# Patient Record
Sex: Male | Born: 1980 | Race: Black or African American | Hispanic: No | Marital: Single | Smoking: Current every day smoker
Health system: Southern US, Community
[De-identification: ages and names within clinical notes are randomized; demographics above are authoritative.]

## PROBLEM LIST (undated history)

## (undated) DIAGNOSIS — F209 Schizophrenia, unspecified: Secondary | ICD-10-CM

## (undated) HISTORY — PX: OTHER SURGICAL HISTORY: SHX169

---

## 2006-05-18 ENCOUNTER — Emergency Department (HOSPITAL_COMMUNITY): Admission: EM | Admit: 2006-05-18 | Discharge: 2006-05-18 | Payer: Self-pay | Admitting: Emergency Medicine

## 2010-03-13 ENCOUNTER — Emergency Department (HOSPITAL_COMMUNITY)
Admission: EM | Admit: 2010-03-13 | Discharge: 2010-03-14 | Disposition: A | Discharge status: Home / Self Care | Payer: Self-pay | Attending: Emergency Medicine | Admitting: Emergency Medicine

## 2010-03-13 DIAGNOSIS — H5500 Unspecified nystagmus: Secondary | ICD-10-CM | POA: Insufficient documentation

## 2010-03-13 DIAGNOSIS — T43505A Adverse effect of unspecified antipsychotics and neuroleptics, initial encounter: Secondary | ICD-10-CM | POA: Insufficient documentation

## 2010-03-13 DIAGNOSIS — F209 Schizophrenia, unspecified: Secondary | ICD-10-CM | POA: Insufficient documentation

## 2010-03-13 DIAGNOSIS — Z982 Presence of cerebrospinal fluid drainage device: Secondary | ICD-10-CM | POA: Insufficient documentation

## 2010-03-13 DIAGNOSIS — T443X5A Adverse effect of other parasympatholytics [anticholinergics and antimuscarinics] and spasmolytics, initial encounter: Secondary | ICD-10-CM | POA: Insufficient documentation

## 2010-03-13 DIAGNOSIS — R259 Unspecified abnormal involuntary movements: Secondary | ICD-10-CM | POA: Insufficient documentation

## 2010-03-13 DIAGNOSIS — G259 Extrapyramidal and movement disorder, unspecified: Secondary | ICD-10-CM | POA: Insufficient documentation

## 2010-03-14 LAB — POCT I-STAT, CHEM 8
Calcium, Ion: 1.07 mmol/L — ABNORMAL LOW (ref 1.12–1.32)
Creatinine, Ser: 0.9 mg/dL (ref 0.4–1.5)
Glucose, Bld: 102 mg/dL — ABNORMAL HIGH (ref 70–99)
Hemoglobin: 14.6 g/dL (ref 13.0–17.0)
TCO2: 24 mmol/L (ref 0–100)

## 2010-03-15 ENCOUNTER — Emergency Department (HOSPITAL_COMMUNITY)
Admission: EM | Admit: 2010-03-15 | Discharge: 2010-03-16 | Disposition: A | Discharge status: Other Acute Inpatient Hospital | Payer: Self-pay | Attending: Emergency Medicine | Admitting: Emergency Medicine

## 2010-03-15 DIAGNOSIS — F200 Paranoid schizophrenia: Secondary | ICD-10-CM

## 2010-03-15 DIAGNOSIS — F2081 Schizophreniform disorder: Secondary | ICD-10-CM

## 2010-03-15 DIAGNOSIS — F209 Schizophrenia, unspecified: Secondary | ICD-10-CM | POA: Insufficient documentation

## 2010-03-15 DIAGNOSIS — R269 Unspecified abnormalities of gait and mobility: Secondary | ICD-10-CM | POA: Insufficient documentation

## 2010-03-15 DIAGNOSIS — I10 Essential (primary) hypertension: Secondary | ICD-10-CM | POA: Insufficient documentation

## 2010-03-15 DIAGNOSIS — R259 Unspecified abnormal involuntary movements: Secondary | ICD-10-CM | POA: Insufficient documentation

## 2010-03-15 LAB — URINALYSIS, ROUTINE W REFLEX MICROSCOPIC
Bilirubin Urine: NEGATIVE
Nitrite: NEGATIVE
Protein, ur: NEGATIVE mg/dL
Specific Gravity, Urine: 1.019 (ref 1.005–1.030)
Urobilinogen, UA: 1 mg/dL (ref 0.0–1.0)

## 2010-03-15 LAB — RAPID URINE DRUG SCREEN, HOSP PERFORMED
Amphetamines: NOT DETECTED
Opiates: NOT DETECTED
Tetrahydrocannabinol: NOT DETECTED

## 2010-03-15 LAB — DIFFERENTIAL
Basophils Absolute: 0.1 10*3/uL (ref 0.0–0.1)
Basophils Relative: 1 % (ref 0–1)
Eosinophils Relative: 2 % (ref 0–5)
Monocytes Absolute: 0.7 10*3/uL (ref 0.1–1.0)
Monocytes Relative: 9 % (ref 3–12)

## 2010-03-15 LAB — BASIC METABOLIC PANEL
Calcium: 9 mg/dL (ref 8.4–10.5)
Creatinine, Ser: 1 mg/dL (ref 0.4–1.5)
GFR calc Af Amer: >60 mL/min (ref >60)
GFR calc non Af Amer: >60 mL/min (ref >60)
Glucose, Bld: 94 mg/dL (ref 70–99)
Sodium: 138 mEq/L (ref 135–145)

## 2010-03-15 LAB — CBC
MCH: 26.4 pg (ref 26.0–34.0)
MCHC: 36.3 g/dL — ABNORMAL HIGH (ref 30.0–36.0)
RDW: 15.5 % (ref 11.5–15.5)

## 2010-03-16 ENCOUNTER — Inpatient Hospital Stay (HOSPITAL_COMMUNITY)
Admission: AD | Admit: 2010-03-16 | Discharge: 2010-03-19 | DRG: 885 | Disposition: A | Discharge status: Home / Self Care | Payer: PRIVATE HEALTH INSURANCE | Source: Ambulatory Visit | Attending: Psychiatry | Admitting: Psychiatry

## 2010-03-16 DIAGNOSIS — G2401 Drug induced subacute dyskinesia: Secondary | ICD-10-CM

## 2010-03-16 DIAGNOSIS — D649 Anemia, unspecified: Secondary | ICD-10-CM

## 2010-03-16 DIAGNOSIS — F205 Residual schizophrenia: Principal | ICD-10-CM

## 2010-03-16 DIAGNOSIS — G911 Obstructive hydrocephalus: Secondary | ICD-10-CM

## 2010-03-16 DIAGNOSIS — T43505A Adverse effect of unspecified antipsychotics and neuroleptics, initial encounter: Secondary | ICD-10-CM

## 2010-03-17 DIAGNOSIS — F209 Schizophrenia, unspecified: Secondary | ICD-10-CM

## 2010-03-25 NOTE — H&P (Signed)
[name], GENET NO.:  [id number]  MEDICAL RECORD NO.:  [id number]           PATIENT TYPE:  I  LOCATION:  0406                          FACILITY:  [facility]  PHYSICIAN:  Eulogio Ditch, MD DATE OF BIRTH:  [birth year]  DATE OF ADMISSION:  03/16/2010 DATE OF DISCHARGE:                      PSYCHIATRIC ADMISSION ASSESSMENT   IDENTIFYING INFORMATION:  A 30 year old male, single.  This is a voluntary admission.  HISTORY OF PRESENT ILLNESS:  .  First [facility] admission for [name] who was brought to the emergency room by his father who was concerned about twitching movements that he had been having for several days.  It is unclear exactly when it started but he has had some dystonia in the past on his current Risperdal and has responded well to Ativan.  He apparently had stopped taking the Ativan a couple weeks ago and then at this time he has twitching and jittering movements.  His father has been very concerned about permanent tardive dyskinesia.  Murice himself was complaining of being very hungry and has a mild headache which resolved in the emergency room with food.  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  PAST PSYCHIATRIC HISTORY:  Currently followed as an outpatient for schizophrenia at [facility].  He has a history of previous admission to [facility] in January 2004.  SOCIAL HISTORY:  Single African American male. Never married.  No children.  Lives with supportive parents and siblings who also monitor his medications.  FAMILY HISTORY:  Not available.  ALCOHOL AND DRUG HISTORY:  No evidence of substance abuse.  MEDICAL HISTORY:  Primary care physician is unknown.  MEDICAL PROBLEMS:  History of hydrocephalus with a VP shunt last known to be revised around 1997, per the record.  CURRENT MEDICATIONS:  Risperdal 4 mg and Cogentin 1 mg daily.  DRUG ALLERGIES:  None.  PHYSICAL EXAM:  Was done  in the emergency room and noted in the record. There was no evidence of shunt problems when he was examined. Today his AIM score is zero.  His balance is good even with challenge.  He is somewhat unsteady in tandem gait. Strength exam reveals equal grip. No rigidity.  No cogwheeling.  Proprioception is intact. Repetitive movements are smooth and intact.  Romberg is without findings.  Neuro is nonfocal.  His full physical exam was done at [facility] emergency room.  This is a slight built [place] American male 5 feet 10 inches tall, 65 kg.  CBC: WBC 7.2, hemoglobin 13.6, hematocrit 37.5, platelets 317,000. MCV is 72.8.  Electrolytes are normal. BUN 9, creatinine 1.0, negative urine drug screen and urinalysis.  MENTAL STATUS EXAM:  This is a slightly drowsy male whom I greeted in the day room where he was watching TV.  He said his leg was asleep and initially he limped a little bit so we sat and talked for a few minutes and cognitively he is fully oriented.  Speech is nonpressured.  He asked if he was going to be going home on his current Ativan and Risperdal combination.  Questions are appropriate.  Thinking is relatively logical.  He is oriented to person, place and situation.  No dangerous thoughts and no abnormal movements noted today. AXIS I:  Rule out schizophrenia. AXIS II:  No diagnosis. AXIS III:  Microcytic anemia. AXIS IV:  Deferred.  Stable home and supportive family is an asset. AXIS V:  Current is 32. Past year not known.  PLAN:  Admit him voluntarily to our stabilization unit.  We will get some additional history from his family before we proceed with an anemia panel.  This appears to be a chronic finding.  Meanwhile we have started him on Ativan and will give him 0.5 mg t.i.d. now that the 1 mg t.i.d. appears to be causing a little bit of drowsiness. His parents are coming today to visit and we will hope to get some feedback from them.     Margaret A. Lorin Picket,  N.P.   ______________________________ Eulogio Ditch, MD    MAS/MEDQ  D:  03/17/2010  T:  03/17/2010  Job:  [id number]  Electronically Signed by Kari Baars N.P. on 03/19/2010 02:28:39 PM Electronically Signed by Eulogio Ditch  on 03/25/2010 06:07:43 AM

## 2010-03-28 NOTE — Discharge Summary (Signed)
[name], [name]                ACCOUNT NO.:  [id number]  MEDICAL RECORD NO.:  [id number]           PATIENT TYPE:  I  LOCATION:  0400                          FACILITY:  [facility]  PHYSICIAN:  Eulogio Ditch, MD DATE OF BIRTH:  [birth year]  DATE OF ADMISSION:  03/16/2010 DATE OF DISCHARGE:  03/19/2010                              DISCHARGE SUMMARY   IDENTIFYING INFORMATION:  This is a 30 year old single male.  This is a voluntary admission.  HISTORY OF PRESENT ILLNESS:  First [facility] admission for [name], who was brought to the emergency room by his father, who was concerned about twitching movements that he had for several days.  It is unclear exactly when it started, but he reports a problem with some dystonia in the past, and his father was concerned that he might be displaying symptoms of permanent tardive dyskinesia.  He has a history of schizophrenia and hydrocephalous with a VP shunt last revised in or around 1997.  He has had some dystonia in the past which had responded well to Ativan.  He is currently followed as an outpatient by [facility]. Medical evaluation, diagnostic studies, full physical exam was done in the emergency room, where consult was done by our attending psychiatrist, Dr. Eulogio Ditch.  He was noted to have some nystagmus with gaze shift in the left eye and occasional rapid twitching of his hands and feet.  He was started on Ativan in the emergency room and responded well.  He was transferred to [facility] for observation.  Admitting mental status exam revealed a fully alert male, cooperative, though rather involutional.  He was oriented to person, place, and situation.  He responded to all questions asked and was cooperative but did not offer much information.  Speech is soft in tone, barely audible at times.  He was cooperative with the neuro exam.  He was noted to be mildly drowsy after having been started on Ativan 1  mg t.i.d. and had received a dose about 15 minutes prior to the exam.  Up and ambulatory with no abnormal movements.  No cogwheeling.  No motor stiffness.  Proprioception intact.  Rapid alternating movements intact. No signs of tremor or dystonia.  __________ is 0.  COURSE OF HOSPITALIZATION:  He was admitted to our stabilization unit, and we decreased the Ativan to 0.5 mg b.i.d. and continued his home medication of risperidone 4 mg daily.  We discontinued the Cogentin in order to alleviate any possible dystonia.  While here on the unit he was alert, cooperative, good appetite, slept well, and had no further signs of usual movement.  He was discharged to return home with his parents on February 13.  DISCHARGE/PLAN:  Follow up at the [facility] on February 16 at 9:00 a.m.  DISCHARGE MEDICATIONS:  Risperdal 4 mg p.o. daily and lorazepam 0.5 mg twice daily and at bedtime.  He was instructed to discontinue the Cogentin.  DISCHARGE DIAGNOSES:  Schizophrenia, chronic.  AXIS II:  No diagnosis. AXIS III:  No diagnosis.  AXIS IV:  Deferred.  AXIS V:  Current is 58, past year not  known.  DISCHARGE CONDITION:  Stable.     Margaret A. Lorin Picket, N.P.   ______________________________ Eulogio Ditch, MD    MAS/MEDQ  D:  03/20/2010  T:  03/20/2010  Job:  [id number]  Electronically Signed by Kari Baars N.P. on 03/26/2010 12:09:02 PM Electronically Signed by Eulogio Ditch  on 03/28/2010 04:47:55 AM

## 2010-08-24 ENCOUNTER — Emergency Department (HOSPITAL_COMMUNITY)
Admission: EM | Admit: 2010-08-24 | Discharge: 2010-08-25 | Disposition: A | Discharge status: Other Acute Inpatient Hospital | Payer: Self-pay | Attending: Emergency Medicine | Admitting: Emergency Medicine

## 2010-08-24 DIAGNOSIS — R4182 Altered mental status, unspecified: Secondary | ICD-10-CM | POA: Insufficient documentation

## 2010-08-24 DIAGNOSIS — F29 Unspecified psychosis not due to a substance or known physiological condition: Secondary | ICD-10-CM | POA: Insufficient documentation

## 2010-08-24 DIAGNOSIS — IMO0002 Reserved for concepts with insufficient information to code with codable children: Secondary | ICD-10-CM | POA: Insufficient documentation

## 2010-08-24 DIAGNOSIS — Z79899 Other long term (current) drug therapy: Secondary | ICD-10-CM | POA: Insufficient documentation

## 2010-08-24 LAB — CBC
Hemoglobin: 14 g/dL (ref 13.0–17.0)
Platelets: 357 10*3/uL (ref 150–400)
RBC: 5.3 MIL/uL (ref 4.22–5.81)
WBC: 8.2 10*3/uL (ref 4.0–10.5)

## 2010-08-24 LAB — DIFFERENTIAL
Basophils Absolute: 0.1 10*3/uL (ref 0.0–0.1)
Eosinophils Relative: 2 % (ref 0–5)
Lymphocytes Relative: 29 % (ref 12–46)
Lymphs Abs: 2.4 10*3/uL (ref 0.7–4.0)
Monocytes Relative: 14 % — ABNORMAL HIGH (ref 3–12)
Neutrophils Relative %: 54 % (ref 43–77)

## 2010-08-24 LAB — COMPREHENSIVE METABOLIC PANEL
ALT: 16 U/L (ref 0–53)
AST: 44 U/L — ABNORMAL HIGH (ref 0–37)
Alkaline Phosphatase: 72 U/L (ref 39–117)
CO2: 25 mEq/L (ref 19–32)
Calcium: 9.4 mg/dL (ref 8.4–10.5)
Chloride: 103 mEq/L (ref 96–112)
GFR calc Af Amer: >60 mL/min (ref >60)
GFR calc non Af Amer: >60 mL/min (ref >60)
Glucose, Bld: 121 mg/dL — ABNORMAL HIGH (ref 70–99)
Potassium: 3.5 mEq/L (ref 3.5–5.1)
Sodium: 139 mEq/L (ref 135–145)

## 2010-08-25 ENCOUNTER — Inpatient Hospital Stay (HOSPITAL_COMMUNITY)
Admission: AD | Admit: 2010-08-25 | Discharge: 2010-09-03 | DRG: 885 | Disposition: A | Discharge status: Home / Self Care | Payer: Self-pay | Attending: Psychiatry | Admitting: Psychiatry

## 2010-08-25 DIAGNOSIS — G911 Obstructive hydrocephalus: Secondary | ICD-10-CM

## 2010-08-25 DIAGNOSIS — T434X5A Adverse effect of butyrophenone and thiothixene neuroleptics, initial encounter: Secondary | ICD-10-CM

## 2010-08-25 DIAGNOSIS — G2401 Drug induced subacute dyskinesia: Secondary | ICD-10-CM

## 2010-08-25 DIAGNOSIS — H5500 Unspecified nystagmus: Secondary | ICD-10-CM

## 2010-08-25 DIAGNOSIS — F205 Residual schizophrenia: Principal | ICD-10-CM

## 2010-08-25 DIAGNOSIS — Z6379 Other stressful life events affecting family and household: Secondary | ICD-10-CM

## 2010-08-25 LAB — RAPID URINE DRUG SCREEN, HOSP PERFORMED
Barbiturates: NOT DETECTED
Cocaine: NOT DETECTED
Tetrahydrocannabinol: NOT DETECTED

## 2010-08-26 DIAGNOSIS — F2081 Schizophreniform disorder: Secondary | ICD-10-CM

## 2010-08-27 LAB — CLOSTRIDIUM DIFFICILE BY PCR: Toxigenic C. Difficile by PCR: NEGATIVE

## 2010-09-03 NOTE — Assessment & Plan Note (Signed)
[name], PICOU NO.:  [id number]  MEDICAL RECORD NO.:  [id number]  LOCATION:                                 FACILITY:  PHYSICIAN:  Eulogio Ditch, MD DATE OF BIRTH:  Oct 19, 1980  DATE OF ADMISSION: DATE OF DISCHARGE:                      PSYCHIATRIC ADMISSION ASSESSMENT   This is an involuntary admission to the services of Dr. Rogers Blocker.  This is a 30 year old single [place] male.  He was brought to the emergency room for increasingly psychotic behavior.  Apparently he could not engage during his assessment; he just provided a blank stare.  He is known to have schizophrenia.  He has been admitted here in the past. His father apparently provided most of the information as the patient lives with him.  Apparently they have been weaning him off of Risperdal due to side effects although they do not know exactly say what side effects he was having but he has started wandering.  He has a flat affect.  The father states he is unable to keep the patient safe in "this state of mind."  The commitment papers indicate that he was found wandering into the neighbors yard.  He will not respond when asked questions and he has a blank stare.  He did acknowledge that he was hearing voices.  He does appear to be responding to internal stimuli and it was felt that he required inpatient hospitalization for stabilization.  PAST PSYCHIATRIC HISTORY:  He was admitted to the then [facility] from March 04, 2001, to March 04, 2002.  He has been followed as an outpatient at [facility] which is now [facility] since that time.  SOCIAL HISTORY:  Unobtainable, apparently he lives with his father.  He is not known to be a substance abuser.  There is on the paternal side of the family a history for alcohol.  MEDICAL PROBLEMS:  He has a history for hydrocephalous, nystagmus, low vision.  Also today his labs are abnormal.  His glucose was  elevated at 121.  His total protein is rising; it is 8.4 and he did have an elevation in SGOT to 44.  UDS was completely negative.  He had no other abnormalities of CBC.  DRUG ALLERGIES:  No known drug allergies.  PHYSICAL FINDINGS:  He was medically cleared in the ED at [facility] He was afebrile, 99 was his temperature.  Pulse was 104.  Respirations 20.  Blood pressure was 111/73.  MENTAL STATUS EXAM:  He had finally responded to some Ativan that was given to him.  He was sleeping.  I will just go by his ED report and his assessment.  Apparently he understands what is being asked of him.  He was somewhat unkempt.  His eye contact was fair.  He was alert but his affect was flat.  He would not respond to questions.  He did appear to be responding to internal stimuli.  DIAGNOSES:  AXIS I:  Schizophrenia, undifferentiated type. AXIS II:  None. AXIS III: 1. Hydrocephalous. 2. Nystagmus. 3. Low vision. AXIS IV:  Med changes thought to be causing mental status changes. AXIS V:  25.  PLAN:  Admit for safety  and stabilization.  As he is so psychotic at this time, we will give him Haldol 2 mg p.o. b.i.d., 5 mg at h.s. for his psychosis.  We will continue his Cogentin 1 mg in the a.m. and h.s. to preclude EPS and we will even now order for Haldol 5 mg, Ativan 2 mg IM or p.o. x1 for agitation, then call M.D. for further orders.  ESTIMATED LENGTH OF STAY:  Unknown.     Mickie Leonarda Salon, P.A.-C.   ______________________________ Eulogio Ditch, MD    MD/MEDQ  D:  08/26/2010  T:  08/26/2010  Job:  [id number]  Electronically Signed by Jaci Lazier ADAMS P.A.-C. on 09/02/2010 12:19:53 PM Electronically Signed by Eulogio Ditch  on 09/03/2010 05:07:47 PM

## 2010-09-13 NOTE — Discharge Summary (Signed)
Solis, Tyler NO.:  [id number]  MEDICAL RECORD NO.:  [id number]  LOCATION:  0407                          FACILITY:  [facility]  PHYSICIAN:  Eulogio Ditch, MD DATE OF BIRTH:  08/04/1980  DATE OF ADMISSION:  08/25/2010 DATE OF DISCHARGE:  09/03/2010                              DISCHARGE SUMMARY   IDENTIFYING INFORMATION:  This is a 30 year old African American male. This was an involuntary admission.  HISTORY OF PRESENT ILLNESS:  [name] presented on an involuntary petition.  His father was worried about him because he was becoming unresponsive to questions, staring for long periods of time with long response latency.  He appeared to be internally distracted and was not taking care of himself.  His father reported that he had also wandered off into the neighbors' yards and was having jerking motions of his body.  His father was afraid that he was unable to keep him safe. [name] has a history of schizophrenia paranoid type.  In assessment, he exhibited a lot of negative symptoms of schizophrenia but did not express any dangerous thoughts.  No aggression or signs of wanting to harm himself or anyone else.  He had recently had his medications changed at [facility] who had given him instructions on starting on oral Abilify, and he had also been taking some Risperdal and oral Vistaril.  MEDICAL EVALUATION AND DIAGNOSTIC STUDIES:  He was medically evaluated in the [facility] Emergency Room where he made eye contact but appeared to not be able to register instructions properly.  He had difficulty with basic activities such as removing his clothing  and needed quite a bit of prompting but was nonaggressive.  He is a slim built [place] male that was showing some tonoclonic twitching of his upper body according to his father, but this was not recorded in the emergency room.  Urine drug screen was negative for all substances.  CBC with  a normal hemoglobin 14.  Alcohol less than 11.  Comprehensive metabolic panel revealed normal electrolytes, BUN 14, creatinine 0.91.  Liver enzymes:  SGOT 44, SGPT 16, alkaline phosphatase 72 and total bilirubin 0.6.  Alcohol screen was negative.  Admitting vital signs:  He had a temperature of 99, pulse 104, respirations 20, blood pressure 111/73. He responded well to Ativan, relaxed and slept well.  COURSE OF HOSPITALIZATION:  He was admitted to our acute stabilization unit and given a provisional diagnosis of schizophrenia, undifferentiated type, acute exacerbation and on Axis III a history of hydrocephalus.  He was gradually assimilated into the milieu, and we elected to discontinue his previous medications and start him on Haldol 2 mg p.o. b.i.d. and 5 mg at bedtime for his psychosis and Cogentin 1 mg in the morning and at bedtime for possible EPS.  We also made available Ativan 2 mg p.o. or IM for any episodes of agitation.  By the 23rd of July, he was still displaying disorganized thinking and behavior quite guarded in manner and affect.  He appeared to be internally distracted. We elected to change his Haldol to 10 mg nightly on July 24th.  Our case manager spoke with his father who said  that they were pursuing disability with an attorney on behalf of [name].  His father again reported the sudden jerking usually on the left side of his upper body that would occur spontaneously and unpredictably and also reported he had had a lot of thought blocking at home.  On the 26th, [name] displayed similar symptoms with erratic tonoclonic jerking of his upper torso that would occur spontaneously.  He had decreased eye blink but no cogwheeling and no muscle rigidity.  At that point, we decreased his Haldol to 5 mg nightly, added Benadryl 50 mg nightly, and Klonopin 0.5 mg x1 and then every evening at bedtime.  The jerking movements resolved immediately and did not recur.  His sleep  markedly improved to the 6-1/2 hours a night.  He continued to be quite withdrawn.  He did not initiate much conversation.  His eye contact was good.  He was appropriate in group settings.  Appetite was good, and his response latency resolved. He was attending to his personal hygiene and appeared to be at baseline. Parents were in agreement that he was ready to come home by the 30th. At that point, [name] was doing well and denied hearing auditory hallucinations.  Speech was more fluent.  Motor movements were smooth with no problem.  He was not sedated.  Insight and judgment improved though he continued to have a lot of negative symptoms.  His participation in group activities was satisfactory.  He was attentive.  DISCHARGE DIAGNOSES:  Axis I:  Schizophrenia, undifferentiated type, chronic, acute exacerbation. Axis II:  No diagnosis. Axis III:  Rule out neuroleptic induced dyskinesia, resolved. Axis IV:  Deferred. Axis V:  Current GAF 55, past year not known.  DISCHARGE PLAN:  Follow up with [facility] on July 31st at 12 noon.  DISCHARGE CONDITION:  Stable.  DISCHARGE MEDICATIONS: 1. Clonazepam 0.5 mg nightly. 2. Diphenhydramine 50 mg nightly. 3. Haldol 5 mg nightly.     Margaret A. Lorin Picket, N.P.   ______________________________ Eulogio Ditch, MD    MAS/MEDQ  D:  09/04/2010  T:  09/04/2010  Job:  [id number]  Electronically Signed by Kari Baars N.P. on 09/06/2010 08:17:19 AM Electronically Signed by Eulogio Ditch  on 09/13/2010 08:51:35 AM

## 2017-05-07 ENCOUNTER — Other Ambulatory Visit: Payer: Self-pay

## 2017-05-07 ENCOUNTER — Emergency Department (HOSPITAL_COMMUNITY)
Admission: EM | Admit: 2017-05-07 | Discharge: 2017-05-07 | Disposition: A | Discharge status: Home / Self Care | Payer: Medicaid Other | Attending: Emergency Medicine | Admitting: Emergency Medicine

## 2017-05-07 ENCOUNTER — Emergency Department (HOSPITAL_COMMUNITY): Payer: Medicaid Other

## 2017-05-07 ENCOUNTER — Encounter (HOSPITAL_COMMUNITY): Payer: Self-pay

## 2017-05-07 DIAGNOSIS — M545 Low back pain, unspecified: Secondary | ICD-10-CM

## 2017-05-07 DIAGNOSIS — L03311 Cellulitis of abdominal wall: Secondary | ICD-10-CM

## 2017-05-07 DIAGNOSIS — F17290 Nicotine dependence, other tobacco product, uncomplicated: Secondary | ICD-10-CM | POA: Diagnosis not present

## 2017-05-07 DIAGNOSIS — Z23 Encounter for immunization: Secondary | ICD-10-CM | POA: Insufficient documentation

## 2017-05-07 HISTORY — DX: Schizophrenia, unspecified: F20.9

## 2017-05-07 MED ORDER — IBUPROFEN 600 MG PO TABS: 600.0000 mg | ORAL_TABLET | Freq: Four times a day (QID) | ORAL | 0 refills | Status: DC | PRN

## 2017-05-07 MED ORDER — DOXYCYCLINE HYCLATE 100 MG PO CAPS: 100.0000 mg | ORAL_CAPSULE | Freq: Two times a day (BID) | ORAL | 0 refills | Status: DC

## 2017-05-07 MED ORDER — TETANUS-DIPHTH-ACELL PERTUSSIS 5-2.5-18.5 LF-MCG/0.5 IM SUSP
0.5000 mL | Freq: Once | INTRAMUSCULAR | Status: AC
  Administered 2017-05-07: 0.5 mL via INTRAMUSCULAR
  Filled 2017-05-07: qty 0.5

## 2017-05-07 NOTE — Discharge Instructions (Signed)
Take ibuprofen as needed for your back pain.  Take doxycycline for skin infection.  Follow-up with your doctor for further care.

## 2017-05-07 NOTE — ED Triage Notes (Addendum)
Patient c/o right knee, mid and lower back pain x 1 month. Patient is currently wearing a back brace. Patient states he  Lifted his mother and hurt his back at that time. MAE.

## 2017-05-07 NOTE — ED Provider Notes (Signed)
[facility]-EMERGENCY DEPT Provider Note   CSN: [id number] Arrival date & time: 05/07/17  1330     History   Chief Complaint Chief Complaint  Patient presents with  . Back Pain  . Knee Pain    HPI [name] is a 37 y.o. male.  HPI   37 year old male with history of schizophrenia presenting complaining of chronic back pain.  Patient mentioned for the past 3-4 months he has had pain to his mid and lower back.  Pain is described as a pinching sharp nonradiating pain that is waxing and waning, sometimes worsening with movement with smoking a cigarette.  He has never had this problem evaluate before enzymes come here for further evaluation.  Pain is minimal at this time.  No associated injury that he can recall except lifting his mom sometime in January or December when the pain appears.  At that time he also complaining of bilateral knee pain which is minimal at this time.  He wears a back brace for support.  Furthermore, patient also noticed tenderness to his lower abdomen with a skin rash oozing out pus and blood for the past several weeks.  Pain is described as a soreness, mild to moderate without any urinary symptoms.  No fever no testicular pain no penile pain.  Denies any injury.  No specific treatment tried.  Unsure last tetanus.  Past Medical History:  Diagnosis Date  . Schizophrenia (HCC)     There are no active problems to display for this patient.    The histories are not reviewed yet. Please review them in the "History" navigator section and refresh this SmartLink.      Home Medications    Prior to Admission medications   Medication Sig Start Date End Date Taking? Authorizing Provider  haloperidol (HALDOL) 5 MG tablet Take 5 mg by mouth once.   Yes [provider]    Family History Family History  Problem Relation Age of Onset  . Stroke Mother   . Thyroid disease Father     Social History Social History   Tobacco Use  .  Smoking status: Current Every Day Smoker    Types: Cigars  . Smokeless tobacco: Never Used  Substance Use Topics  . Alcohol use: Not Currently  . Drug use: Not Currently    Types: Marijuana     Allergies   Patient has no known allergies.   Review of Systems Review of Systems  All other systems reviewed and are negative.    Physical Exam Updated Vital Signs BP 130/85 (BP Location: Right Arm)   Pulse (!) 101   Temp 98.2 F (36.8 C) (Oral)   Resp 18   Ht 6' (1.829 m)   Wt 81.6 kg (180 lb)   SpO2 99%   BMI 24.41 kg/m   Physical Exam  Constitutional: He appears well-developed and well-nourished. No distress.  HENT:  Head: Atraumatic.  Eyes: Conjunctivae are normal.  Neck: Normal range of motion. Neck supple.  Musculoskeletal: He exhibits tenderness (Mild tenderness to lumbar paraspinal spinal muscle on palpation.  Back with full range of motion and no deformity.  Negative straight leg raise.).  Neurological: He is alert.  Skin: Rash (To the mid anterior lower abdomen right above the pubic symphysis are multiple areas of mild induration with serous discharge and surrounding skin erythema without any obvious fluctuant amenable for incision and drainage.  It is tender to palpation.) noted.  Psychiatric: He has a normal mood and  affect.     ED Treatments / Results  Labs (all labs ordered are listed, but only abnormal results are displayed) Labs Reviewed - No data to display  EKG None  Radiology Dg Lumbar Spine Complete  Result Date: 05/07/2017 CLINICAL DATA:  Mid low back pain without acute injury, symptoms for 3 months EXAM: LUMBAR SPINE - COMPLETE 4+ VIEW COMPARISON:  None. FINDINGS: Transitional S1 vertebra based on the lowest ribs. There is a rudimentary disc space at S1-2. No evidence of fracture or malalignment. Good disc height preservation. No evidence of facet degeneration. Stool and gas seen throughout the visualized colonic segments. Tubing ending at the  pelvis, usually VP shunt. IMPRESSION: 1. Negative lumbar spine. 2. Gas and stool throughout the visualized colon. Electronically Signed   By: Marnee SpringJonathon  Watts M.D.   On: 05/07/2017 16:21    Procedures Procedures (including critical care time)  Medications Ordered in ED Medications  Tdap (BOOSTRIX) injection 0.5 mL (0.5 mLs Intramuscular Given 05/07/17 1523)     Initial Impression / Assessment and Plan / ED Course  I have reviewed the triage vital signs and the nursing notes.  Pertinent labs & imaging results that were available during my care of the patient were reviewed by me and considered in my medical decision making (see chart for details).     BP 130/85 (BP Location: Right Arm)   Pulse (!) 101   Temp 98.2 F (36.8 C) (Oral)   Resp 18   Ht 6' (1.829 m)   Wt 81.6 kg (180 lb)   SpO2 99%   BMI 24.41 kg/m    Final Clinical Impressions(s) / ED Diagnoses   Final diagnoses:  Recurrent low back pain  Cellulitis, abdominal wall    ED Discharge Orders        Ordered    ibuprofen (ADVIL,MOTRIN) 600 MG tablet  Every 6 hours PRN     05/07/17 1637    doxycycline (VIBRAMYCIN) 100 MG capsule  2 times daily     05/07/17 1637     3:09 PM Patient here with recurrent lower back pain.  Minimal tenderness on exam.  Due to the prolonged course of his back pain, will obtain lumbar x-ray.  Furthermore, he also has area of skin infection involving his lower abdominal and pelvic wall using a small amount of serous discharge.  He will benefit from antibiotic, doxycycline.  No obvious abscess amenable for incision and drainage.  Will update tetanus. 4:34 PM L-spine x-ray unremarkable.  Reassurance given.  Patient discharged home with doxycycline for cellulitis.  Return precautions discussed.   Fayrene Helperran, Basim Bartnik, PA-C 05/07/17 1638    Shaune PollackIsaacs, Cameron, MD 05/08/17 [phone]

## 2020-04-26 ENCOUNTER — Other Ambulatory Visit (HOSPITAL_COMMUNITY): Payer: Self-pay | Admitting: Family Medicine

## 2020-04-26 DIAGNOSIS — M25561 Pain in right knee: Secondary | ICD-10-CM

## 2020-05-05 ENCOUNTER — Encounter (HOSPITAL_COMMUNITY): Payer: Self-pay

## 2020-05-05 ENCOUNTER — Other Ambulatory Visit (HOSPITAL_COMMUNITY): Payer: Medicaid Other

## 2020-05-31 ENCOUNTER — Other Ambulatory Visit (HOSPITAL_COMMUNITY): Payer: Self-pay | Admitting: Family Medicine

## 2020-05-31 ENCOUNTER — Encounter (HOSPITAL_COMMUNITY): Payer: Self-pay

## 2020-05-31 ENCOUNTER — Ambulatory Visit (HOSPITAL_COMMUNITY): Payer: Medicaid Other

## 2020-05-31 DIAGNOSIS — M25561 Pain in right knee: Secondary | ICD-10-CM

## 2020-06-09 ENCOUNTER — Emergency Department (HOSPITAL_COMMUNITY)
Admission: EM | Admit: 2020-06-09 | Discharge: 2020-06-09 | Disposition: A | Discharge status: Home / Self Care | Payer: Medicaid Other | Attending: Emergency Medicine | Admitting: Emergency Medicine

## 2020-06-09 ENCOUNTER — Emergency Department (HOSPITAL_COMMUNITY): Payer: Medicaid Other

## 2020-06-09 ENCOUNTER — Other Ambulatory Visit: Payer: Self-pay

## 2020-06-09 ENCOUNTER — Encounter (HOSPITAL_COMMUNITY): Payer: Self-pay | Admitting: *Deleted

## 2020-06-09 DIAGNOSIS — E876 Hypokalemia: Secondary | ICD-10-CM

## 2020-06-09 DIAGNOSIS — W19XXXA Unspecified fall, initial encounter: Secondary | ICD-10-CM | POA: Insufficient documentation

## 2020-06-09 DIAGNOSIS — R220 Localized swelling, mass and lump, head: Secondary | ICD-10-CM | POA: Insufficient documentation

## 2020-06-09 DIAGNOSIS — F17290 Nicotine dependence, other tobacco product, uncomplicated: Secondary | ICD-10-CM | POA: Insufficient documentation

## 2020-06-09 DIAGNOSIS — R55 Syncope and collapse: Secondary | ICD-10-CM | POA: Diagnosis present

## 2020-06-09 LAB — COMPREHENSIVE METABOLIC PANEL
ALT: 9 U/L (ref 0–44)
AST: 16 U/L (ref 15–41)
Albumin: 3 g/dL — ABNORMAL LOW (ref 3.5–5.0)
Alkaline Phosphatase: 67 U/L (ref 38–126)
Anion gap: 8 (ref 5–15)
BUN: <5 mg/dL — ABNORMAL LOW (ref 6–20)
CO2: 29 mmol/L (ref 22–32)
Calcium: 8.6 mg/dL — ABNORMAL LOW (ref 8.9–10.3)
Chloride: 96 mmol/L — ABNORMAL LOW (ref 98–111)
Creatinine, Ser: 0.69 mg/dL (ref 0.61–1.24)
GFR, Estimated: >60 mL/min (ref >60)
Glucose, Bld: 124 mg/dL — ABNORMAL HIGH (ref 70–99)
Potassium: 2.5 mmol/L — CL (ref 3.5–5.1)
Sodium: 133 mmol/L — ABNORMAL LOW (ref 135–145)
Total Bilirubin: 0.4 mg/dL (ref 0.3–1.2)
Total Protein: 7.5 g/dL (ref 6.5–8.1)

## 2020-06-09 LAB — CBC WITH DIFFERENTIAL/PLATELET
Abs Immature Granulocytes: 0.24 10*3/uL — ABNORMAL HIGH (ref 0.00–0.07)
Basophils Absolute: 0.1 10*3/uL (ref 0.0–0.1)
Basophils Relative: 1 %
Eosinophils Absolute: 0.2 10*3/uL (ref 0.0–0.5)
Eosinophils Relative: 1 %
HCT: 36.6 % — ABNORMAL LOW (ref 39.0–52.0)
Hemoglobin: 12.3 g/dL — ABNORMAL LOW (ref 13.0–17.0)
Immature Granulocytes: 2 %
Lymphocytes Relative: 11 %
Lymphs Abs: 1.5 10*3/uL (ref 0.7–4.0)
MCH: 24.5 pg — ABNORMAL LOW (ref 26.0–34.0)
MCHC: 33.6 g/dL (ref 30.0–36.0)
MCV: 72.9 fL — ABNORMAL LOW (ref 80.0–100.0)
Monocytes Absolute: 1 10*3/uL (ref 0.1–1.0)
Monocytes Relative: 7 %
Neutro Abs: 11.1 10*3/uL — ABNORMAL HIGH (ref 1.7–7.7)
Neutrophils Relative %: 78 %
Platelets: 531 10*3/uL — ABNORMAL HIGH (ref 150–400)
RBC: 5.02 MIL/uL (ref 4.22–5.81)
RDW: 15.5 % (ref 11.5–15.5)
WBC: 14 10*3/uL — ABNORMAL HIGH (ref 4.0–10.5)
nRBC: 0 % (ref 0.0–0.2)

## 2020-06-09 LAB — CK: Total CK: 326 U/L (ref 49–397)

## 2020-06-09 LAB — MAGNESIUM: Magnesium: 1.7 mg/dL (ref 1.7–2.4)

## 2020-06-09 MED ORDER — SODIUM CHLORIDE 0.9 % IV BOLUS
1000.0000 mL | Freq: Once | INTRAVENOUS | Status: AC
  Administered 2020-06-09: 1000 mL via INTRAVENOUS

## 2020-06-09 MED ORDER — POTASSIUM CHLORIDE CRYS ER 20 MEQ PO TBCR
40.0000 meq | EXTENDED_RELEASE_TABLET | Freq: Once | ORAL | Status: AC
  Administered 2020-06-09: 40 meq via ORAL
  Filled 2020-06-09: qty 2

## 2020-06-09 MED ORDER — POTASSIUM CHLORIDE CRYS ER 20 MEQ PO TBCR: 20.0000 meq | EXTENDED_RELEASE_TABLET | Freq: Every day | ORAL | 0 refills | Status: DC

## 2020-06-09 NOTE — ED Notes (Signed)
Low potassium called from lab  Dr Rhunette Croft notified

## 2020-06-09 NOTE — Discharge Instructions (Addendum)
You are seen in the ER after he had a fainting spell.  Work-up in the ER is overall reassuring.  We are not sure why you had a fainting spell.  Your shunt series looks okay.  The scalp of your head does have some left-sided swelling, which we had noted on eye exam as well.  It is unclear why you have the swelling.  Discussed this with your primary care doctor to see if further work-up is needed.  Shunt series itself looked fine.  We have given you phone number for neurosurgery for you to follow-up.  It is possible that you might have had a seizure, follow-up with neurology for that evaluation.

## 2020-06-09 NOTE — ED Provider Notes (Signed)
[facility] EMERGENCY DEPARTMENT Provider Note   CSN: [id number] Arrival date & time: 06/09/20  1526     History Chief Complaint  Patient presents with  . Near Syncope    [name] is a 40 y.o. male.  HPI     40 year old comes in a chief complaint of fainting like spell.  Patient has history of schizophrenia and VP shunt placement.  Patient here with father.  Patient had a fall earlier today.  He reports he was outside, smoking cigarette.  He started getting dizzy and then fell down.  He is unsure if he fainted, but father reports that when he went outside patient was rigid and had a blank stare.  He was rigid, that episode lasted for 2 minutes.  No seizure-like activity, no incontinence.  Patient was back to baseline normal within minutes.  Additionally, patient also has a shunt that has not been seen by [facility] for several years.  Last revision was 2012.  Patient's father reports that the swelling over the left scalp is worse, but its not new.  Patient also has been having ataxia for the last several months, recently was started on doxycycline and his ataxia is actually better.  Patient has no headache, neck pain.  He agrees that his balance is little off, but that it is better with doxycycline.  He has no new numbness, tingling, weakness, vision change or dizziness.  Past Medical History:  Diagnosis Date  . Schizophrenia (HCC)     There are no problems to display for this patient.   Past Surgical History:  Procedure Laterality Date  . shunt surgery         Family History  Problem Relation Age of Onset  . Stroke Mother   . Thyroid disease Father     Social History   Tobacco Use  . Smoking status: Current Every Day Smoker    Types: Cigars  . Smokeless tobacco: Never Used  Vaping Use  . Vaping Use: Former  Substance Use Topics  . Alcohol use: Not Currently  . Drug use: Not Currently    Types: Marijuana    Home Medications Prior to  Admission medications   Medication Sig Start Date End Date Taking? Authorizing Provider  acetaminophen (TYLENOL) 325 MG tablet Take 325-650 mg by mouth every 6 (six) hours as needed for mild pain (or headaches).   Yes [provider]  doxycycline (VIBRAMYCIN) 100 MG capsule Take 1 capsule (100 mg total) by mouth 2 (two) times daily. One po bid x 7 days Patient taking differently: Take 100 mg by mouth 2 (two) times daily. 05/07/17  Yes Fayrene Helper, PA-C  haloperidol (HALDOL) 5 MG tablet Take 5 mg by mouth at bedtime.   Yes [provider]  ibuprofen (ADVIL) 200 MG tablet Take 200 mg by mouth every 6 (six) hours as needed for mild pain (or headaches).   Yes [provider]  ibuprofen (ADVIL,MOTRIN) 600 MG tablet Take 1 tablet (600 mg total) by mouth every 6 (six) hours as needed. Patient not taking: Reported on 06/09/2020 05/07/17   Fayrene Helper, PA-C    Allergies    Cheese and Risperdal [risperidone]  Review of Systems   Review of Systems  Constitutional: Positive for activity change.  Neurological: Positive for syncope.  All other systems reviewed and are negative.   Physical Exam Updated Vital Signs BP (!) 139/99   Pulse (!) 102   Temp (!) 97.5 F (36.4 C) (Oral)  Resp (!) 22   Ht 5\' 11"  (1.803 m)   Wt 79.8 kg   SpO2 95%   BMI 24.55 kg/m   Physical Exam Vitals and nursing note reviewed.  Constitutional:      Appearance: He is well-developed.  HENT:     Head: Atraumatic.  Eyes:     Pupils: Pupils are equal, round, and reactive to light.     Comments: Nystagmus with left-sided gaze  Cardiovascular:     Rate and Rhythm: Normal rate.  Pulmonary:     Effort: Pulmonary effort is normal.  Musculoskeletal:     Cervical back: Neck supple.  Skin:    General: Skin is warm.     Comments: Left scalp edema  Neurological:     General: No focal deficit present.     Mental Status: He is alert and oriented to person, place, and time.     Cranial Nerves: No  cranial nerve deficit.     Sensory: No sensory deficit.     Motor: No weakness.     Coordination: Coordination normal.     Gait: Gait normal.     Deep Tendon Reflexes: Reflexes normal.     ED Results / Procedures / Treatments   Labs (all labs ordered are listed, but only abnormal results are displayed) Labs Reviewed  CBC WITH DIFFERENTIAL/PLATELET - Abnormal; Notable for the following components:      Result Value   WBC 14.0 (*)    Hemoglobin 12.3 (*)    HCT 36.6 (*)    MCV 72.9 (*)    MCH 24.5 (*)    Platelets 531 (*)    Neutro Abs 11.1 (*)    Abs Immature Granulocytes 0.24 (*)    All other components within normal limits  COMPREHENSIVE METABOLIC PANEL - Abnormal; Notable for the following components:   Sodium 133 (*)    Potassium 2.5 (*)    Chloride 96 (*)    Glucose, Bld 124 (*)    BUN <5 (*)    Calcium 8.6 (*)    Albumin 3.0 (*)    All other components within normal limits  MAGNESIUM  CK    EKG EKG Interpretation  Date/Time:  Friday Jun 09 2020 16:49:22 EDT Ventricular Rate:  96 PR Interval:  161 QRS Duration: 93 QT Interval:  373 QTC Calculation: 472 R Axis:   87 Text Interpretation: Sinus rhythm No acute changes No old tracing to compare Confirmed by 05-20-2001 [phone]) on 06/09/2020 6:28:10 PM   Radiology DG Skull 1-3 Views  Result Date: 06/09/2020 CLINICAL DATA:  Evaluate for shunt malfunction. EXAM: SKULL - 1-3 VIEW COMPARISON:  May 05, 2020 FINDINGS: There is no evidence of skull fracture or other focal bone lesions. Stable left-sided ventriculoperitoneal shunt positioning is noted with the distal tip of the ventriculostomy catheter positioned within the left parietal region, to the left of midline. Intact shunt tubing is seen. IMPRESSION: Negative. Electronically Signed   By: May 07, 2020 M.D.   On: 06/09/2020 18:34   DG Chest 1 View  Result Date: 06/09/2020 CLINICAL DATA:  Evaluate for shunt malfunction. EXAM: CHEST  1 VIEW COMPARISON:  None.  FINDINGS: The heart size and mediastinal contours are within normal limits. Intact ventriculoperitoneal shunt tubing is seen overlying the medial aspect of the left lung. Both lungs are clear. The visualized skeletal structures are unremarkable. IMPRESSION: No active disease. Electronically Signed   By: 08/09/2020 M.D.   On: 06/09/2020 18:35   DG Abd  1 View  Result Date: 06/09/2020 CLINICAL DATA:  Evaluate for shunt malfunction. EXAM: ABDOMEN - 1 VIEW COMPARISON:  None. FINDINGS: The bowel gas pattern is normal. A large amount of stool is seen throughout the colon. Intact ventriculoperitoneal shunt tubing is seen within the abdomen and pelvis. Its distal tip is noted within the right lower quadrant. No radio-opaque calculi or other significant radiographic abnormality are seen. IMPRESSION: 1. Large stool burden without evidence of bowel obstruction. 2. Intact ventriculoperitoneal shunt tubing. Electronically Signed   By: Aram Candela M.D.   On: 06/09/2020 18:36   CT Head Wo Contrast  Result Date: 06/09/2020 CLINICAL DATA:  Ataxia. Trauma. Syncopal episode. Head pain. Chronic shunt. EXAM: CT HEAD WITHOUT CONTRAST CT CERVICAL SPINE WITHOUT CONTRAST TECHNIQUE: Multidetector CT imaging of the head and cervical spine was performed following the standard protocol without intravenous contrast. Multiplanar CT image reconstructions of the cervical spine were also generated. COMPARISON:  None. 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 A left parietal ventriculostomy catheter extends to the base of the cyst. Ventricles are otherwise normal in size. No acute infarct, hemorrhage, or mass lesion is present. No significant extraaxial fluid collection is present. The brainstem and cerebellum are within normal limits. Vascular: No hyperdense vessel or unexpected calcification. Skull: Left parietal burr hole present for the ventriculostomy catheter. Calvarium otherwise  intact. Diffuse scalp soft tissue thickening is present. This does not appear traumatic. No focal injury evident. Sinuses/Orbits: Mild mucosal thickening present in the inferior maxillary sinuses bilaterally. There is scattered mucosal thickening within ethmoid air cells. No fluid levels are present. The globes and orbits are within normal limits. CT CERVICAL SPINE FINDINGS Alignment: No significant listhesis is present. There is some straightening of the normal cervical lordosis. This may be positional as the patient is an a hard collar. Skull base and vertebrae: Craniocervical junction is normal. Mild degenerative changes present C1-2. Vertebral body heights are normal. No acute or healing fractures are present. Soft tissues and spinal canal: No prevertebral fluid or swelling. No visible canal hematoma. Disc levels: No significant focal disc disease or stenosis is evident. Upper chest: Lung apices are clear. IMPRESSION: 1. Diffuse scalp soft tissue thickening without underlying fracture. This does not appear traumatic. Question inflammatory process. 2. 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 3. Left parietal ventriculostomy catheter extends to the base of the cyst. Ventricles are otherwise normal in size. 4. No acute intracranial abnormality or significant interval change. 5. No acute fracture or traumatic subluxation in the cervical spine. Electronically Signed   By: Marin Roberts M.D.   On: 06/09/2020 18:29   CT Cervical Spine Wo Contrast  Result Date: 06/09/2020 CLINICAL DATA:  Ataxia. Trauma. Syncopal episode. Head pain. Chronic shunt. EXAM: CT HEAD WITHOUT CONTRAST CT CERVICAL SPINE WITHOUT CONTRAST TECHNIQUE: Multidetector CT imaging of the head and cervical spine was performed following the standard protocol without intravenous contrast. Multiplanar CT image reconstructions of the cervical spine were also generated. COMPARISON:  None. 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 A left parietal ventriculostomy catheter extends to the base of the cyst. Ventricles are otherwise normal in size. No acute infarct, hemorrhage, or mass lesion is present. No significant extraaxial fluid collection is present. The brainstem and cerebellum are within normal limits. Vascular: No hyperdense vessel or unexpected calcification. Skull: Left parietal burr hole present for the ventriculostomy catheter. Calvarium otherwise intact. Diffuse scalp soft tissue thickening  is present. This does not appear traumatic. No focal injury evident. Sinuses/Orbits: Mild mucosal thickening present in the inferior maxillary sinuses bilaterally. There is scattered mucosal thickening within ethmoid air cells. No fluid levels are present. The globes and orbits are within normal limits. CT CERVICAL SPINE FINDINGS Alignment: No significant listhesis is present. There is some straightening of the normal cervical lordosis. This may be positional as the patient is an a hard collar. Skull base and vertebrae: Craniocervical junction is normal. Mild degenerative changes present C1-2. Vertebral body heights are normal. No acute or healing fractures are present. Soft tissues and spinal canal: No prevertebral fluid or swelling. No visible canal hematoma. Disc levels: No significant focal disc disease or stenosis is evident. Upper chest: Lung apices are clear. IMPRESSION: 1. Diffuse scalp soft tissue thickening without underlying fracture. This does not appear traumatic. Question inflammatory process. 2. 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 3. Left parietal ventriculostomy catheter extends to the base of the cyst. Ventricles are otherwise normal in size. 4. No acute intracranial abnormality or significant interval change. 5. No acute fracture or traumatic subluxation in the cervical spine. Electronically Signed   By: Marin Robertshristopher  Mattern M.D.   On:  06/09/2020 18:29    Procedures Procedures   Medications Ordered in ED Medications  potassium chloride SA (KLOR-CON) CR tablet 40 mEq (has no administration in time range)  sodium chloride 0.9 % bolus 1,000 mL (0 mLs Intravenous Stopped 06/09/20 1719)    ED Course  I have reviewed the triage vital signs and the nursing notes.  Pertinent labs & imaging results that were available during my care of the patient were reviewed by me and considered in my medical decision making (see chart for details).    MDM Rules/Calculators/A&P                          40 year old comes in a chief complaint of fall.  Questionable syncope versus seizure -absent tach.  Has VP shunt in place  There is some swelling to the scalp.  CT shunt series ordered, with his ataxia there is no evidence of new hydrocephalus and patient scalp looks edematous, but there is no clear evidence of infection.  He is already on doxycycline.  Will advise PCP follow-up for that.  Patient's potassium is slightly low.  He will be replenished with oral potassium here and discharged with some more.  EKG is not showing any QT prolongation.  Telemetry monitoring has been normal.  Questionable absence seizure, neuro follow-up requested.  Neurosurgery information also provided so that patient can start getting his routine care.  Final Clinical Impression(s) / ED Diagnoses Final diagnoses:  Syncope and collapse  Superficial swelling of scalp    Rx / DC Orders ED Discharge Orders    None       Derwood KaplanNanavati, Krystalynn Ridgeway, MD 06/09/20 1857

## 2020-06-09 NOTE — ED Triage Notes (Signed)
The pt arrived by gems the pt had a syncope episode  Just prior to arrival  C/o nausea head pain  He has a shunt in his head since he was 40 yrs old  Iv per ems zofran 4 mg given in ems cbg 155

## 2020-06-09 NOTE — ED Notes (Signed)
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

## 2020-06-09 NOTE — ED Notes (Signed)
Patient transported to CT 

## 2020-06-13 ENCOUNTER — Ambulatory Visit (HOSPITAL_COMMUNITY): Payer: Medicaid Other

## 2020-06-19 ENCOUNTER — Ambulatory Visit (HOSPITAL_COMMUNITY): Payer: Medicaid Other

## 2020-06-21 ENCOUNTER — Ambulatory Visit (HOSPITAL_COMMUNITY)
Admission: RE | Admit: 2020-06-21 | Discharge: 2020-06-21 | Disposition: A | Discharge status: Home / Self Care | Payer: Medicaid Other | Source: Ambulatory Visit | Attending: Family Medicine | Admitting: Family Medicine

## 2020-06-21 ENCOUNTER — Other Ambulatory Visit: Payer: Self-pay

## 2020-06-21 DIAGNOSIS — M25561 Pain in right knee: Secondary | ICD-10-CM | POA: Insufficient documentation

## 2020-09-11 ENCOUNTER — Other Ambulatory Visit: Payer: Self-pay

## 2020-09-11 ENCOUNTER — Emergency Department (HOSPITAL_COMMUNITY)
Admission: EM | Admit: 2020-09-11 | Discharge: 2020-09-12 | Disposition: A | Discharge status: Home / Self Care | Payer: Medicaid Other | Attending: Student | Admitting: Student

## 2020-09-11 DIAGNOSIS — T887XXA Unspecified adverse effect of drug or medicament, initial encounter: Secondary | ICD-10-CM | POA: Diagnosis not present

## 2020-09-11 DIAGNOSIS — T43505A Adverse effect of unspecified antipsychotics and neuroleptics, initial encounter: Secondary | ICD-10-CM | POA: Insufficient documentation

## 2020-09-11 DIAGNOSIS — G2401 Drug induced subacute dyskinesia: Secondary | ICD-10-CM | POA: Insufficient documentation

## 2020-09-11 DIAGNOSIS — Z5321 Procedure and treatment not carried out due to patient leaving prior to being seen by health care provider: Secondary | ICD-10-CM | POA: Diagnosis not present

## 2020-09-11 NOTE — ED Provider Notes (Signed)
Emergency Medicine Provider Triage Evaluation Note  [name] , a 39 y.o. male  was evaluated in triage.  Pt complains of tardive dyskinesia that began today after restarting haloperidol yesterday. Happened 10 years ago with Risperdal.   Review of Systems  Positive: Extra movements, mostly UEs Negative:  Pain, dizziness, syncop or palpitation  Physical Exam  BP 135/90 (BP Location: Left Arm)   Pulse 88   Temp 98 F (36.7 C) (Oral)   Resp 16   SpO2 97%  Gen:   Awake, no distress   Resp:  Normal effort  MSK:   Moves extremities without difficulty Other:    Medical Decision Making  Medically screening exam initiated at 10:48 PM.  Appropriate orders placed.  [name] was informed that the remainder of the evaluation will be completed by another provider, this initial triage assessment does not replace that evaluation, and the importance of remaining in the ED until their evaluation is complete.    [name] 09/11/20 2302    Jacalyn Lefevre, MD 09/12/20 1735

## 2020-09-11 NOTE — ED Triage Notes (Signed)
Pt has hx of tardive dyskinesia.  Pt used to take respiredone and now takes haldol 5 mg at night.  Pt had taken himself off of the medication because he didn't like how it made him feel.  When he started back on the haldol last night began "hitting himself"

## 2020-09-12 LAB — I-STAT CHEM 8, ED
BUN: <3 mg/dL — ABNORMAL LOW (ref 6–20)
Calcium, Ion: 1.17 mmol/L (ref 1.15–1.40)
Chloride: 95 mmol/L — ABNORMAL LOW (ref 98–111)
Creatinine, Ser: 0.6 mg/dL — ABNORMAL LOW (ref 0.61–1.24)
Glucose, Bld: 108 mg/dL — ABNORMAL HIGH (ref 70–99)
HCT: 40 % (ref 39.0–52.0)
Hemoglobin: 13.6 g/dL (ref 13.0–17.0)
Potassium: 3.5 mmol/L (ref 3.5–5.1)
Sodium: 136 mmol/L (ref 135–145)
TCO2: 32 mmol/L (ref 22–32)

## 2020-09-12 LAB — CK: Total CK: 108 U/L (ref 49–397)

## 2020-09-12 NOTE — ED Notes (Signed)
Called Pt for vitals no answer. 

## 2020-11-28 ENCOUNTER — Emergency Department (HOSPITAL_COMMUNITY): Payer: Medicaid Other

## 2020-11-28 ENCOUNTER — Emergency Department (HOSPITAL_COMMUNITY)
Admission: EM | Admit: 2020-11-28 | Discharge: 2020-11-28 | Disposition: A | Discharge status: Home / Self Care | Payer: Medicaid Other | Attending: Emergency Medicine | Admitting: Emergency Medicine

## 2020-11-28 ENCOUNTER — Encounter (HOSPITAL_COMMUNITY): Payer: Self-pay | Admitting: Emergency Medicine

## 2020-11-28 DIAGNOSIS — Y900 Blood alcohol level of less than 20 mg/100 ml: Secondary | ICD-10-CM | POA: Diagnosis not present

## 2020-11-28 DIAGNOSIS — F209 Schizophrenia, unspecified: Secondary | ICD-10-CM | POA: Diagnosis not present

## 2020-11-28 DIAGNOSIS — F17290 Nicotine dependence, other tobacco product, uncomplicated: Secondary | ICD-10-CM | POA: Insufficient documentation

## 2020-11-28 DIAGNOSIS — R4689 Other symptoms and signs involving appearance and behavior: Secondary | ICD-10-CM

## 2020-11-28 DIAGNOSIS — Z79899 Other long term (current) drug therapy: Secondary | ICD-10-CM | POA: Diagnosis not present

## 2020-11-28 DIAGNOSIS — T85618A Breakdown (mechanical) of other specified internal prosthetic devices, implants and grafts, initial encounter: Secondary | ICD-10-CM

## 2020-11-28 DIAGNOSIS — R1110 Vomiting, unspecified: Secondary | ICD-10-CM | POA: Insufficient documentation

## 2020-11-28 DIAGNOSIS — R456 Violent behavior: Secondary | ICD-10-CM | POA: Insufficient documentation

## 2020-11-28 DIAGNOSIS — Y848 Other medical procedures as the cause of abnormal reaction of the patient, or of later complication, without mention of misadventure at the time of the procedure: Secondary | ICD-10-CM | POA: Insufficient documentation

## 2020-11-28 DIAGNOSIS — T8501XA Breakdown (mechanical) of ventricular intracranial (communicating) shunt, initial encounter: Secondary | ICD-10-CM | POA: Insufficient documentation

## 2020-11-28 LAB — COMPREHENSIVE METABOLIC PANEL
ALT: 11 U/L (ref 0–44)
AST: 13 U/L — ABNORMAL LOW (ref 15–41)
Albumin: 3.4 g/dL — ABNORMAL LOW (ref 3.5–5.0)
Alkaline Phosphatase: 93 U/L (ref 38–126)
Anion gap: 6 (ref 5–15)
BUN: <5 mg/dL — ABNORMAL LOW (ref 6–20)
CO2: 28 mmol/L (ref 22–32)
Calcium: 8.8 mg/dL — ABNORMAL LOW (ref 8.9–10.3)
Chloride: 102 mmol/L (ref 98–111)
Creatinine, Ser: 0.79 mg/dL (ref 0.61–1.24)
GFR, Estimated: >60 mL/min (ref >60)
Glucose, Bld: 110 mg/dL — ABNORMAL HIGH (ref 70–99)
Potassium: 3.7 mmol/L (ref 3.5–5.1)
Sodium: 136 mmol/L (ref 135–145)
Total Bilirubin: 0.6 mg/dL (ref 0.3–1.2)
Total Protein: 8 g/dL (ref 6.5–8.1)

## 2020-11-28 LAB — CBC
HCT: 39.6 % (ref 39.0–52.0)
Hemoglobin: 13.4 g/dL (ref 13.0–17.0)
MCH: 24.2 pg — ABNORMAL LOW (ref 26.0–34.0)
MCHC: 33.8 g/dL (ref 30.0–36.0)
MCV: 71.5 fL — ABNORMAL LOW (ref 80.0–100.0)
Platelets: 503 10*3/uL — ABNORMAL HIGH (ref 150–400)
RBC: 5.54 MIL/uL (ref 4.22–5.81)
RDW: 14.6 % (ref 11.5–15.5)
WBC: 6.8 10*3/uL (ref 4.0–10.5)
nRBC: 0 % (ref 0.0–0.2)

## 2020-11-28 LAB — ETHANOL: Alcohol, Ethyl (B): <10 mg/dL (ref <10)

## 2020-11-28 LAB — SALICYLATE LEVEL: Salicylate Lvl: <7 mg/dL — ABNORMAL LOW (ref 7.0–30.0)

## 2020-11-28 LAB — LIPASE, BLOOD: Lipase: 24 U/L (ref 11–51)

## 2020-11-28 LAB — ACETAMINOPHEN LEVEL: Acetaminophen (Tylenol), Serum: <10 ug/mL — ABNORMAL LOW (ref 10–30)

## 2020-11-28 NOTE — Discharge Instructions (Addendum)
You were seen in the emergency department today for nausea and symptoms related to your psychiatric medication.  We did lab work today and did not find an acute cause for your nausea.  The imaging of your shunt was normal.   Our psychiatry provider recommended that you follow-up with your outpatient provider and continue taking your current prescribed medication.  I have also attached some resources for other providers in the area free to call and make an appointment if you are unhappy with your current care.  Continue to monitor how you're doing and return to the ER for new or worsening symptoms such as increased aggression, or concerns for safety.   It has been a pleasure seeing and caring for you today and I hope you start feeling better soon!

## 2020-11-28 NOTE — ED Provider Notes (Signed)
[facility] EMERGENCY DEPARTMENT Provider Note   CSN: [id number] Arrival date & time: 11/28/20  1056     History Chief Complaint  Patient presents with   Schizophrenia    [name] is a 40 y.o. male with history of schizophrenia and acquired hydrocephalus s/p VP shunt placement who is complaining of vomiting, restless and agitation. Father at bedside states patient's dose of haldol was cut in 2:30 months ago.  He reports patient has been more restless and agitated, with more tardive dyskinesia.  Reports patient drinks several sodas last night, and had 4 episodes of emesis.  He also notes that patient's gait has been slightly changed recently as well.   HPI     Past Medical History:  Diagnosis Date   Schizophrenia (HCC)     There are no problems to display for this patient.   Past Surgical History:  Procedure Laterality Date   shunt surgery         Family History  Problem Relation Age of Onset   Stroke Mother    Thyroid disease Father     Social History   Tobacco Use   Smoking status: Every Day    Types: Cigars   Smokeless tobacco: Never  Vaping Use   Vaping Use: Former  Substance Use Topics   Alcohol use: Not Currently   Drug use: Not Currently    Types: Marijuana    Home Medications Prior to Admission medications   Medication Sig Start Date End Date Taking? Authorizing Provider  acetaminophen (TYLENOL) 325 MG tablet Take 325-650 mg by mouth every 6 (six) hours as needed for mild pain (or headaches).    [provider]  doxycycline (VIBRAMYCIN) 100 MG capsule Take 1 capsule (100 mg total) by mouth 2 (two) times daily. One po bid x 7 days Patient taking differently: Take 100 mg by mouth 2 (two) times daily. 05/07/17   Fayrene Helper, PA-C  haloperidol (HALDOL) 5 MG tablet Take 5 mg by mouth at bedtime.    [provider]  ibuprofen (ADVIL) 200 MG tablet Take 200 mg by mouth every 6 (six) hours as needed for mild pain  (or headaches).    [provider]  ibuprofen (ADVIL,MOTRIN) 600 MG tablet Take 1 tablet (600 mg total) by mouth every 6 (six) hours as needed. Patient not taking: Reported on 06/09/2020 05/07/17   Fayrene Helper, PA-C  potassium chloride SA (KLOR-CON) 20 MEQ tablet Take 1 tablet (20 mEq total) by mouth daily. 06/09/20   Derwood Kaplan, MD    Allergies    Cheese and Risperdal [risperidone]  Review of Systems   Review of Systems  Constitutional:  Negative for chills and fever.  HENT:  Negative for sore throat.   Respiratory:  Negative for cough and shortness of breath.   Cardiovascular:  Negative for chest pain.  Gastrointestinal:  Positive for nausea and vomiting. Negative for abdominal pain, constipation and diarrhea.  Musculoskeletal:  Positive for gait problem.  Neurological:  Positive for tremors. Negative for dizziness, light-headedness and headaches.       Tardive dyskinesia of lips and head  Psychiatric/Behavioral:  Positive for agitation. The patient is hyperactive.   All other systems reviewed and are negative.  Physical Exam Updated Vital Signs BP 126/88   Pulse (!) 112   Temp 98.2 F (36.8 C) (Oral)   Resp 14   SpO2 94%   Physical Exam Vitals and nursing note reviewed.  Constitutional:  Appearance: Normal appearance.  HENT:     Head: Normocephalic and atraumatic.  Eyes:     Conjunctiva/sclera: Conjunctivae normal.  Pulmonary:     Effort: Pulmonary effort is normal. No respiratory distress.  Skin:    General: Skin is warm and dry.  Neurological:     Mental Status: He is alert.     Comments: Neuro: Speech is clear, able to follow commands. CN III-XII intact grossly intact. PERRLA. EOMI. Sensation intact throughout. Str 5/5 all extremities.   Psychiatric:        Attention and Perception: He does not perceive auditory or visual hallucinations.        Mood and Affect: Mood normal. Affect is flat.        Speech: Speech normal.        Behavior: Behavior  normal.        Thought Content: Thought content does not include homicidal or suicidal ideation.    ED Results / Procedures / Treatments   Labs (all labs ordered are listed, but only abnormal results are displayed) Labs Reviewed  COMPREHENSIVE METABOLIC PANEL - Abnormal; Notable for the following components:      Result Value   Glucose, Bld 110 (*)    BUN <5 (*)    Calcium 8.8 (*)    Albumin 3.4 (*)    AST 13 (*)    All other components within normal limits  CBC - Abnormal; Notable for the following components:   MCV 71.5 (*)    MCH 24.2 (*)    Platelets 503 (*)    All other components within normal limits  SALICYLATE LEVEL - Abnormal; Notable for the following components:   Salicylate Lvl <7.0 (*)    All other components within normal limits  ACETAMINOPHEN LEVEL - Abnormal; Notable for the following components:   Acetaminophen (Tylenol), Serum <10 (*)    All other components within normal limits  LIPASE, BLOOD  ETHANOL  URINALYSIS, ROUTINE W REFLEX MICROSCOPIC  RAPID URINE DRUG SCREEN, HOSP PERFORMED    EKG None  Radiology DG Skull 1-3 Views  Result Date: 11/28/2020 CLINICAL DATA:  Restlessness EXAM: SKULL - 1-3 VIEW COMPARISON:  Jun 10, 2018 FINDINGS: There is no evidence of skull fracture or other focal bone lesions. Left-sided ventriculoperitoneal shunt is unchanged. The shunt tubing is intact. Visualized paranasal sinuses are clear. IMPRESSION: Negative. Electronically Signed   By: Larose Hires D.O.   On: 11/28/2020 12:41   DG Chest 1 View  Result Date: 11/28/2020 CLINICAL DATA:  Evaluate VP shunt EXAM: CHEST  1 VIEW; ABDOMEN - 1 VIEW COMPARISON:  06/09/2020 FINDINGS: A VP shunt catheter is seen traversing the left chest wall and abdomen and is coiled within the midline of the pelvis. No kinking or discontinuity. Abandoned shunt tubing is partially visualized at the base of the neck on the left, and within the left chest and abdomen. Heart size is normal. Lungs are  clear. Bowel gas pattern is normal. Moderate volume of stool throughout the colon. No acute bony findings. IMPRESSION: 1. Intact VP shunt catheter within the chest and abdomen. 2. No acute cardiopulmonary findings. 3. Moderate colonic stool burden. Electronically Signed   By: Duanne Guess D.O.   On: 11/28/2020 12:38   DG Cervical Spine 1 View  Result Date: 11/28/2020 CLINICAL DATA:  Shunt malfunction EXAM: DG CERVICAL SPINE - 1 VIEW COMPARISON:  Cervical spine CT 06/09/2020 FINDINGS: Intact shunt catheter tubing is seen projecting over the left facial bones, neck, and left  hemithorax. This tubing appears intact, with no kink or break. The upper and lower end of the tubing are not seen on this study. Additional fracture fragments are seen projecting over the left neck and medial left hemithorax, unchanged compared to prior studies. IMPRESSION: The imaged portions of the shunt tubing projecting over the left face, neck, and hemithorax are unchanged. Additional presumably abandoned catheter fragment projecting over the left neck and medial left hemithorax are unchanged. Electronically Signed   By: Lesia Hausen M.D.   On: 11/28/2020 12:34   DG Abd 1 View  Result Date: 11/28/2020 CLINICAL DATA:  Evaluate VP shunt EXAM: CHEST  1 VIEW; ABDOMEN - 1 VIEW COMPARISON:  06/09/2020 FINDINGS: A VP shunt catheter is seen traversing the left chest wall and abdomen and is coiled within the midline of the pelvis. No kinking or discontinuity. Abandoned shunt tubing is partially visualized at the base of the neck on the left, and within the left chest and abdomen. Heart size is normal. Lungs are clear. Bowel gas pattern is normal. Moderate volume of stool throughout the colon. No acute bony findings. IMPRESSION: 1. Intact VP shunt catheter within the chest and abdomen. 2. No acute cardiopulmonary findings. 3. Moderate colonic stool burden. Electronically Signed   By: Duanne Guess D.O.   On: 11/28/2020 12:38   CT  HEAD WO CONTRAST ( )  Result Date: 11/28/2020 CLINICAL DATA:  Hydrocephalus.  Although mental status. EXAM: CT HEAD WITHOUT CONTRAST TECHNIQUE: Contiguous axial images were obtained from the base of the skull through the vertex without intravenous contrast. FINDINGS: Brain: 06/09/2020 Vascular: There is no evidence for acute hemorrhage, hydrocephalus, or mass lesion. No definite CT evidence for acute infarction. 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 Skull: Normal. Negative for fracture or focal lesion. Sinuses/Orbits: The visualized paranasal sinuses and mastoid air cells are clear. Visualized portions of the globes and intraorbital fat are unremarkable. Other: None. IMPRESSION: Stable.  No new or acute intracranial abnormality. Electronically Signed   By: Kennith Center M.D.   On: 11/28/2020 14:30    Procedures Procedures   Medications Ordered in ED Medications - No data to display  ED Course  I have reviewed the triage vital signs and the nursing notes.  Pertinent labs & imaging results that were available during my care of the patient were reviewed by me and considered in my medical decision making (see chart for details).    MDM Rules/Calculators/A&P                           Patient is 40 y/o male with history of schizophrenia and acquired hydrocephalus s/p VP shunt who presents with vomiting, restlessness, agitation and gait change. 4 episodes of non-bloody non-bilious emesis last night after patient drank several sugary drinks. Father states dose of haldol changed about 2 months ago, and patient has had more tardive dyskinesia and schizophrenia symptoms including unprovoked aggression. There was an episode last night and in which he was acting aggressive towards his niece.  Father does not have acute concerns for safety, but states he does not feel comfortable just following up with patient's normal outpatient  psychiatric provider.  On exam patient is in no acute distress.  No neurologic deficits as above.  Affect is flat.  Patient denies SI, HI, visual or auditory hallucinations.  XR shunt series shows in place VP shunt. CT head stable compared to prior. Symptoms  are not likely related to VP shunt failure.  Father states gait problem is actually improved compared to prior.  Patient is not exhibiting signs of acute psychotic episode.  Patient is medically cleared at this time. Plan to consult TTS for schizophrenia with increased aggression.  5:45pm Consulted with psychiatry NP who recommended patient follow-up with outpatient provider.  Psychiatry provider sees no acute reason for psychiatric admission today.  Plan to continue current prescribed medication, provide alternative outpatient psychiatric resources.  Discussed reasons to return to the emergency department.  Patient and father agreeable to plan.  Final Clinical Impression(s) / ED Diagnoses Final diagnoses:  Shunt malfunction  Aggression    Rx / DC Orders ED Discharge Orders     None        Pericles Carmicheal T, PA-C 11/28/20 1800    Virgina Norfolk, DO 11/28/20 2344

## 2020-11-28 NOTE — ED Provider Notes (Signed)
Emergency Medicine Provider Triage Evaluation Note  [name] , a 40 y.o. male  was evaluated in triage.  Pt complains of vomiting, restlessness, agitation for several days. Hx of schizophrenia on Haldol, dosage recently cut in half 2 months ago. Father at bedside states patient has had a change in his gait and more agitation lately. 4 episodes of emesis last night. Hx of VP shunt, father has concerns about malfunction.   Review of Systems  Positive: Vomiting, headache Negative: CP, SOB, abdominal pain  Physical Exam  BP 119/85 (BP Location: Right Arm)   Pulse (!) 113   Temp 98.2 F (36.8 C) (Oral)   Resp 16   SpO2 97%  Gen:   Awake, no distress   Resp:  Normal effort  MSK:   Moves extremities without difficulty  Other:  Tardive dyskinesia on exam  Medical Decision Making  Medically screening exam initiated at 11:32 AM.  Appropriate orders placed.  [name] was informed that the remainder of the evaluation will be completed by another provider, this initial triage assessment does not replace that evaluation, and the importance of remaining in the ED until their evaluation is complete.     Su Monks, PA-C 11/28/20 1134    Virgina Norfolk, DO 11/28/20 1325

## 2020-11-28 NOTE — BH Assessment (Addendum)
Comprehensive Clinical Assessment (CCA) Note  11/28/2020 [name] [id number]  DISPOSITION: Per Ophelia Shoulder, NP, pt is psych cleared. NP will reach out to the EDP to discuss medical aspects of pt's sx. Pt recommended to follow-up with his current OP provider for medication adjustments. [name] RN advised of recommendation and she was asked to advise the EDP.   The patient demonstrates the following risk factors for suicide: Chronic risk factors for suicide include: psychiatric disorder of schizophrenia . Acute risk factors for suicide include: unemployment and social withdrawal/isolation. Protective factors for this patient include: positive social support and hope for the future. Considering these factors, the overall suicide risk at this point appears to be low. Patient is appropriate for outpatient follow up.  Flowsheet Row ED from 11/28/2020 in [facility] EMERGENCY DEPARTMENT ED from 09/11/2020 in [facility] EMERGENCY DEPARTMENT ED from 06/09/2020 in [facility] EMERGENCY DEPARTMENT  C-SSRS RISK CATEGORY No Risk No Risk Error: Question 2 not populated      Pt is a 40 yo male who presented voluntarily and accompanied by his father, [name]. Father participated in the assessment with pt's permission. Hx of schizophrenia. Father stated that due to Tardive Dyskinesia, pt's medications (primarily Haldol) were cut in half 2 to 3 months ago. Recently, per father pt has been vomiting, restless and agitated and not eating or drinking well. Pt [facility]. Pt does not and has not had a therapist and stated he has been taking the medications as they were prescribed. Pt denied SI, HI, NSSH, AVH, paranoia and drug/alcohol use. Pt reported 1 psych admission in 2018 or 2018 for psychosis. Pt is single and lives with his father and a niece. He has no children and no siblings. Pt attended some  college with no problems or changes Pt reported he sleeps 8-9 hours per night and he appetite is returning.   Chief Complaint:  Chief Complaint  Patient presents with   Schizophrenia   Visit Diagnosis:  Schizophrenia    CCA Screening, Triage and Referral (STR)  Patient Reported Information How did you hear about [place]? Family/Friend  What Is the Reason for Your Visit/Call Today? Pt is a 40 yo male who presented voluntarily and accompanied by his father, [name]. Father participated in the assessment with pt's permission. Hx of schizophrenia. Father stated that due to Tardive Dyskinesia, pt's medications (primarily Haldol) were cut in half 2 to 3 months ago. Recently, per father pt has been vomiting, restless and agitated and not eating or drinking well. Pt is prescribed medications by a doctor at the [facility]. Pt does not and has not had a therapist and stated he has been taking the medications as they were prescribed. Pt denied SI, HI, NSSH, AVH, paranoia and drug/alcohol use. Pt reported 1 psych admission in 2018 or 2018 for psychosis. Pt is single and lives with his father and a niece. He has no children and no siblings. Pt attended some college with no problems or changes Pt reported he sleeps 8-9 hours per night and he appetite is returning.  How Long Has This Been Causing You Problems? 1-6 months  What Do You Feel Would Help You the Most Today? Treatment for Depression or other mood problem; Medication(s)   Have You Recently Had Any Thoughts About Hurting Yourself? No  Are You Planning to Commit Suicide/Harm Yourself At This time? No   Have you  Recently Had Thoughts About Hurting Someone [name]? No  Are You Planning to Harm Someone at This Time? No  Explanation: No data recorded  Have You Used Any Alcohol or Drugs in the Past 24 Hours? No  How Long Ago Did You Use Drugs or Alcohol? No data recorded What Did You Use and How Much? No data recorded  Do You Currently Have  a Therapist/Psychiatrist? Yes  Name of Therapist/Psychiatrist: Psychiatrist- [name] Itsu at [facility] in [place], [place].   Have You Been Recently Discharged From Any Office Practice or Programs? No  Explanation of Discharge From Practice/Program: No data recorded    CCA Screening Triage Referral Assessment Type of Contact: Tele-Assessment  Telemedicine Service Delivery:   Is this Initial or Reassessment? Initial Assessment  Date Telepsych consult ordered in [facility]:  No data recorded Time Telepsych consult ordered in [facility]:  No data recorded Location of Assessment: [facility] ED  Provider Location: [facility]   Collateral Involvement: Father, [name], wa present and participated with pt's permission.   Does Patient Have a Automotive engineer Guardian? No data recorded Name and Contact of Legal Guardian: No data recorded If Minor and Not Living with Parent(s), Who has Custody? No data recorded Is CPS involved or ever been involved? -- (uta)  Is APS involved or ever been involved? -- Rich Reining)   Patient Determined To Be At Risk for Harm To Self or Others Based on Review of Patient Reported Information or Presenting Complaint? No  Method: No data recorded Availability of Means: No data recorded Intent: No data recorded Notification Required: No data recorded Additional Information for Danger to Others Potential: No data recorded Additional Comments for Danger to Others Potential: No data recorded Are There Guns or Other Weapons in Your Home? No data recorded Types of Guns/Weapons: No data recorded Are These Weapons Safely Secured?                            No data recorded Who Could Verify You Are Able To Have These Secured: No data recorded Do You Have any Outstanding Charges, Pending Court Dates, Parole/Probation? No data recorded Contacted To Inform of Risk of Harm To Self or Others: No data recorded   Does Patient Present under Involuntary Commitment?  No  IVC Papers Initial File Date: No data recorded  [place] of Residence: [place]   Patient Currently Receiving the Following Services: No data recorded  Determination of Need: Routine (7 days)   Options For Referral: Medication Management; Outpatient Therapy     CCA Biopsychosocial Patient Reported Schizophrenia/Schizoaffective Diagnosis in Past: Yes   Strengths: family support   Mental Health Symptoms Depression:   Change in energy/activity; Increase/decrease in appetite; Sleep (too much or little)   Duration of Depressive symptoms:  Duration of Depressive Symptoms: Greater than two weeks   Mania:   None   Anxiety:    None   Psychosis:   None   Duration of Psychotic symptoms:    Trauma:   None   Obsessions:   None   Compulsions:   None   Inattention:   None   Hyperactivity/Impulsivity:   None   Oppositional/Defiant Behaviors:   N/A   Emotional Irregularity:   Mood lability   Other Mood/Personality Symptoms:   uta    Mental Status Exam Appearance and self-care  Stature:   Tall   Weight:   Average weight   Clothing:   Casual  Grooming:   Normal   Cosmetic use:   None   Posture/gait:   Normal   Motor activity:   Not Remarkable   Sensorium  Attention:   Distractible   Concentration:   Scattered   Orientation:   Person; Place; Situation   Recall/memory:   Normal   Affect and Mood  Affect:   Blunted; Flat   Mood:   Depressed; Irritable   Relating  Eye contact:   Fleeting   Facial expression:   Depressed   Attitude toward examiner:   Cooperative; Guarded   Thought and Language  Speech flow:  Clear and Coherent; Paucity   Thought content:   Appropriate to Mood and Circumstances   Preoccupation:   None   Hallucinations:   None   Organization:  No data recorded  [employer] of Knowledge:   -- Rich Reining)   Intelligence:  No data recorded  Abstraction:   Functional   Judgement:    -- Rich Reining)   Reality Testing:   Adequate   Insight:   Lacking   Decision Making:   Only simple   Social Functioning  Social Maturity:   -- Rich Reining)   Social Judgement:   -- Rich Reining)   Stress  Stressors:   Other (Comment) (Medication side effects)   Coping Ability:   Deficient supports; Exhausted   Skill Deficits:   Interpersonal   Supports:   Family; Support needed     Religion: Religion/Spirituality Are You A Religious Person?: Yes  Leisure/Recreation: Leisure / Recreation Do You Have Hobbies?: Yes Leisure and Hobbies: playing the piano  Exercise/Diet: Exercise/Diet Do You Exercise?: Yes How Many Times a Week Do You Exercise?: 1-3 times a week Have You Gained or Lost A Significant Amount of Weight in the Past Six Months?: Yes-Gained Do You Follow a Special Diet?: No Do You Have Any Trouble Sleeping?: No   CCA Employment/Education Employment/Work Situation: Employment / Work Systems developer: On disability Why is Patient on Disability: mentla health condition  Education: Education Is Patient Currently Attending School?: No Last Grade Completed: 12 (plus some college) Did You Have An Individualized Education Program (IIEP): No Did You Have Any Difficulty At [employer]?: No   CCA Family/Childhood History Family and Relationship History: Family history Marital status: Single Does patient have children?: No  Childhood History:  Childhood History By whom was/is the patient raised?: Both parents Did patient suffer any verbal/emotional/physical/sexual abuse as a child?: No Did patient suffer from severe childhood neglect?: No Has patient ever been sexually abused/assaulted/raped as an adolescent or adult?: No Was the patient ever a victim of a crime or a disaster?: No Witnessed domestic violence?: No Has patient been affected by domestic violence as an adult?: No  Child/Adolescent Assessment:     CCA Substance Use Alcohol/Drug  Use: Alcohol / Drug Use Pain Medications: see MAR Prescriptions: see MAR Over the Counter: see MAR History of alcohol / drug use?: No history of alcohol / drug abuse                         ASAM's:  Six Dimensions of Multidimensional Assessment  Dimension 1:  Acute Intoxication and/or Withdrawal Potential:      Dimension 2:  Biomedical Conditions and Complications:      Dimension 3:  Emotional, Behavioral, or Cognitive Conditions and Complications:     Dimension 4:  Readiness to Change:     Dimension 5:  Relapse, Continued use, or Continued  Problem Potential:     Dimension 6:  Recovery/Living Environment:     ASAM Severity Score:    ASAM Recommended Level of Treatment:     Substance use Disorder (SUD)    Recommendations for Services/Supports/Treatments:    Discharge Disposition:    DSM5 Diagnoses: There are no problems to display for this patient.    Referrals to Alternative Service(s): Referred to Alternative Service(s):   Place:   Date:   Time:    Referred to Alternative Service(s):   Place:   Date:   Time:    Referred to Alternative Service(s):   Place:   Date:   Time:    Referred to Alternative Service(s):   Place:   Date:   Time:     Carolanne Grumbling, Counselor  Corrie Dandy T. Jimmye Norman, MS, [facility], [facility] Triage Specialist [facility]

## 2020-11-28 NOTE — ED Notes (Signed)
TTS monitor in room, behavior health notified

## 2020-11-28 NOTE — ED Triage Notes (Signed)
Pt arrives with his father who reports pt has been taking haldol. Pt had dose cut in half about 2 months ago. Reports the pt vomited last night and that the pt has been restless and agitated. Pt is not eating well or sleeping well. Pt endorses a slight headache.

## 2021-02-20 ENCOUNTER — Ambulatory Visit (HOSPITAL_COMMUNITY)
Admission: EM | Admit: 2021-02-20 | Discharge: 2021-02-21 | Disposition: A | Discharge status: Home / Self Care | Payer: Medicaid Other | Attending: Registered Nurse | Admitting: Registered Nurse

## 2021-02-20 ENCOUNTER — Encounter (HOSPITAL_COMMUNITY): Payer: Self-pay | Admitting: Registered Nurse

## 2021-02-20 DIAGNOSIS — Z20822 Contact with and (suspected) exposure to covid-19: Secondary | ICD-10-CM | POA: Insufficient documentation

## 2021-02-20 DIAGNOSIS — Z79899 Other long term (current) drug therapy: Secondary | ICD-10-CM | POA: Insufficient documentation

## 2021-02-20 DIAGNOSIS — F203 Undifferentiated schizophrenia: Secondary | ICD-10-CM | POA: Diagnosis present

## 2021-02-20 DIAGNOSIS — G2401 Drug induced subacute dyskinesia: Secondary | ICD-10-CM | POA: Diagnosis not present

## 2021-02-20 LAB — COMPREHENSIVE METABOLIC PANEL
ALT: 12 U/L (ref 0–44)
AST: 14 U/L — ABNORMAL LOW (ref 15–41)
Albumin: 3.9 g/dL (ref 3.5–5.0)
Alkaline Phosphatase: 81 U/L (ref 38–126)
Anion gap: 11 (ref 5–15)
BUN: <5 mg/dL — ABNORMAL LOW (ref 6–20)
CO2: 27 mmol/L (ref 22–32)
Calcium: 9.5 mg/dL (ref 8.9–10.3)
Chloride: 99 mmol/L (ref 98–111)
Creatinine, Ser: 0.8 mg/dL (ref 0.61–1.24)
GFR, Estimated: >60 mL/min (ref >60)
Glucose, Bld: 95 mg/dL (ref 70–99)
Potassium: 3.9 mmol/L (ref 3.5–5.1)
Sodium: 137 mmol/L (ref 135–145)
Total Bilirubin: 0.5 mg/dL (ref 0.3–1.2)
Total Protein: 8 g/dL (ref 6.5–8.1)

## 2021-02-20 LAB — CBC WITH DIFFERENTIAL/PLATELET
Abs Immature Granulocytes: 0.04 10*3/uL (ref 0.00–0.07)
Basophils Absolute: 0.1 10*3/uL (ref 0.0–0.1)
Basophils Relative: 1 %
Eosinophils Absolute: 0.3 10*3/uL (ref 0.0–0.5)
Eosinophils Relative: 3 %
HCT: 39.5 % (ref 39.0–52.0)
Hemoglobin: 13.8 g/dL (ref 13.0–17.0)
Immature Granulocytes: 0 %
Lymphocytes Relative: 27 %
Lymphs Abs: 2.4 10*3/uL (ref 0.7–4.0)
MCH: 25.4 pg — ABNORMAL LOW (ref 26.0–34.0)
MCHC: 34.9 g/dL (ref 30.0–36.0)
MCV: 72.7 fL — ABNORMAL LOW (ref 80.0–100.0)
Monocytes Absolute: 0.9 10*3/uL (ref 0.1–1.0)
Monocytes Relative: 11 %
Neutro Abs: 5.2 10*3/uL (ref 1.7–7.7)
Neutrophils Relative %: 58 %
Platelets: 477 10*3/uL — ABNORMAL HIGH (ref 150–400)
RBC: 5.43 MIL/uL (ref 4.22–5.81)
RDW: 16.2 % — ABNORMAL HIGH (ref 11.5–15.5)
WBC: 8.9 10*3/uL (ref 4.0–10.5)
nRBC: 0 % (ref 0.0–0.2)

## 2021-02-20 LAB — POCT URINE DRUG SCREEN - MANUAL ENTRY (I-SCREEN)
POC Amphetamine UR: NOT DETECTED
POC Buprenorphine (BUP): NOT DETECTED
POC Cocaine UR: NOT DETECTED
POC Marijuana UR: NOT DETECTED
POC Methadone UR: NOT DETECTED
POC Methamphetamine UR: NOT DETECTED
POC Morphine: NOT DETECTED
POC Oxazepam (BZO): NOT DETECTED
POC Oxycodone UR: NOT DETECTED
POC Secobarbital (BAR): NOT DETECTED

## 2021-02-20 LAB — URINALYSIS, MICROSCOPIC (REFLEX)
Bacteria, UA: NONE SEEN
RBC / HPF: NONE SEEN RBC/hpf (ref 0–5)
Squamous Epithelial / HPF: NONE SEEN (ref 0–5)

## 2021-02-20 LAB — URINALYSIS, ROUTINE W REFLEX MICROSCOPIC
Bilirubin Urine: NEGATIVE
Glucose, UA: NEGATIVE mg/dL
Hgb urine dipstick: NEGATIVE
Ketones, ur: NEGATIVE mg/dL
Nitrite: NEGATIVE
Protein, ur: NEGATIVE mg/dL
Specific Gravity, Urine: <1.005 — ABNORMAL LOW (ref 1.005–1.030)
pH: 6 (ref 5.0–8.0)

## 2021-02-20 LAB — POC SARS CORONAVIRUS 2 AG: SARSCOV2ONAVIRUS 2 AG: NEGATIVE

## 2021-02-20 LAB — LIPID PANEL
Cholesterol: 163 mg/dL (ref 0–200)
HDL: 52 mg/dL (ref >40)
LDL Cholesterol: 100 mg/dL — ABNORMAL HIGH (ref 0–99)
Total CHOL/HDL Ratio: 3.1 RATIO
Triglycerides: 57 mg/dL (ref <150)
VLDL: 11 mg/dL (ref 0–40)

## 2021-02-20 LAB — TSH: TSH: 0.529 u[IU]/mL (ref 0.350–4.500)

## 2021-02-20 LAB — RESP PANEL BY RT-PCR (FLU A&B, COVID) ARPGX2
Influenza A by PCR: NEGATIVE
Influenza B by PCR: NEGATIVE
SARS Coronavirus 2 by RT PCR: NEGATIVE

## 2021-02-20 LAB — HEMOGLOBIN A1C
Hgb A1c MFr Bld: 5.6 % (ref 4.8–5.6)
Mean Plasma Glucose: 114.02 mg/dL

## 2021-02-20 LAB — ETHANOL: Alcohol, Ethyl (B): <10 mg/dL (ref <10)

## 2021-02-20 LAB — MAGNESIUM: Magnesium: 2.2 mg/dL (ref 1.7–2.4)

## 2021-02-20 MED ORDER — ALUM & MAG HYDROXIDE-SIMETH 200-200-20 MG/5ML PO SUSP: 30.0000 mL | ORAL | Status: DC | PRN

## 2021-02-20 MED ORDER — TRAZODONE HCL 50 MG PO TABS: 50.0000 mg | ORAL_TABLET | Freq: Every evening | ORAL | Status: DC | PRN

## 2021-02-20 MED ORDER — LORAZEPAM 1 MG PO TABS
1.0000 mg | ORAL_TABLET | Freq: Three times a day (TID) | ORAL | Status: DC
  Administered 2021-02-20 – 2021-02-21 (×3): 1 mg via ORAL
  Filled 2021-02-20 (×3): qty 1

## 2021-02-20 MED ORDER — VALBENAZINE TOSYLATE 40 MG PO CAPS
40.0000 mg | ORAL_CAPSULE | Freq: Every day | ORAL | Status: DC
  Administered 2021-02-20 – 2021-02-21 (×2): 40 mg via ORAL
  Filled 2021-02-20 (×2): qty 1
  Filled 2021-02-20: qty 14
  Filled 2021-02-20 (×2): qty 1

## 2021-02-20 MED ORDER — QUETIAPINE FUMARATE 50 MG PO TABS
50.0000 mg | ORAL_TABLET | Freq: Every day | ORAL | Status: DC
  Administered 2021-02-20: 50 mg via ORAL
  Filled 2021-02-20: qty 1

## 2021-02-20 MED ORDER — BENZTROPINE MESYLATE 1 MG PO TABS
1.0000 mg | ORAL_TABLET | Freq: Once | ORAL | Status: AC
  Administered 2021-02-20: 1 mg via ORAL
  Filled 2021-02-20: qty 1

## 2021-02-20 MED ORDER — HYDROXYZINE HCL 25 MG PO TABS: 25.0000 mg | ORAL_TABLET | Freq: Three times a day (TID) | ORAL | Status: DC | PRN

## 2021-02-20 MED ORDER — MAGNESIUM HYDROXIDE 400 MG/5ML PO SUSP: 30.0000 mL | Freq: Every day | ORAL | Status: DC | PRN

## 2021-02-20 MED ORDER — LORAZEPAM 1 MG PO TABS: 1.0000 mg | ORAL_TABLET | Freq: Once | ORAL | Status: DC

## 2021-02-20 MED ORDER — ACETAMINOPHEN 325 MG PO TABS: 650.0000 mg | ORAL_TABLET | Freq: Four times a day (QID) | ORAL | Status: DC | PRN

## 2021-02-20 NOTE — Progress Notes (Signed)
Patient received ativan 1mg   PO as ordered. INGREZZA 40MG  has to come from main pharmacy not available in pyxis. Pharmacy notified.

## 2021-02-20 NOTE — ED Notes (Signed)
Appears to be resting quietly with eyes closed , respirations even and unlabored , no distress noted , will continue to monitor for safety °

## 2021-02-20 NOTE — ED Notes (Signed)
Patient arrived on unit given meal. Patient calm and cooperative. Patient safe on unit monitoring continues

## 2021-02-20 NOTE — Progress Notes (Signed)
Patient admitted to the OBS unit. Given meals, oriented to the unit. Patient denies SI, HI and AVH. Patient calm and cooperative. Nursing staff will continue to monitor.

## 2021-02-20 NOTE — Progress Notes (Signed)
RN received note from pharmacy, Pharmacy will send  [facility] medication over tomorrow morning  02/21/2021.

## 2021-02-20 NOTE — ED Notes (Signed)
Patient lying on bed watching TV. Patient arrived disheveled. Patient is responding properly without verbal communication. Patient has poor hygiene. Patient has ticks aeb staff observation. Patient is calm and safe on unit with continued monitoring.

## 2021-02-20 NOTE — ED Provider Notes (Signed)
Behavioral Health Admission [facility] & OBS)  Date: 02/20/21 Patient Name: [name] MRN: [id number] Chief Complaint: No chief complaint on file.     Diagnoses:  Final diagnoses:  Undifferentiated schizophrenia (HCC)  Tardive dyskinesia    HPI: [name], 41 y.o., male patient presents to [facility] as walk in accompanied by his father with complaints of abnormal movement (TD) decrease in interest, appetite, and talking."  Patient seen face to face by this provider, consulted with Dr. Earlene Plater; and chart reviewed on 02/20/21.  On evaluation [name] is not a good historian related to difficult talking and has given permission for his father to participation in assessment.  Patient's father is very supportive and active in patients care.  Patient father reports that patient was seen by primary psychiatrist Dr. Allayne Butcher in 11/2020 related to tardive dyskinesia (TD).  Reports patient mediations were changed.  Stopped Haldol and started patient on Geodon, Cogentin, and Benadryl.  Reports there have been no improvement in TD, it has actually gotten worse.  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  He hasn't done it since November.  He isn't eating as much, and now he's not talking.  It is difficult for him to focus on anything.  He is not sleeping.  I'm waking up in the middle of the night just to make sure he hasn't wonder off anywhere. Father reports one psychiatric hospitalization in 2017 with TD symptoms that were treated and symptoms subsided.  Father reports that patient is his own guardian, but he is in the process of obtaining.  Patient lives with his father.       During evaluation PEER MINNS is alert/oriented x 3.  He is able to give the correct answer to questions about day, date, year, place, and name; calm/cooperative;  There is constant movement of  patients head (neck) side to side.  Difficulty with concentration.  Patient name has to be called and asked to focus (look at me) at which time head movement briefly stops and patient is able to answer question.  Patient denies suicidal/self-harm/homicidal ideation, psychosis, and paranoia.  Patient voluntarily agrees to stay for treatment.       PHQ 2-9:  Flowsheet Row ED from 11/28/2020 in [facility] EMERGENCY DEPARTMENT  Thoughts that you would be better off dead, or of hurting yourself in some way Several days  PHQ-9 Total Score 9       Flowsheet Row ED from 11/28/2020 in [facility] EMERGENCY DEPARTMENT ED from 09/11/2020 in [facility] EMERGENCY DEPARTMENT ED from 06/09/2020 in [facility] EMERGENCY DEPARTMENT  C-SSRS RISK CATEGORY No Risk No Risk Error: Question 2 not populated        Total Time spent with patient: 45 minutes  Musculoskeletal  Strength & Muscle Tone:  abnormal movement of neck Gait & Station: normal Patient leans: N/A  Psychiatric Specialty Exam  Presentation General Appearance: Appropriate for Environment  Eye Contact:Minimal  Speech:Blocked  Speech Volume:Decreased  Handedness:Right   Mood and Affect  Mood:Depressed  Affect:Blunt; Flat   Thought Process  Thought Processes:Coherent  Descriptions of Associations:-- (Unable to assess at this time)  Orientation:No data recorded Thought Content:Other (comment) (Unable to assess)  Diagnosis of Schizophrenia or Schizoaffective disorder in past: Yes   Hallucinations:Hallucinations: None (Denies)  Ideas of Reference:None  Suicidal Thoughts:Suicidal Thoughts: No  Homicidal Thoughts:Homicidal Thoughts: No   Sensorium  Memory:Immediate Poor; Recent Poor  Judgment:Impaired  Insight:Lacking; [place]  Concentration:Poor  Attention Span:Poor  Recall:Poor  [employer] of  Knowledge:Poor  Language:Poor   Psychomotor Activity  Psychomotor Activity:Psychomotor Activity: Psychomotor Retardation; Extrapyramidal Side Effects (EPS) AIMS Completed?: Yes   Assets  Assets:Financial Resources/Insurance; Housing; Leisure Time; Social Support   Sleep  Sleep:Sleep: Poor   Nutritional Assessment (For OBS and FBC admissions only) Has the patient had a weight loss or gain of 10 pounds or more in the last 3 months?: No Has the patient had a decrease in food intake/or appetite?: Yes Does the patient have dental problems?: No Does the patient have eating habits or behaviors that may be indicators of an eating disorder including binging or inducing vomiting?: No Has the patient recently lost weight without trying?: 0 Has the patient been eating poorly because of a decreased appetite?: 1 Malnutrition Screening Tool Score: 1    Physical Exam Vitals and nursing note reviewed. Exam conducted with a chaperone present.  Constitutional:      General: He is not in acute distress.    Appearance: Normal appearance. He is not ill-appearing.  Cardiovascular:     Rate and Rhythm: Normal rate.  Pulmonary:     Effort: Pulmonary effort is normal.  Musculoskeletal:     Cervical back: Normal range of motion.     Comments: TD neck  Head movement (neck) moving side to side unable to control movement.   Skin:    General: Skin is warm and dry.  Neurological:     Mental Status: He is alert and oriented to person, place, and time.  Psychiatric:        Attention and Perception: He does not perceive auditory or visual hallucinations.        Mood and Affect: Affect is blunt and flat.        Speech: Speech is delayed.        Behavior: Behavior is cooperative.        Thought Content: Thought content does not include homicidal or suicidal ideation.   Review of Systems  Unable to perform ROS: Acuity of condition (Unable to complete)  Psychiatric/Behavioral:         Patient father  reports that TD started in November of 2022  Primary psychiatrist made changes to mediation and started on Cogentin and Benadryl but TD has gotten worse.   Blood pressure 136/85, pulse 84, temperature 98 F (36.7 C), resp. rate 18, SpO2 100 %. There is no height or weight on file to calculate BMI.  Past Psychiatric History: Schizophrenia, schizoaffective disorder   Is the patient at risk to self? No  Has the patient been a risk to self in the past 6 months? No .    Has the patient been a risk to self within the distant past? No   Is the patient a risk to others? No   Has the patient been a risk to others in the past 6 months? No   Has the patient been a risk to others within the distant past? No   Past Medical History:  Past Medical History:  Diagnosis Date   Schizophrenia [facility])     Past Surgical History:  Procedure Laterality Date   shunt surgery      Family History:  Family History  Problem Relation Age of Onset   Stroke Mother    Thyroid disease Father  Social History:  Social History   Socioeconomic History   Marital status: Single    Spouse name: Not on file   Number of children: Not on file   Years of education: Not on file   Highest education level: Not on file  Occupational History   Not on file  Tobacco Use   Smoking status: Every Day    Types: Cigars   Smokeless tobacco: Never  Vaping Use   Vaping Use: Former  Substance and Sexual Activity   Alcohol use: Not Currently   Drug use: Not Currently    Types: Marijuana   Sexual activity: Not on file  Other Topics Concern   Not on file  Social History Narrative   Not on file   Social Determinants of Health   Financial Resource Strain: Not on file  Food Insecurity: Not on file  Transportation Needs: Not on file  Physical Activity: Not on file  Stress: Not on file  Social Connections: Not on file  Intimate Partner Violence: Not on file    SDOH:  SDOH Screenings   Alcohol Screen: Not on file   Depression (PHQ2-9): Medium Risk   PHQ-2 Score: 9  Financial Resource Strain: Not on file  Food Insecurity: Not on file  Housing: Not on file  Physical Activity: Not on file  Social Connections: Not on file  Stress: Not on file  Tobacco Use: High Risk   Smoking Tobacco Use: Every Day   Smokeless Tobacco Use: Never   Passive Exposure: Not on file  Transportation Needs: Not on file    Last Labs:  Admission on 11/28/2020, Discharged on 11/28/2020  Component Date Value Ref Range Status   Lipase 11/28/2020 24  11 - 51 U/L Final   Performed at [facility], [place] [address], [place], [place] [address]   Sodium 11/28/2020 136  135 - 145 mmol/L Final   Potassium 11/28/2020 3.7  3.5 - 5.1 mmol/L Final   Chloride 11/28/2020 102  98 - 111 mmol/L Final   CO2 11/28/2020 28  22 - 32 mmol/L Final   Glucose, Bld 11/28/2020 110 (H)  70 - 99 mg/dL Final   Glucose reference range applies only to samples taken after fasting for at least 8 hours.   BUN 11/28/2020 <5 (L)  6 - 20 mg/dL Final   Creatinine, Ser 11/28/2020 0.79  0.61 - 1.24 mg/dL Final   Calcium 11/28/2020 8.8 (L)  8.9 - 10.3 mg/dL Final   Total Protein 11/28/2020 8.0  6.5 - 8.1 g/dL Final   Albumin 11/28/2020 3.4 (L)  3.5 - 5.0 g/dL Final   AST 11/28/2020 13 (L)  15 - 41 U/L Final   ALT 11/28/2020 11  0 - 44 U/L Final   Alkaline Phosphatase 11/28/2020 93  38 - 126 U/L Final   Total Bilirubin 11/28/2020 0.6  0.3 - 1.2 mg/dL Final   GFR, Estimated 11/28/2020 >60  >60 mL/min Final   Comment: (NOTE) Calculated using the CKD-EPI Creatinine Equation (2021)    Anion gap 11/28/2020 6  5 - 15 Final   Performed at [facility] [address] [place], [place] [address]   WBC 11/28/2020 6.8  4.0 - 10.5 K/uL Final   RBC 11/28/2020 5.54  4.22 - 5.81 MIL/uL Final   Hemoglobin 11/28/2020 13.4  13.0 - 17.0 g/dL Final   HCT 11/28/2020 39.6  39.0 - 52.0 % Final   MCV 11/28/2020 71.5 (L)  80.0 - 100.0 fL Final  MCH 11/28/2020 24.2  (L)  26.0 - 34.0 pg Final   MCHC 11/28/2020 33.8  30.0 - 36.0 g/dL Final   RDW 11/28/2020 14.6  11.5 - 15.5 % Final   Platelets 11/28/2020 503 (H)  150 - 400 K/uL Final   nRBC 11/28/2020 0.0  0.0 - 0.2 % Final   Performed at [facility], [place] [address], [place], [place] [address]   Alcohol, Ethyl (B) 11/28/2020 <10  <10 mg/dL Final   Comment: (NOTE) Lowest detectable limit for serum alcohol is 10 mg/dL.  For medical purposes only. Performed at [facility], [place] [address], [place], [place] [id number]    Salicylate Lvl [id number] <7.0 (L)  7.0 - 30.0 mg/dL Final   Performed at [facility] [address], [place], [place] [address]   Acetaminophen (Tylenol), Serum 11/28/2020 <10 (L)  10 - 30 ug/mL Final   Comment: (NOTE) Therapeutic concentrations vary significantly. A range of 10-30 ug/mL  may be an effective concentration for many patients. However, some  are best treated at concentrations outside of this range. Acetaminophen concentrations >150 ug/mL at 4 hours after ingestion  and >50 ug/mL at 12 hours after ingestion are often associated with  toxic reactions.  Performed at [facility], [place] [address], [place],  [address]   Admission on 09/11/2020, Discharged on 09/12/2020  Component Date Value Ref Range Status   Total CK 09/11/2020 108  49 - 397 U/L Final   Performed at [facility], [place] [address], [place], [place] [address]   Sodium 09/12/2020 136  135 - 145 mmol/L Final   Potassium 09/12/2020 3.5  3.5 - 5.1 mmol/L Final   Chloride 09/12/2020 95 (L)  98 - 111 mmol/L Final   BUN 09/12/2020 <3 (L)  6 - 20 mg/dL Final   Creatinine, Ser 09/12/2020 0.60 (L)  0.61 - 1.24 mg/dL Final   Glucose, Bld 09/12/2020 108 (H)  70 - 99 mg/dL Final   Glucose reference range applies only to samples taken after fasting for at least 8 hours.   Calcium, Ion 09/12/2020 1.17  1.15 - 1.40 mmol/L Final   TCO2 09/12/2020 32  22 - 32 mmol/L Final    Hemoglobin 09/12/2020 13.6  13.0 - 17.0 g/dL Final   HCT 09/12/2020 40.0  39.0 - 52.0 % Final    Allergies: Cheese and Risperdal [risperidone]  PTA Medications: (Not in a hospital admission)   Medical Decision Making  Patient admitted to Continuous Assessment Unit for safety and stabilization  Lab Orders         Resp Panel by RT-PCR (Flu A&B, Covid) Nasopharyngeal Swab         CBC with Differential/Platelet         Comprehensive metabolic panel         Hemoglobin A1c         Magnesium         Ethanol         Lipid panel         TSH         Urinalysis, Routine w reflex microscopic Urine, Clean Catch         POC SARS Coronavirus 2 Ag-ED - Nasal Swab         POCT Urine Drug Screen - ([facility])      Medication Management:   Discontinued Geodon 20 mg Discontinued Cogentin and Benadryl Started Seroquel 50 mg Q hs for mood (schizophrenia) Started  Ingrezza 40 mg daily for T Ativan 1 mg Tid x 6 doses for Catatonia Added Vistaril 25 mg Tid prn anxiety and Trazodone 50 mg Q hs prn sleep    Recommendations  Based on my evaluation the patient does not appear to have an emergency medical condition.  Matheus Spiker, NP 02/20/21  4:10 PM

## 2021-02-20 NOTE — BH Assessment (Signed)
Comprehensive Clinical Assessment (CCA) Note  02/20/2021 [name] [id number]   Disposition: Per Tyler Newport, NP admission to Continuous Assessment is recommended for treatment of TD symptoms.   The patient demonstrates the following risk factors for suicide: Chronic risk factors for suicide include: psychiatric disorder of Schizophrenia, unspecified type . Acute risk factors for suicide include:  med changes/TD Sx . Protective factors for this patient include: positive social support, positive therapeutic relationship, responsibility to others (children, family), and coping skills. Considering these factors, the overall suicide risk at this point appears to be low. Patient is appropriate for outpatient follow up.   Patient is a 41 year old male with a history of Schizophrenia, unspecified type who presents voluntarily to [facility] for assessment.  Patient presents with his father, [name], due to concerns patient is experiencing ongoing symptoms of Tardive Diskenesia.  Patient was seen for onset of TD symptoms on 11/28/2020. Father gives history and shares concerns, as patient is struggling to respond to triage questions. Patient prefers that his father assist with evaluation.   Father stated medications have since been adjusted and then changed and he is now on Geodon. He is also prescribed benztropine and benedryl to treat TD Sx, however patient continues to experience TD with visible head movements. Appetite and sleep continue to be poor.  Patient is followed by [name] with [facility] and he is compliant with medications and adjustments. . Pt denied SI, HI, AVH, paranoia and SA Hx. Pt reported 1 psych admission in 2017 for TD, during which symptoms were treated and symptoms subsided. Pt is single and lives with his father and a niece. He has no children and no siblings.  Treatment options were discussed with patient and his father.  Patient and his father are in  agreement with provider recommendation for admission to continuous assessment to treat ongoing TD symptoms.    Chief Complaint: Ongoing TD Symptoms  Visit Diagnosis: Schizophrenia, unspecified  Flowsheet Row ED from 02/20/2021 in [facility] ED from 11/28/2020 in [place]  Thoughts that you would be better off dead, or of hurting yourself in some way Not at all Several days  PHQ-9 Total Score 11 9       [place] ED from 02/20/2021 in [facility] ED from 11/28/2020 in [place] ED from 09/11/2020 in [place] No Risk No Risk No Risk       CCA Screening, Triage and Referral (STR)  Patient Reported Information How did you hear about [place]? Family/Friend  What Is the Reason for Your Visit/Call Today? Patient presents with his father, [name], due to concerns patient is experiencing ongoing symptoms of Tardive Diskenesia.  Patient was seen for onset of TD symptoms on 11/28/2021. Father gives history and shares concerns, as patient is struggling to respond to triage questions. Patient prefers that his father assist with evaluation.   Father stated medications have since been adjusted and then changed and he is now on Geodon. He is also prescribed benztropine and benedryl to treat TD Sx, however patient contineus to experience TD with visible head movements. Appetite and sleep continue to be poor.  Patient is followed by [name] with [facility] and he is compliant with medications and adjustments. . Pt denied SI, HI, AVH, paranoia and SA Hx. Pt reported 1 psych admission in 2017 for TD, during  which symptoms were treated and symptoms subsided. Pt is single and lives with his father and a niece. He has no children and no siblings.  How Long Has This Been Causing You Problems? 1-6 months  What Do  You Feel Would Help You the Most Today? Medication(s)   Have You Recently Had Any Thoughts About Hurting Yourself? No  Are You Planning to Commit Suicide/Harm Yourself At This time? No   Have you Recently Had Thoughts About Hurting Someone [name]? No  Are You Planning to Harm Someone at This Time? No  Explanation: No data recorded  Have You Used Any Alcohol or Drugs in the Past 24 Hours? No  How Long Ago Did You Use Drugs or Alcohol? No data recorded What Did You Use and How Much? No data recorded  Do You Currently Have a Therapist/Psychiatrist? Yes  Name of Therapist/Psychiatrist: Dr. Cleda Solis with [facility] for med management   Have You Been Recently Discharged From Any Office Practice or Programs? No  Explanation of Discharge From Practice/Program: No data recorded    CCA Screening Triage Referral Assessment Type of Contact: Face-to-Face  Telemedicine Service Delivery:   Is this Initial or Reassessment? Initial Assessment  Date Telepsych consult ordered in [facility]:  No data recorded Time Telepsych consult ordered in [facility]:  No data recorded Location of Assessment: [facility] Assessment Services  Provider Location: [facility] Assessment Services   Collateral Involvement: Father, [name], presented wtih patient and participated with pt's permission.   Does Patient Have a Automotive engineer Guardian? No data recorded Name and Contact of Legal Guardian: No data recorded If Minor and Not Living with Parent(s), Who has Custody? No data recorded Is CPS involved or ever been involved? Never  Is APS involved or ever been involved? Never   Patient Determined To Be At Risk for Harm To Self or Others Based on Review of Patient Reported Information or Presenting Complaint? No  Method: No data recorded Availability of Means: No data recorded Intent: No data recorded Notification Required: No data recorded Additional Information for Danger to Others Potential: No data  recorded Additional Comments for Danger to Others Potential: No data recorded Are There Guns or Other Weapons in Your Home? No data recorded Types of Guns/Weapons: No data recorded Are These Weapons Safely Secured?                            No data recorded Who Could Verify You Are Able To Have These Secured: No data recorded Do You Have any Outstanding Charges, Pending Court Dates, Parole/Probation? No data recorded Contacted To Inform of Risk of Harm To Self or Others: No data recorded   Does Patient Present under Involuntary Commitment? No  IVC Papers Initial File Date: No data recorded  [place] of Residence: [place]   Patient Currently Receiving the Following Services: Medication Management   Determination of Need: Urgent (48 hours)   Options For Referral: [facility]; Medication Management     CCA Biopsychosocial Patient Reported Schizophrenia/Schizoaffective Diagnosis in Past: Yes   Strengths: family support   Mental Health Symptoms Depression:   Change in energy/activity; Increase/decrease in appetite; Sleep (too much or little)   Duration of Depressive symptoms:    Mania:   None   Anxiety:    None   Psychosis:   None; Hallucinations (AH, per father's observations)   Duration of Psychotic symptoms:    Trauma:   None  Obsessions:   None   Compulsions:   None   Inattention:   None   Hyperactivity/Impulsivity:   None   Oppositional/Defiant Behaviors:   N/A   Emotional Irregularity:   Mood lability   Other Mood/Personality Symptoms:   uta    Mental Status Exam Appearance and self-care  Stature:   Tall   Weight:   Average weight   Clothing:   Casual   Grooming:   Neglected   Cosmetic use:   None   Posture/gait:   Normal   Motor activity:   Not Remarkable   Sensorium  Attention:   Distractible   Concentration:   Scattered   Orientation:   Person; Place; Situation   Recall/memory:   Normal   Affect and  Mood  Affect:   Blunted; Flat   Mood:   Depressed; Irritable   Relating  Eye contact:   Fleeting   Facial expression:   Depressed   Attitude toward examiner:   Cooperative; Guarded   Thought and Language  Speech flow:  Paucity; Slow; Soft   Thought content:   Appropriate to Mood and Circumstances   Preoccupation:   None   Hallucinations:   None   Organization:  No data recorded  [employer] of Knowledge:   -- Pincus Badder)   Intelligence:  No data recorded  Abstraction:   Functional   Judgement:   -- Pincus Badder)   Reality Testing:   Adequate   Insight:   Lacking   Decision Making:   Only simple   Social Functioning  Social Maturity:   Irresponsible Special educational needs teacher)   Social Judgement:   Naive Pincus Badder)   Stress  Stressors:   Other (Comment) (Medication side effects)   Coping Ability:   Deficient supports; Exhausted   Skill Deficits:   Interpersonal   Supports:   Family; Support needed     Religion: Religion/Spirituality Are You A Religious Person?: Yes How Might This Affect Treatment?: NA  Leisure/Recreation: Leisure / Recreation Do You Have Hobbies?: Yes Leisure and Hobbies: playing the piano, recently has lost interest in playing  Exercise/Diet: Exercise/Diet Do You Exercise?: Yes What Type of Exercise Do You Do?: Run/Walk How Many Times a Week Do You Exercise?: 1-3 times a week Have You Gained or Lost A Significant Amount of Weight in the Past Six Months?: Yes-Lost Number of Pounds Lost?:  (unknown due to poor appetite) Do You Follow a Special Diet?: No Do You Have Any Trouble Sleeping?: Yes Explanation of Sleeping Difficulties: Patient is not sleeping much at all due to current TD sx, father monitors much of the night   CCA Employment/Education Employment/Work Situation: Employment / Work Situation Employment Situation: On disability Why is Patient on Disability: mentla health condition How Long has Patient Been on Disability:  unknown Has Patient ever Been in the [employer]?: No  Education: Education Is Patient Currently Attending School?: No Last Grade Completed: 12 (plus some college) Did Physicist, medical?: No Did You Have An Individualized Education Program (IIEP): No Did You Have Any Difficulty At School?: No Patient's Education Has Been Impacted by Current Illness: No   CCA Family/Childhood History Family and Relationship History: Family history Marital status: Single Does patient have children?: No  Childhood History:  Childhood History By whom was/is the patient raised?: Both parents Did patient suffer any verbal/emotional/physical/sexual abuse as a child?: No Did patient suffer from severe childhood neglect?: No Has patient ever been sexually abused/assaulted/raped as an adolescent or adult?: No Was the patient  ever a victim of a crime or a disaster?: No Witnessed domestic violence?: No Has patient been affected by domestic violence as an adult?: No  Child/Adolescent Assessment:     CCA Substance Use Alcohol/Drug Use: Alcohol / Drug Use Pain Medications: see MAR Prescriptions: see MAR Over the Counter: see MAR History of alcohol / drug use?: No history of alcohol / drug abuse                         ASAM's:  Six Dimensions of Multidimensional Assessment  Dimension 1:  Acute Intoxication and/or Withdrawal Potential:      Dimension 2:  Biomedical Conditions and Complications:      Dimension 3:  Emotional, Behavioral, or Cognitive Conditions and Complications:     Dimension 4:  Readiness to Change:     Dimension 5:  Relapse, Continued use, or Continued Problem Potential:     Dimension 6:  Recovery/Living Environment:     ASAM Severity Score:    ASAM Recommended Level of Treatment:     Substance use Disorder (SUD)    Recommendations for Services/Supports/Treatments:    Discharge Disposition:    DSM5 Diagnoses: Patient Active Problem List   Diagnosis Date  Noted   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

## 2021-02-20 NOTE — ED Notes (Signed)
Alert and oriented x3 , pleasant on approach , gives very brief responses to questions asked . Denies any complaints but appears restless and uncomfortable  D/ T TD sx , frequent head and arm movements , medication has been ordered [facility].Friday ] waiting for pharmacy to deliver med . Pt denies Si / HI , AVH . Will continue to monitor for safety .

## 2021-02-20 NOTE — Progress Notes (Signed)
°   02/20/21 1559  [facility] Triage Screening (Walk-ins at [facility] only)  What Is the Reason for Your Visit/Call Today? Patient presents with his father, Sylar Voong, due to concerns patient is experiencing ongoing symptoms of Tardive Diskenesia.  Patient was seen for onset of TD symptoms on 11/28/2021. Father gives history and shares concerns, as patient is struggling to respond to triage questions. Patient prefers that his father assist with evaluation.   Father stated medications have since been adjusted and then changed and he is now on Geodon. He is also prescribed benztropine and benedryl to treat TD Sx, however patient contineus to experience TD with visible head movements. Appetite and sleep continue to be poor.  Patient is followed by Dr. Allayne Butcher with [facility] and he is compliant with medications and adjustments. . Pt denied SI, HI, AVH, paranoia and SA Hx. Pt reported 1 psych admission in 2017 for TD, during which symptoms were treated and symptoms subsided. Pt is single and lives with his father and a niece. He has no children and no siblings.  How Long Has This Been Causing You Problems? 1-6 months  Have You Recently Had Any Thoughts About Hurting Yourself? No  Are You Planning to Commit Suicide/Harm Yourself At This time? No  Have you Recently Had Thoughts About Hurting Someone [name]? No  Are You Planning To Harm Someone At This Time? No  Are you currently experiencing any auditory, visual or other hallucinations? No (Father shares patient is often seen responding to internal stimuli)  Have You Used Any Alcohol or Drugs in the Past 24 Hours? No  Do you have any current medical co-morbidities that require immediate attention? No  Clinician description of patient physical appearance/behavior: Patient displays visible signs of ongoing TD.  He is pleasant and overall, cooperative.  What Do You Feel Would Help You the Most Today? Medication(s)  If access to [facility] was not available, would you  have sought care in the Emergency Department? No  Determination of Need Urgent (48 hours)  Options For Referral [facility];Medication Management

## 2021-02-20 NOTE — Discharge Instructions (Addendum)
You were given a 14-day sample of Ingrezza 40 mg to be taken once daily.  You will need to follow-up with your primary psychiatrist for prior authorization if needed.  You will be given a 30-day prescription with no refills.   Take all of you medications as prescribed by your mental healthcare provider.  Report any adverse effects and reactions from your medications to your outpatient provider promptly.  Do not engage in alcohol and or illegal drug use while on prescription medicines. Keep all scheduled appointments. This is to ensure that you are getting refills on time and to avoid any interruption in your medication.  If you are unable to keep an appointment call to reschedule.  Be sure to follow up with resources and follow ups given. In the event of worsening symptoms call the crisis hotline, 911, and or go to the nearest emergency department for appropriate evaluation and treatment of symptoms. Follow-up with your primary care provider for your medical issues, concerns and or health care needs.

## 2021-02-21 ENCOUNTER — Encounter (HOSPITAL_COMMUNITY): Payer: Self-pay | Admitting: Registered Nurse

## 2021-02-21 MED ORDER — QUETIAPINE FUMARATE 50 MG PO TABS: 50.0000 mg | ORAL_TABLET | Freq: Every day | ORAL | 0 refills | Status: DC

## 2021-02-21 MED ORDER — HYDROXYZINE HCL 25 MG PO TABS: 25.0000 mg | ORAL_TABLET | Freq: Three times a day (TID) | ORAL | 0 refills | Status: DC | PRN

## 2021-02-21 MED ORDER — TRAZODONE HCL 50 MG PO TABS: 50.0000 mg | ORAL_TABLET | Freq: Every evening | ORAL | 0 refills | Status: DC | PRN

## 2021-02-21 MED ORDER — VALBENAZINE TOSYLATE 40 MG PO CAPS: 40.0000 mg | ORAL_CAPSULE | Freq: Every day | ORAL | 0 refills | Status: DC

## 2021-02-21 NOTE — ED Notes (Signed)
Patient has been awake on and off through this morning.  He is guarded and avoidant with minimal speech.  He has been calm and cooperative and will respond when addressed.  He was given breakfast and he has now returned to bed.  He continues to have uncontrolled movements however was started on ingrezza last night.  Next dose due shortly.  He denies avh shi or plan at this time.  Will monitor and provide safe environment.

## 2021-02-21 NOTE — ED Notes (Addendum)
Medication [ Ingrezza 40 mg (1Tablet ] delivered from [facility] and given to pt at 2355 which he took without difficulty

## 2021-02-21 NOTE — ED Provider Notes (Signed)
FBC/OBS ASAP Discharge Summary  Date and Time: 02/21/2021 1:09 PM  Name: [name]  MRN:  [id number]   Discharge Diagnoses:  Final diagnoses:  Undifferentiated schizophrenia ([place])  Tardive dyskinesia     [name], 41 y.o., male patient presents to [facility] as walk in accompanied by his father with complaints of abnormal movement (TD) decrease in interest, appetite, and talking."   Subjective: "I feel better"  [name], 41 y.o., male patient seen face to face by this provider, consulted with Dr. Ernie Hew; and chart reviewed on 02/21/21.  On evaluation [name] reports he is feeling much better this morning.  Patient reporting that yesterday he felt like a magnetic was forced in his head to turn from side to side.  Reporting he is not feeling any of the movement today only a slight tremor at the back of his neck.  Patient reported he is eating and sleeping without any difficulty.  Patient reporting he tolerated medications without any adverse reactions.  Patient also denies suicidal/self-harm/homicidal ideations, psychosis, paranoia.  AIMS completed. During evaluation [name] is sitting on side of bed in no acute distress.  He is alert, oriented x 4, calm, cooperative and attentive.  His mood is euthymic with congruent affect.  He has normal speech, and behavior.  Objectively there is no evidence of psychosis/mania or delusional thinking.  Patient is able to converse coherently, goal directed thoughts, no distractibility, or pre-occupation.  He also denies suicidal/self-harm/homicidal ideation, psychosis, and paranoia.  Patient answered question appropriately.    There is significant decrease and twisting of neck.    Stay Summary: [name] was admitted to [facility] Continuous Assessment unit for Undifferentiated schizophrenia [facility]) and crisis management.  For medication management patient's Geodon, Benadryl, and Cogentin were discontinued.  Patient was  started on Ingrezza 40 mg daily for tardive dyskinesia and Seroquel 50 mg nightly for psychosis and mood stability.  Patient was also treated with Ativan 1 mg 3 times daily for 6 doses.  There was notable decrease in TD of neck the next morning.  Patient able to communicate appropriately without any thought blocking or delay response.  Patient reporting he feels much better.  Stating he feels like himself.  Patient was given 14-day sample of Ingrezza to allow his primary care psychiatrist to do prior authorization. Patient and his father is aware that prior authorization would need to be done most likely for patient to be able to get medication and both wanted medications started.  [name]'s improvement was monitored by continuous assessment/observation and his report of symptom reduction.  Her emotional and mental status was also monitored by staff.           [name] was evaluated for stability and plans for continued recovery upon discharge.  [name] motivation was an integral factor for scheduling further treatment.  The following was addressed as part of his discharge planning and follow up treatment:  Employment, housing, transportation, bed availability, health status, family support, and any pending legal issues were also considered during his during the continuous assessment/observation.  He was offered further treatment options upon discharge including outpatient psychiatric services for medication management.   [name] will follow up with his primary psychiatrist for medication management.  Patient was also given resources and information to follow-up with [facility] for medication management if chose to switch providers.  [name] was both mentally  and medically stable for discharge denying suicidal/homicidal ideation, auditory/visual/tactile hallucinations, delusional thoughts and paranoia.     Total Time spent with patient: 30  minutes  Past Psychiatric History: See below Past Medical History:  Past Medical History:  Diagnosis Date   Schizophrenia ([place])     Past Surgical History:  Procedure Laterality Date   shunt surgery     Family History:  Family History  Problem Relation Age of Onset   Stroke Mother    Thyroid disease Father    Family Psychiatric History: None reported Social History:  Social History   Substance and Sexual Activity  Alcohol Use Not Currently     Social History   Substance and Sexual Activity  Drug Use Not Currently   Types: Marijuana    Social History   Socioeconomic History   Marital status: Single    Spouse name: Not on file   Number of children: Not on file   Years of education: Not on file   Highest education level: Not on file  Occupational History   Not on file  Tobacco Use   Smoking status: Every Day    Types: Cigars   Smokeless tobacco: Never  Vaping Use   Vaping Use: Former  Substance and Sexual Activity   Alcohol use: Not Currently   Drug use: Not Currently    Types: Marijuana   Sexual activity: Not on file  Other Topics Concern   Not on file  Social History Narrative   Not on file   Social Determinants of Health   Financial Resource Strain: Not on file  Food Insecurity: Not on file  Transportation Needs: Not on file  Physical Activity: Not on file  Stress: Not on file  Social Connections: Not on file   SDOH:  SDOH Screenings   Alcohol Screen: Not on file  Depression (PHQ2-9): Medium Risk   PHQ-2 Score: 11  Financial Resource Strain: Not on file  Food Insecurity: Not on file  Housing: Not on file  Physical Activity: Not on file  Social Connections: Not on file  Stress: Not on file  Tobacco Use: High Risk   Smoking Tobacco Use: Every Day   Smokeless Tobacco Use: Never   Passive Exposure: Not on file  Transportation Needs: Not on file    Tobacco Cessation:  A prescription for an FDA-approved tobacco cessation medication was  offered at discharge and the patient refused  Current Medications:  Current Facility-Administered Medications  Medication Dose Route Frequency Provider Last Rate Last Admin   acetaminophen (TYLENOL) tablet 650 mg  650 mg Oral Q6H PRN Delane Stalling B, NP       alum & mag hydroxide-simeth (MAALOX/MYLANTA) 200-200-20 MG/5ML suspension 30 mL  30 mL Oral Q4H PRN Virgie Kunda B, NP       hydrOXYzine (ATARAX) tablet 25 mg  25 mg Oral TID PRN Alquan Morrish B, NP       LORazepam (ATIVAN) tablet 1 mg  1 mg Oral TID Jamill Wetmore B, NP   1 mg at 02/21/21 0900   magnesium hydroxide (MILK OF MAGNESIA) suspension 30 mL  30 mL Oral Daily PRN Naod Sweetland B, NP       QUEtiapine (SEROQUEL) tablet 50 mg  50 mg Oral QHS Chrsitopher Wik B, NP   50 mg at 02/20/21 2117   traZODone (DESYREL) tablet 50 mg  50 mg Oral QHS PRN Ivannah Zody B, NP       valbenazine (INGREZZA) capsule 40 mg  40 mg  Oral Daily Mckinley Olheiser B, NP   40 mg at 02/21/21 0901   Current Outpatient Medications  Medication Sig Dispense Refill   acetaminophen (TYLENOL) 325 MG tablet Take 650 mg by mouth every 6 (six) hours as needed for headache or mild pain.     folic acid (FOLVITE) 1 MG tablet Take 1 mg by mouth daily.     Vitamin D, Ergocalciferol, (DRISDOL) 1.25 MG (50000 UNIT) CAPS capsule Take 50,000 Units by mouth every Thursday.     hydrOXYzine (ATARAX) 25 MG tablet Take 1 tablet (25 mg total) by mouth 3 (three) times daily as needed for anxiety. 30 tablet 0   QUEtiapine (SEROQUEL) 50 MG tablet Take 1 tablet (50 mg total) by mouth at bedtime. 30 tablet 0   [START ON 02/22/2021] valbenazine (INGREZZA) 40 MG capsule Take 1 capsule (40 mg total) by mouth daily. 30 capsule 0    PTA Medications: (Not in a hospital admission)   Musculoskeletal  Strength & Muscle Tone: within normal limits Gait & Station: normal Patient leans: N/A  Psychiatric Specialty Exam  Presentation  General Appearance: Appropriate for Environment  Eye  Contact:Minimal  Speech:Blocked  Speech Volume:Decreased  Handedness:Right   Mood and Affect  Mood:Depressed  Affect:Blunt; Flat   Thought Process  Thought Processes:Coherent  Descriptions of Associations:-- (Unable to assess at this time)  Orientation:No data recorded Thought Content:Other (comment) (Unable to assess)  Diagnosis of Schizophrenia or Schizoaffective disorder in past: Yes    Hallucinations:Hallucinations: None (Denies)  Ideas of Reference:None  Suicidal Thoughts:Suicidal Thoughts: No  Homicidal Thoughts:Homicidal Thoughts: No   Sensorium  Memory:Immediate Poor; Recent Poor  Judgment:Impaired  Insight:Lacking; [place]  Concentration:Poor  Attention Span:Poor  Recall:Poor  [employer] of Knowledge:Poor  Language:Poor   Psychomotor Activity  Psychomotor Activity:Psychomotor Activity: Psychomotor Retardation; Extrapyramidal Side Effects (EPS) AIMS Completed?: Yes   Assets  Assets:Financial Resources/Insurance; Housing; Leisure Time; Social Support   Sleep  Sleep:Sleep: Poor   Nutritional Assessment (For OBS and FBC admissions only) Has the patient had a weight loss or gain of 10 pounds or more in the last 3 months?: No Has the patient had a decrease in food intake/or appetite?: Yes Does the patient have dental problems?: No Does the patient have eating habits or behaviors that may be indicators of an eating disorder including binging or inducing vomiting?: No Has the patient recently lost weight without trying?: 0 Has the patient been eating poorly because of a decreased appetite?: 1 Malnutrition Screening Tool Score: 1    Physical Exam  Physical Exam Vitals and nursing note reviewed. Exam conducted with a chaperone present.  Constitutional:      General: He is not in acute distress.    Appearance: Normal appearance. He is not ill-appearing.  Cardiovascular:     Rate and Rhythm: Normal rate.  Pulmonary:      Effort: Pulmonary effort is normal.  Musculoskeletal:        General: Normal range of motion.     Cervical back: Normal range of motion.  Skin:    General: Skin is warm and dry.  Neurological:     Mental Status: He is alert and oriented to person, place, and time.  Psychiatric:        Attention and Perception: Attention and perception normal. He does not perceive visual hallucinations.        Mood and Affect: Mood and affect normal.        Speech: Speech normal.  Behavior: Behavior normal. Behavior is cooperative.        Thought Content: Thought content normal. Thought content is not paranoid or delusional. Thought content does not include homicidal or suicidal ideation.   Review of Systems  Constitutional: Negative.   HENT: Negative.    Eyes: Negative.   Respiratory: Negative.    Cardiovascular: Negative.   Gastrointestinal: Negative.   Genitourinary: Negative.   Musculoskeletal:        Reporting he is feeling better and neck is not moving today   Skin: Negative.   Neurological: Negative.   Endo/Heme/Allergies: Negative.   Psychiatric/Behavioral:  Depression: Stable. Hallucinations: Denies. Substance abuse: Denies. Suicidal ideas: Denies. The patient does not have insomnia. Nervous/anxious: Stable.    Blood pressure 117/82, pulse 84, temperature 98 F (36.7 C), temperature source Oral, resp. rate 16, SpO2 99 %. There is no height or weight on file to calculate BMI.  Demographic Factors:  Male  Loss Factors: Decline in physical health  Historical Factors: NA  Risk Reduction Factors:   Sense of responsibility to family, Religious beliefs about death, Living with another person, especially a relative, Positive social support, Positive therapeutic relationship, and Positive coping skills or problem solving skills  Continued Clinical Symptoms:  Schizophrenia:   Paranoid or undifferentiated type  Cognitive Features That Contribute To Risk:  None    Suicide Risk:   Minimal: No identifiable suicidal ideation.  Patients presenting with no risk factors but with morbid ruminations; may be classified as minimal risk based on the severity of the depressive symptoms  Plan Of Care/Follow-up recommendations:  Activity:  As tolerated Other:  Follow up with your primary psychiatrist for medication management  Disposition: No evidence of imminent risk to self or others at present.   Patient does not meet criteria for psychiatric inpatient admission. Supportive therapy provided about ongoing stressors. Discussed crisis plan, support from social network, calling 911, coming to the Emergency Department, and calling Suicide Hotline.    Discharge Instructions      You were given a 14-day sample of Ingrezza 40 mg to be taken once daily.  You will need to follow-up with your primary psychiatrist for prior authorization if needed.  You will be given a 30-day prescription with no refills.   Take all of you medications as prescribed by your mental healthcare provider.  Report any adverse effects and reactions from your medications to your outpatient provider promptly.  Do not engage in alcohol and or illegal drug use while on prescription medicines. Keep all scheduled appointments. This is to ensure that you are getting refills on time and to avoid any interruption in your medication.  If you are unable to keep an appointment call to reschedule.  Be sure to follow up with resources and follow ups given. In the event of worsening symptoms call the crisis hotline, 911, and or go to the nearest emergency department for appropriate evaluation and treatment of symptoms. Follow-up with your primary care provider for your medical issues, concerns and or health care needs.          Huldah Marin, NP 02/21/2021, 1:09 PM

## 2021-02-21 NOTE — ED Notes (Signed)
Appears to be resting quietly with eyes closed , respirations even and unlabored , no distress noted , will continue to monitor for safety °

## 2021-03-06 ENCOUNTER — Telehealth (HOSPITAL_COMMUNITY): Payer: Self-pay | Admitting: Internal Medicine

## 2021-03-06 NOTE — BH Assessment (Signed)
Care Management - [facility]UC Follow Up Discharges   Writer attempted to make contact with patient today and was unsuccessful.  Patient voicemail is full.   Per chart review, patient is followed by Dr. Allayne Butcher with [facility].

## 2021-04-12 ENCOUNTER — Ambulatory Visit (HOSPITAL_COMMUNITY)
Admission: EM | Admit: 2021-04-12 | Discharge: 2021-04-12 | Disposition: A | Discharge status: Home / Self Care | Payer: Medicaid Other | Attending: Family | Admitting: Family

## 2021-04-12 DIAGNOSIS — Z79899 Other long term (current) drug therapy: Secondary | ICD-10-CM | POA: Insufficient documentation

## 2021-04-12 DIAGNOSIS — F203 Undifferentiated schizophrenia: Secondary | ICD-10-CM | POA: Insufficient documentation

## 2021-04-12 DIAGNOSIS — F32A Depression, unspecified: Secondary | ICD-10-CM | POA: Insufficient documentation

## 2021-04-12 DIAGNOSIS — R258 Other abnormal involuntary movements: Secondary | ICD-10-CM | POA: Insufficient documentation

## 2021-04-12 MED ORDER — VALBENAZINE TOSYLATE 40 MG PO CAPS: 40.0000 mg | ORAL_CAPSULE | Freq: Every day | ORAL | 0 refills | Status: DC

## 2021-04-12 NOTE — Discharge Instructions (Signed)

## 2021-04-12 NOTE — Progress Notes (Signed)
?   04/12/21 1656  ?[facility] Triage Screening (Walk-ins at [facility] only)  ?How Did You Hear About [place]? Self  ?What Is the Reason for Your Visit/Call Today? Pt is a 41 yo male who presented voluntarily and accompanied by his father due to Tardive Dyskinesia and periodically not being able to speak. Father was present and participated with pt's permission. Per father, pt is prescribed medications by Dr. Mervyn Skeeters Mengetsu and had a virtual visit with him yesterday. Per father, pt was not able to participate due to his limitations with speech at times. Pt denied with a "no" SI, HI, AVH and drug/alcohol use.  ?How Long Has This Been Causing You Problems? 1 wk - 1 month  ?Have You Recently Had Any Thoughts About Hurting Yourself? No  ?Are You Planning to Commit Suicide/Harm Yourself At This time? No  ?Have you Recently Had Thoughts About Hurting Someone [name]? No  ?Are You Planning To Harm Someone At This Time? No  ?Are you currently experiencing any auditory, visual or other hallucinations? No  ?Have You Used Any Alcohol or Drugs in the Past 24 Hours? No  ?Do you have any current medical co-morbidities that require immediate attention? No  ?Clinician description of patient physical appearance/behavior: Pt seems alert and responds to directions. Pt has a continuing shaking side-to-side of his head. Pt fatehr stated that this movement was due to TD. Pt was only able to respond to questions with a yes/no. Pt did not appear to be responding to internal stimuli. Thought content and mood could not be determined.  ?If access to [facility] was not available, would you have sought care in the Emergency Department? Yes  ?Determination of Need Routine (7 days)  ?Options For Referral Medication Management  ? ?Annalise Mcdiarmid T. Jimmye Norman, MS, [facility], [facility] ?Triage Specialist ? ? ?

## 2021-04-12 NOTE — ED Provider Notes (Signed)
Behavioral Health Urgent Care Medical Screening Exam ? ?Patient Name: [name] ?MRN: [id number] ?Date of Evaluation: 04/12/21 ?Chief Complaint:   ?Diagnosis:  ?Final diagnoses:  ?Undifferentiated schizophrenia ([facility])  ? ? ?History of Present illness: [name] is a 41 y.o. male. Patient presents voluntarily to [facility] behavioral health for walk-in assessment.  Patient is accompanied by his father, Mahamud Solis.  Patient request that his father assist with assessment.  He is minimally verbally responsive during assessment.  Communicates mainly using hand gestures and head nods. ? ?Patient's father shares that patient has been diagnosed with schizophrenia and is followed by outpatient psychiatry at [facility] [address].  Virtual visit scheduled for yesterday with provider, Dr. June Leap.  Patient and his father were unable to join virtual visit, uncertain as to the reason why they were unable to join the visit.  Patient and father request refill for Ingrezza 40 mg.  Additionally patient is prescribed quetiapine 50 mg nightly.  Discussed considering withdrawing medications  causing tardive dyskinesia.  Patient's father reports improvement with quetiapine, shares that patient has been prescribed multiple medications over approximately 15 years..  Patient's father would prefer Ingrezza be refilled, will discuss additional medication updates with Dr. June Leap.  Patient's father shares that patient has been followed by this prescriber for 15 years. ? ?[name] presents with repetitive, apparently involuntary, dystonic movements of head and neck.  When encouraged by his father he is able to briefly suspend these movements and briefly engage in conversation.  Patient's father reports tardive dyskinesia initially began approximately 1 year ago.  He reports patient was prescribed Ingrezza several months ago and while on this medication TD significantly improved. ? ?Patient is assessed face-to-face by nurse  practitioner.  He is seated in assessment area, no acute distress.  He is alert and oriented, cooperative during assessment.  ?He presents with depressed mood, congruent affect. He denies suicidal and homicidal ideations. He contracts verbally for safety with this Clinical research associate. ? He denies auditory and visual hallucinations.  There is no evidence of psychosis/mania or delusional thinking. ? ?[name] resides in [place] with his father, denies access to weapons.  Patient and father endorse average sleep and appetite.  No alcohol or substance use reported. ? ?Patient offered support and encouragement. ?Spoke with patient's father, [name] who agrees with plan to follow-up with established outpatient psychiatry. ? ?Patient's father educated and verbalizes understanding of mental health resources and other crisis services in the community. He is instructed to call 911 and present to the nearest emergency room should he experience any suicidal/homicidal ideation, auditory/visual/hallucinations, or detrimental worsening of his mental health condition.   ? ?Psychiatric Specialty Exam ? ?Presentation  ?General Appearance:Appropriate for Environment ? ?Eye Contact:Minimal ? ?Speech:Blocked ? ?Speech Volume:Other (comment) (no verbal communication) ? ?Handedness:Right ? ? ?Mood and Affect  ?Mood:Depressed ? ?Affect:Depressed ? ? ?Thought Process  ?Thought Processes:Other (comment) (unable to assess) ? ?Descriptions of Associations:-- (Unable to assess at this time) ? ?Orientation:No data recorded ?Thought Content:Other (comment) (Unable to assess) ? Diagnosis of Schizophrenia or Schizoaffective disorder in past: Yes ?  Hallucinations:None (Denies) ? ?Ideas of Reference:None ? ?Suicidal Thoughts:No ? ?Homicidal Thoughts:No ? ? ?Sensorium  ?Memory:Immediate Poor; Recent Poor ? ?Judgment:Impaired ? ?Insight:Lacking; Fair ? ? ?Executive Functions  ?Concentration:Poor ? ?Attention Span:Poor ? ?Recall:Poor ? ?Fund of  Knowledge:Poor ? ?Language:Poor ? ? ?Psychomotor Activity  ?Psychomotor Activity:Extrapyramidal Side Effects (EPS) ?Yes ? ? ?Assets  ?Assets:Financial Resources/Insurance; Housing; Leisure Time; Social Support ? ? ?Sleep  ?Sleep:Poor ? ?Number  of hours: No data recorded ? ?No data recorded ? ?Physical Exam: ?Physical Exam ?Vitals and nursing note reviewed.  ?Constitutional:   ?   Appearance: Normal appearance. He is well-developed.  ?HENT:  ?   Head: Normocephalic and atraumatic.  ?   Nose: Nose normal.  ?Cardiovascular:  ?   Rate and Rhythm: Tachycardia present.  ?Pulmonary:  ?   Effort: Pulmonary effort is normal.  ?Musculoskeletal:     ?   General: Normal range of motion.  ?   Cervical back: Normal range of motion.  ?Skin: ?   General: Skin is warm and dry.  ?Neurological:  ?   Mental Status: He is alert and oriented to person, place, and time.  ?Psychiatric:     ?   Attention and Perception: Attention and perception normal.     ?   Mood and Affect: Affect normal. Mood is depressed.     ?   Speech: Speech is delayed.     ?   Behavior: Behavior is cooperative.     ?   Thought Content: Thought content normal.  ? ?Review of Systems  ?Constitutional: Negative.   ?HENT: Negative.    ?Eyes: Negative.   ?Respiratory: Negative.    ?Cardiovascular: Negative.   ?Gastrointestinal: Negative.   ?Genitourinary: Negative.   ?Musculoskeletal:   ?     Involuntary, repetitive movement of neck left and right  ?Skin: Negative.   ?Neurological: Negative.   ?Endo/Heme/Allergies: Negative.   ?Psychiatric/Behavioral:  Positive for depression.   ?Blood pressure 129/87, pulse (!) 103, temperature 98.4 ?F (36.9 ?C), temperature source Oral, resp. rate 18, SpO2 100 %. There is no height or weight on file to calculate BMI. ? ?Musculoskeletal: ?Strength & Muscle Tone: within normal limits ?Gait & Station: normal ?Patient leans: N/A ? ? ?[facility] [address] M[facility] Discharge Disposition for Follow up and Recommendations: ?Based on my evaluation the patient does  not appear to have an emergency medical condition and can be discharged with resources and follow up care in outpatient services for Medication Management and Individual Therapy ?Patient reviewed with Dr. Bronwen Betters. ?Follow-up with established outpatient psychiatry. ?Medications: ?-Valbenazine 40 mg daily ? ? ?Lenard Lance, FNP ?04/12/2021, 6:32 PM ? ?

## 2021-04-14 ENCOUNTER — Emergency Department (HOSPITAL_COMMUNITY)
Admission: EM | Admit: 2021-04-14 | Discharge: 2021-04-14 | Disposition: A | Discharge status: Home / Self Care | Payer: Medicaid Other | Attending: Emergency Medicine | Admitting: Emergency Medicine

## 2021-04-14 ENCOUNTER — Emergency Department (HOSPITAL_COMMUNITY): Payer: Medicaid Other

## 2021-04-14 ENCOUNTER — Other Ambulatory Visit: Payer: Self-pay

## 2021-04-14 ENCOUNTER — Encounter (HOSPITAL_COMMUNITY): Payer: Self-pay | Admitting: *Deleted

## 2021-04-14 DIAGNOSIS — Z982 Presence of cerebrospinal fluid drainage device: Secondary | ICD-10-CM

## 2021-04-14 DIAGNOSIS — F209 Schizophrenia, unspecified: Secondary | ICD-10-CM

## 2021-04-14 DIAGNOSIS — Z79899 Other long term (current) drug therapy: Secondary | ICD-10-CM | POA: Insufficient documentation

## 2021-04-14 DIAGNOSIS — Y900 Blood alcohol level of less than 20 mg/100 ml: Secondary | ICD-10-CM | POA: Insufficient documentation

## 2021-04-14 DIAGNOSIS — G2401 Drug induced subacute dyskinesia: Secondary | ICD-10-CM

## 2021-04-14 DIAGNOSIS — Z9889 Other specified postprocedural states: Secondary | ICD-10-CM | POA: Insufficient documentation

## 2021-04-14 DIAGNOSIS — D72829 Elevated white blood cell count, unspecified: Secondary | ICD-10-CM | POA: Insufficient documentation

## 2021-04-14 LAB — SALICYLATE LEVEL: Salicylate Lvl: <7 mg/dL — ABNORMAL LOW (ref 7.0–30.0)

## 2021-04-14 LAB — COMPREHENSIVE METABOLIC PANEL
ALT: 23 U/L (ref 0–44)
AST: 34 U/L (ref 15–41)
Albumin: 3.3 g/dL — ABNORMAL LOW (ref 3.5–5.0)
Alkaline Phosphatase: 76 U/L (ref 38–126)
Anion gap: 8 (ref 5–15)
BUN: 5 mg/dL — ABNORMAL LOW (ref 6–20)
CO2: 29 mmol/L (ref 22–32)
Calcium: 9.1 mg/dL (ref 8.9–10.3)
Chloride: 97 mmol/L — ABNORMAL LOW (ref 98–111)
Creatinine, Ser: 0.75 mg/dL (ref 0.61–1.24)
GFR, Estimated: >60 mL/min (ref >60)
Glucose, Bld: 104 mg/dL — ABNORMAL HIGH (ref 70–99)
Potassium: 3.7 mmol/L (ref 3.5–5.1)
Sodium: 134 mmol/L — ABNORMAL LOW (ref 135–145)
Total Bilirubin: 0.2 mg/dL — ABNORMAL LOW (ref 0.3–1.2)
Total Protein: 7.6 g/dL (ref 6.5–8.1)

## 2021-04-14 LAB — RAPID URINE DRUG SCREEN, HOSP PERFORMED
Amphetamines: NOT DETECTED
Barbiturates: NOT DETECTED
Benzodiazepines: NOT DETECTED
Cocaine: NOT DETECTED
Opiates: NOT DETECTED
Tetrahydrocannabinol: NOT DETECTED

## 2021-04-14 LAB — CBC
HCT: 36.7 % — ABNORMAL LOW (ref 39.0–52.0)
Hemoglobin: 12.3 g/dL — ABNORMAL LOW (ref 13.0–17.0)
MCH: 24.8 pg — ABNORMAL LOW (ref 26.0–34.0)
MCHC: 33.5 g/dL (ref 30.0–36.0)
MCV: 74 fL — ABNORMAL LOW (ref 80.0–100.0)
Platelets: 472 10*3/uL — ABNORMAL HIGH (ref 150–400)
RBC: 4.96 MIL/uL (ref 4.22–5.81)
RDW: 14.9 % (ref 11.5–15.5)
WBC: 12 10*3/uL — ABNORMAL HIGH (ref 4.0–10.5)
nRBC: 0 % (ref 0.0–0.2)

## 2021-04-14 LAB — ACETAMINOPHEN LEVEL: Acetaminophen (Tylenol), Serum: <10 ug/mL — ABNORMAL LOW (ref 10–30)

## 2021-04-14 LAB — ETHANOL: Alcohol, Ethyl (B): <10 mg/dL (ref <10)

## 2021-04-14 MED ORDER — VALBENAZINE TOSYLATE 40 MG PO CAPS
40.0000 mg | ORAL_CAPSULE | Freq: Once | ORAL | Status: AC
  Administered 2021-04-14: 40 mg via ORAL
  Filled 2021-04-14: qty 1

## 2021-04-14 NOTE — ED Triage Notes (Signed)
Pt brought here by his father, who he lives with, for altered behavior.  Pt is normally quite verbal and responsive to his father.  However, his father noticed that he has not been taking his medications, hiding them under his tongue.  In triage, pt moving head back and forth, though AO x 4.  Denies pain.  States he stopped taking his seroquel because it hurts his head. ?

## 2021-04-14 NOTE — Discharge Instructions (Signed)
Until you can get the Ingrezza filled would recommend using 25 mg of Benadryl to help with tardive dyskinesia symptoms, you should further discuss medications to manage his TD and schizophrenia with his psychiatrist please call for close follow-up on Monday and let him know that you have received a prescription for Ingrezza but need a prior authorization.  I would recommend he continue taking his Seroquel to avoid withdrawal symptoms. ? ?If he has worsening behavioral issues, agitation, or endorses any suicidal or homicidal thoughts or begins having hallucinations you should return to behavioral health urgent care for reevaluation. ? ?Return to the emergency department for other new or worsening symptoms. ?

## 2021-04-14 NOTE — ED Provider Notes (Signed)
[facility] EMERGENCY DEPARTMENT Provider Note   CSN: [id number] Arrival date & time: 04/14/21  1449     History  Chief Complaint  Patient presents with   Manic Behavior    [name] is a 41 y.o. male.  [name] is a 41 y.o. male with a history of schizophrenia and VP shunt placement, who presents to the emergency department accompanied by his father for medication compliance issues and worsening tardive dyskinesia.  Dad reports he is recently become less verbally responsive over the past month and has started to have worsening of the TD movements.  He reports that he was previously on Ingrezza but they ran out of this medication about a month ago and have been unable to get it refilled until recently.  Dad reports that they reported to [facility] on 3/9 for similar symptoms, at that time they did not feel that patient met criteria for inpatient placement but did represcribe Ingrezza.  Father reports that he took this to the pharmacy but was unable to get it filled due to need for prior authorization and Medicaid approval.  He reports previously he was using Benadryl for PD with inconsistent results.  Father reports that he has not been wanting to take Seroquel which she feels is now starting to impact his sleep, patient reported to him that the Seroquel hurts his head, he currently denies headache, does have a VP shunt in place but dad reports he has not had any issues with this, has not had any recent scalp swelling, last VP shunt series done in October which was normal.  Patient has any suicidal or homicidal ideation Nations, no hallucinations.  The history is provided by the patient, a parent and medical records.      Home Medications Prior to Admission medications   Medication Sig Start Date End Date Taking? Authorizing Provider  acetaminophen (TYLENOL) 325 MG tablet Take 650 mg by mouth every 6 (six) hours as needed for headache or mild pain.    [provider]  folic acid (FOLVITE) 1 MG tablet Take 1 mg by mouth daily. 12/29/20   [provider]  hydrOXYzine (ATARAX) 25 MG tablet Take 1 tablet (25 mg total) by mouth 3 (three) times daily as needed for anxiety. 02/21/21   Rankin, Shuvon B, NP  QUEtiapine (SEROQUEL) 50 MG tablet Take 1 tablet (50 mg total) by mouth at bedtime. 02/21/21   Rankin, Shuvon B, NP  traZODone (DESYREL) 50 MG tablet Take 1 tablet (50 mg total) by mouth at bedtime as needed for sleep. 02/21/21   Rankin, Shuvon B, NP  valbenazine (INGREZZA) 40 MG capsule Take 1 capsule (40 mg total) by mouth daily. 04/12/21   Lucky Rathke, FNP  Vitamin D, Ergocalciferol, (DRISDOL) 1.25 MG (50000 UNIT) CAPS capsule Take 50,000 Units by mouth every Thursday. 12/29/20   [provider]      Allergies    Risperdal [risperidone]    Review of Systems   Review of Systems  Constitutional:  Negative for chills and fever.  Respiratory:  Negative for shortness of breath.   Cardiovascular:  Negative for chest pain.  Gastrointestinal:  Negative for abdominal pain.  Neurological:  Positive for headaches. Negative for seizures, syncope, weakness and numbness.  Psychiatric/Behavioral:  Negative for agitation, hallucinations and suicidal ideas.   All other systems reviewed and are negative.  Physical Exam Updated Vital Signs BP 123/81    Pulse (!) 111    Temp 98.5  F (36.9 C)    Resp 14    Ht 5' 11"  (1.803 m)    Wt 76.2 kg    SpO2 94%    BMI 23.43 kg/m  Physical Exam Vitals and nursing note reviewed.  Constitutional:      General: He is not in acute distress.    Appearance: Normal appearance. He is well-developed. He is not ill-appearing or diaphoretic.  HENT:     Head: Normocephalic and atraumatic.     Comments: Repetitive head turning and mouth movements consistent with tardive dyskinesia    Mouth/Throat:     Mouth: Mucous membranes are moist.     Pharynx: Oropharynx is clear.  Eyes:     General:        Right eye:  No discharge.        Left eye: No discharge.     Extraocular Movements: Extraocular movements intact.     Pupils: Pupils are equal, round, and reactive to light.  Cardiovascular:     Rate and Rhythm: Normal rate and regular rhythm.  Pulmonary:     Effort: Pulmonary effort is normal. No respiratory distress.     Breath sounds: Normal breath sounds.  Abdominal:     General: Bowel sounds are normal.     Palpations: Abdomen is soft.     Tenderness: There is no abdominal tenderness.  Musculoskeletal:     Cervical back: Neck supple.  Neurological:     Mental Status: He is alert and oriented to person, place, and time.     Coordination: Coordination normal.     Comments: Speech is clear, able to follow commands CN III-XII intact Normal strength in upper and lower extremities bilaterally including dorsiflexion and plantar flexion, strong and equal grip strength Sensation normal to light and sharp touch Moves extremities without ataxia, coordination intact  Psychiatric:        Attention and Perception: Attention normal.        Mood and Affect: Mood and affect normal.        Behavior: Behavior normal. Behavior is cooperative.        Thought Content: Thought content normal. Thought content does not include homicidal or suicidal ideation.    ED Results / Procedures / Treatments   Labs (all labs ordered are listed, but only abnormal results are displayed) Labs Reviewed  COMPREHENSIVE METABOLIC PANEL - Abnormal; Notable for the following components:      Result Value   Sodium 134 (*)    Chloride 97 (*)    Glucose, Bld 104 (*)    BUN 5 (*)    Albumin 3.3 (*)    Total Bilirubin 0.2 (*)    All other components within normal limits  SALICYLATE LEVEL - Abnormal; Notable for the following components:   Salicylate Lvl <7.1 (*)    All other components within normal limits  ACETAMINOPHEN LEVEL - Abnormal; Notable for the following components:   Acetaminophen (Tylenol), Serum <10 (*)    All  other components within normal limits  CBC - Abnormal; Notable for the following components:   WBC 12.0 (*)    Hemoglobin 12.3 (*)    HCT 36.7 (*)    MCV 74.0 (*)    MCH 24.8 (*)    Platelets 472 (*)    All other components within normal limits  ETHANOL  RAPID URINE DRUG SCREEN, HOSP PERFORMED    EKG None  Radiology DG Skull 1-3 Views  Result Date: 04/14/2021 CLINICAL DATA:  Evaluate for  shunt malfunction EXAM: SKULL - 1-3 VIEW COMPARISON:  11/28/2020 FINDINGS: There is no evidence of skull fracture or other focal bone lesions. Left posterior ventriculoperitoneal shunt in place. Visualized tubing appears intact. IMPRESSION: Negative. Electronically Signed   By: Rolm Baptise M.D.   On: 04/14/2021 17:59   DG Chest 1 View  Result Date: 04/14/2021 CLINICAL DATA:  Evaluate shunt EXAM: CHEST  1 VIEW COMPARISON:  11/28/2020 FINDINGS: VP shunt catheter seen descending the left chest extending into the abdomen. No evidence of catheter kinking or discontinuity. Abandoned shunt tubing is again seen. Normal heart size. No focal airspace consolidation, pleural effusion, or pneumothorax. The heart size and mediastinal contours are within normal limits. Both lungs are clear. The visualized skeletal structures are unremarkable. IMPRESSION: VP shunt catheter appears intact without evidence of kinking or discontinuity within the chest. Electronically Signed   By: Davina Poke D.O.   On: 04/14/2021 17:58   DG Cervical Spine 1 View  Result Date: 04/14/2021 CLINICAL DATA:  Evaluate for shunt malfunction EXAM: DG CERVICAL SPINE - 1 VIEW COMPARISON:  X-ray cervical spine 11/28/2020 FINDINGS: Similar-appearing left ventriculoperitoneal shunt overlying the left neck and upper chest with no definite kinking or discontinuity. Separate parallel abandoned discontinuous shunt again noted. Multilevel uncovertebral facet degenerative changes. IMPRESSION: Similar-appearing left ventriculoperitoneal shunt overlying the  left neck and upper chest with no definite kinking or discontinuity. Separate parallel abandoned discontinuous shunt again noted. Electronically Signed   By: Iven Finn M.D.   On: 04/14/2021 18:01   DG Abd 1 View  Result Date: 04/14/2021 CLINICAL DATA:  Evaluate for shunt malfunction EXAM: ABDOMEN - 1 VIEW COMPARISON:  11/28/2020 FINDINGS: Left abdominal VP shunt in place which terminates in the pelvis. Devitalized shunt catheter also seen in the left abdominal wall. Moderate stool burden throughout the colon with mild gaseous distention of the colon. No organomegaly or free air. IMPRESSION: Left VP shunt catheter appears intact, terminating in the pelvis. Moderate stool burden and gaseous distention of the colon. Electronically Signed   By: Rolm Baptise M.D.   On: 04/14/2021 18:00   CT Head Wo Contrast  Result Date: 04/14/2021 CLINICAL DATA:  Hydrocephalus, shunted, follow up EXAM: CT HEAD WITHOUT CONTRAST TECHNIQUE: Contiguous axial images were obtained from the base of the skull through the vertex without intravenous contrast. RADIATION DOSE REDUCTION: This exam was performed according to the departmental dose-optimization program which includes automated exposure control, adjustment of the mA and/or kV according to patient size and/or use of iterative reconstruction technique. COMPARISON:  11/28/2020 FINDINGS: Brain: Left posterior parietal ventricular shunt in place. Ventricles are decompressed. 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 No hemorrhage or acute infarct. Vascular: No hyperdense vessel or unexpected calcification. Skull: No acute calvarial abnormality. Sinuses/Orbits: No acute findings Other: None IMPRESSION: Left VP shunt remains in place, unchanged.  No hydrocephalus. No acute intracranial abnormality. Electronically Signed   By: Rolm Baptise M.D.   On: 04/14/2021 17:43    Procedures Procedures    Medications Ordered in ED Medications  valbenazine  (INGREZZA) capsule 40 mg (40 mg Oral Given 04/14/21 1752)    ED Course/ Medical Decision Making/ A&P                           This patient presents to the ED for concern of medication noncompliance and worsening tardive dyskinesia, this involves an extensive number of treatment options, and is a complaint that carries with  it a high risk of complications and morbidity.  Patient has been unable to get his Ingrezza filled to help with tardive dyskinesia, has gotten minimal improvement previously with Benadryl.  Now is not wanting to take his Seroquel nightly and so has had decreased sleep. No SI, HI, hallucinations or agitation Seen at [facility] for same on 3/9, did not meet inpatient criteria  Co morbidities that complicate the patient evaluation  VP shunt, schizophrenia   Additional history obtained:  Additional history obtained from father at bedside External records from outside source obtained and reviewed including recent [facility] evaluation on 3/9   Lab Tests:  I Ordered, and personally interpreted labs.  The pertinent results include: Minimal leukocytosis and stable hemoglobin, no significant electrolyte derangements, normal renal and liver function, negative tox labs   Imaging Studies ordered:  I ordered imaging studies including CT head and VP shunt series I independently visualized and interpreted imaging which showed no hydrocephalus, shunt without kinking or abnormality I agree with the radiologist interpretation   Medicines ordered and prescription drug management:  I ordered medication including Ingrezza  for tardive dyskinesia Reevaluation of the patient after these medicines showed that the patient improved I have reviewed the patients home medicines and have made adjustments as needed   Problem List / ED Course:  Patient presents with father after he has not been taking his medication and has had worsening TD symptoms since running out of his Julio Alm, this was  represcribed at behavioral health urgent care on 3/9 but they have been unable to get it filled due to [facility] approval issues. He is not experiencing any SI, HI or AVH and does not appear acutely decompensated with his history of schizophrenia, has not wanted to take his Seroquel for the past few nights but despite this is calm, cooperative and able to answer all questions, do not feel that he needs an emergent psych consult.  Feel he will need follow-up with his psychiatrist for further medication management. Patient has been complaining of some intermittent headaches, none currently, no focal neurologic deficits but does have a VP shunt, fortunately lab work and CT as well as shunt series are all reassuring. Given dose of Ingrezza here with improvement in TD symptoms, will call psychiatrist on Monday, encourage father to continue to use Benadryl until he can get Ingrezza prescription and to follow-up with [facility] if patient has further decompensation before he can see his psychiatrist. At this time feel patient is appropriate for discharge home with outpatient follow-up and will not require admission for medical or psychiatric issue.    Social Determinants of Health:  Schizophrenia           Final Clinical Impression(s) / ED Diagnoses Final diagnoses:  Schizophrenia, unspecified type ([place])  Tardive dyskinesia  S/P VP shunt    Rx / DC Orders ED Discharge Orders     None         Janet Berlin 04/14/21 2307    Hayden Rasmussen, MD 04/15/21 1035

## 2021-04-17 ENCOUNTER — Telehealth (HOSPITAL_COMMUNITY): Payer: Self-pay | Admitting: Family

## 2021-04-17 NOTE — Telephone Encounter (Cosign Needed)
Received prior authorization request from [facility]: [name] [phone].  ?AIMS score provided via fax as requested. AIMS score scored on 04/12/2021 at 1832pm. ?Patient reviewed with Dr Nelly Rout.  ?

## 2021-08-03 ENCOUNTER — Ambulatory Visit (HOSPITAL_COMMUNITY)
Admission: EM | Admit: 2021-08-03 | Discharge: 2021-08-03 | Disposition: A | Discharge status: Other Acute Inpatient Hospital | Payer: Medicaid Other | Attending: Psychiatry | Admitting: Psychiatry

## 2021-08-03 ENCOUNTER — Emergency Department (HOSPITAL_COMMUNITY)
Admission: EM | Admit: 2021-08-03 | Discharge: 2021-08-04 | Disposition: A | Discharge status: Other Acute Inpatient Hospital | Payer: Medicaid Other | Source: Home / Self Care | Attending: Emergency Medicine | Admitting: Emergency Medicine

## 2021-08-03 DIAGNOSIS — F203 Undifferentiated schizophrenia: Secondary | ICD-10-CM | POA: Insufficient documentation

## 2021-08-03 DIAGNOSIS — G2401 Drug induced subacute dyskinesia: Secondary | ICD-10-CM | POA: Insufficient documentation

## 2021-08-03 DIAGNOSIS — Z982 Presence of cerebrospinal fluid drainage device: Secondary | ICD-10-CM | POA: Diagnosis not present

## 2021-08-03 DIAGNOSIS — Z20822 Contact with and (suspected) exposure to covid-19: Secondary | ICD-10-CM | POA: Insufficient documentation

## 2021-08-03 LAB — CBC WITH DIFFERENTIAL/PLATELET
Abs Immature Granulocytes: 0.02 10*3/uL (ref 0.00–0.07)
Basophils Absolute: 0.1 10*3/uL (ref 0.0–0.1)
Basophils Relative: 1 %
Eosinophils Absolute: 0.1 10*3/uL (ref 0.0–0.5)
Eosinophils Relative: 2 %
HCT: 39.3 % (ref 39.0–52.0)
Hemoglobin: 13.6 g/dL (ref 13.0–17.0)
Immature Granulocytes: 0 %
Lymphocytes Relative: 26 %
Lymphs Abs: 2.2 10*3/uL (ref 0.7–4.0)
MCH: 25.2 pg — ABNORMAL LOW (ref 26.0–34.0)
MCHC: 34.6 g/dL (ref 30.0–36.0)
MCV: 72.9 fL — ABNORMAL LOW (ref 80.0–100.0)
Monocytes Absolute: 0.9 10*3/uL (ref 0.1–1.0)
Monocytes Relative: 11 %
Neutro Abs: 4.9 10*3/uL (ref 1.7–7.7)
Neutrophils Relative %: 60 %
Platelets: 435 10*3/uL — ABNORMAL HIGH (ref 150–400)
RBC: 5.39 MIL/uL (ref 4.22–5.81)
RDW: 15.1 % (ref 11.5–15.5)
WBC: 8.2 10*3/uL (ref 4.0–10.5)
nRBC: 0 % (ref 0.0–0.2)

## 2021-08-03 LAB — COMPREHENSIVE METABOLIC PANEL
ALT: 12 U/L (ref 0–44)
AST: 20 U/L (ref 15–41)
Albumin: 3.9 g/dL (ref 3.5–5.0)
Alkaline Phosphatase: 69 U/L (ref 38–126)
Anion gap: 7 (ref 5–15)
BUN: 9 mg/dL (ref 6–20)
CO2: 26 mmol/L (ref 22–32)
Calcium: 9 mg/dL (ref 8.9–10.3)
Chloride: 110 mmol/L (ref 98–111)
Creatinine, Ser: 0.92 mg/dL (ref 0.61–1.24)
GFR, Estimated: >60 mL/min (ref >60)
Glucose, Bld: 99 mg/dL (ref 70–99)
Potassium: 3.6 mmol/L (ref 3.5–5.1)
Sodium: 143 mmol/L (ref 135–145)
Total Bilirubin: 0.4 mg/dL (ref 0.3–1.2)
Total Protein: 8.3 g/dL — ABNORMAL HIGH (ref 6.5–8.1)

## 2021-08-03 LAB — RESP PANEL BY RT-PCR (FLU A&B, COVID) ARPGX2
Influenza A by PCR: NEGATIVE
Influenza B by PCR: NEGATIVE
SARS Coronavirus 2 by RT PCR: NEGATIVE

## 2021-08-03 LAB — ETHANOL: Alcohol, Ethyl (B): <10 mg/dL (ref <10)

## 2021-08-03 MED ORDER — QUETIAPINE FUMARATE 50 MG PO TABS: 50.0000 mg | ORAL_TABLET | Freq: Every day | ORAL | Status: DC

## 2021-08-03 MED ORDER — ACETAMINOPHEN 325 MG PO TABS: 650.0000 mg | ORAL_TABLET | Freq: Four times a day (QID) | ORAL | Status: DC | PRN

## 2021-08-03 MED ORDER — QUETIAPINE FUMARATE 50 MG PO TABS
50.0000 mg | ORAL_TABLET | Freq: Every day | ORAL | Status: DC
  Administered 2021-08-03: 50 mg via ORAL
  Filled 2021-08-03: qty 1

## 2021-08-03 MED ORDER — VALBENAZINE TOSYLATE 40 MG PO CAPS
40.0000 mg | ORAL_CAPSULE | Freq: Every day | ORAL | Status: DC
  Administered 2021-08-03 – 2021-08-04 (×2): 40 mg via ORAL
  Filled 2021-08-03 (×2): qty 1

## 2021-08-03 MED ORDER — LORAZEPAM 2 MG/ML IJ SOLN
2.0000 mg | Freq: Once | INTRAMUSCULAR | Status: DC
Start: 2021-08-03 — End: 2021-08-03

## 2021-08-03 MED ORDER — VALBENAZINE TOSYLATE 40 MG PO CAPS: 40.0000 mg | ORAL_CAPSULE | Freq: Every day | ORAL | Status: DC

## 2021-08-03 MED ORDER — HYDROXYZINE HCL 25 MG PO TABS
25.0000 mg | ORAL_TABLET | Freq: Three times a day (TID) | ORAL | Status: DC | PRN
  Administered 2021-08-04: 25 mg via ORAL
  Filled 2021-08-03: qty 1

## 2021-08-03 MED ORDER — FOLIC ACID 1 MG PO TABS
1.0000 mg | ORAL_TABLET | Freq: Every day | ORAL | Status: DC
Start: 2021-08-03 — End: 2021-08-03

## 2021-08-03 MED ORDER — MAGNESIUM HYDROXIDE 400 MG/5ML PO SUSP: 30.0000 mL | Freq: Every day | ORAL | Status: DC | PRN

## 2021-08-03 MED ORDER — ONDANSETRON HCL 4 MG PO TABS: 4.0000 mg | ORAL_TABLET | Freq: Three times a day (TID) | ORAL | Status: DC | PRN

## 2021-08-03 MED ORDER — TRAZODONE HCL 50 MG PO TABS: 50.0000 mg | ORAL_TABLET | Freq: Every evening | ORAL | Status: DC | PRN

## 2021-08-03 MED ORDER — HALOPERIDOL LACTATE 5 MG/ML IJ SOLN
5.0000 mg | Freq: Once | INTRAMUSCULAR | Status: AC
  Administered 2021-08-03: 5 mg via INTRAMUSCULAR
  Filled 2021-08-03: qty 1

## 2021-08-03 MED ORDER — FOLIC ACID 1 MG PO TABS
1.0000 mg | ORAL_TABLET | Freq: Every day | ORAL | Status: DC
  Administered 2021-08-03 – 2021-08-04 (×2): 1 mg via ORAL
  Filled 2021-08-03 (×2): qty 1

## 2021-08-03 MED ORDER — VITAMIN D (ERGOCALCIFEROL) 1.25 MG (50000 UNIT) PO CAPS: 50000.0000 [IU] | ORAL_CAPSULE | ORAL | Status: DC

## 2021-08-03 MED ORDER — ACETAMINOPHEN 325 MG PO TABS: 650.0000 mg | ORAL_TABLET | ORAL | Status: DC | PRN

## 2021-08-03 MED ORDER — ALUM & MAG HYDROXIDE-SIMETH 200-200-20 MG/5ML PO SUSP: 30.0000 mL | ORAL | Status: DC | PRN

## 2021-08-03 MED ORDER — LORAZEPAM 2 MG/ML IJ SOLN
2.0000 mg | Freq: Once | INTRAMUSCULAR | Status: AC
Start: 2021-08-03 — End: 2021-08-03
  Administered 2021-08-03: 2 mg via INTRAMUSCULAR
  Filled 2021-08-03: qty 1

## 2021-08-03 MED ORDER — NICOTINE 21 MG/24HR TD PT24: 21.0000 mg | MEDICATED_PATCH | Freq: Every day | TRANSDERMAL | Status: DC

## 2021-08-03 NOTE — ED Triage Notes (Signed)
Pt to ED via [employer] ; IVC from [facility]; apparently pt uncooperative at [facility]; Refusing labs . Per IVC pt has hx of schizophrenia, been off medications for many months, has had unprovoked aggressive behaviors, not caring for him self and paranoid. On assessment pt appears to be responding to internal stimuli, pt denies SI/HI/AVH.

## 2021-08-03 NOTE — ED Triage Notes (Signed)
Pt presents to [facility] escorted by [employer] under IVC. Pt is being seen by provider during triage.

## 2021-08-03 NOTE — ED Provider Notes (Signed)
[name] Provider Note   CSN: [id number] Arrival date & time: 08/03/21  1634     History  Chief Complaint  Patient presents with   IVC    [name] is a 41 y.o. male.  Patient presents to the hospital via police escort for medical clearance for behavioral health urgent care.  Patient agitated and behavioral urgent care and reportedly guarded, looking around room appearing suspicious and paranoid.  Patient refused lab work at [facility].  Patient sent to the emergency department for lab work with plan to transfer back if COVID-negative and medically cleared and if the patient remains calm and not aggressive.  Patient is under IVC with first exam completed  HPI     Home Medications Prior to Admission medications   Medication Sig Start Date End Date Taking? Authorizing Provider  acetaminophen (TYLENOL) 325 MG tablet Take 650 mg by mouth every 6 (six) hours as needed for headache or mild pain.    [provider]  folic acid (FOLVITE) 1 MG tablet Take 1 mg by mouth daily. 12/29/20   [provider]  hydrOXYzine (ATARAX) 25 MG tablet Take 1 tablet (25 mg total) by mouth 3 (three) times daily as needed for anxiety. 02/21/21   Rankin, Shuvon B, NP  QUEtiapine (SEROQUEL) 50 MG tablet Take 1 tablet (50 mg total) by mouth at bedtime. 02/21/21   Rankin, Shuvon B, NP  traZODone (DESYREL) 50 MG tablet Take 1 tablet (50 mg total) by mouth at bedtime as needed for sleep. 02/21/21   Rankin, Shuvon B, NP  valbenazine (INGREZZA) 40 MG capsule Take 1 capsule (40 mg total) by mouth daily. 04/12/21   Lenard Lance, FNP  Vitamin D, Ergocalciferol, (DRISDOL) 1.25 MG (50000 UNIT) CAPS capsule Take 50,000 Units by mouth every Thursday. 12/29/20   [provider]      Allergies    Risperdal [risperidone]    Review of Systems   Review of Systems  Reason unable to perform ROS: Patient refuses to answer questions.    Physical Exam Updated Vital  Signs BP 106/72   Pulse 97   Temp 98.7 F (37.1 C) (Oral)   Resp 16   Ht 6' (1.829 m)   Wt 68 kg   SpO2 96%   BMI 20.34 kg/m  Physical Exam Vitals and nursing note reviewed.  Constitutional:      General: He is not in acute distress. HENT:     Head: Normocephalic.  Eyes:     Conjunctiva/sclera: Conjunctivae normal.  Cardiovascular:     Rate and Rhythm: Normal rate and regular rhythm.  Pulmonary:     Effort: Pulmonary effort is normal.     Breath sounds: Normal breath sounds.  Abdominal:     Palpations: Abdomen is soft.     Tenderness: There is no abdominal tenderness.  Musculoskeletal:        General: Normal range of motion.     Cervical back: Normal range of motion and neck supple.  Skin:    General: Skin is warm and dry.     Capillary Refill: Capillary refill takes less than 2 seconds.  Neurological:     Mental Status: He is alert.  Psychiatric:     Comments: Patient appears anxious and agitated and refuses to answer questions     ED Results / Procedures / Treatments   Labs (all labs ordered are listed, but only abnormal results are displayed) Labs Reviewed  COMPREHENSIVE METABOLIC PANEL -  Abnormal; Notable for the following components:      Result Value   Total Protein 8.3 (*)    All other components within normal limits  CBC WITH DIFFERENTIAL/PLATELET - Abnormal; Notable for the following components:   MCV 72.9 (*)    MCH 25.2 (*)    Platelets 435 (*)    All other components within normal limits  RESP PANEL BY RT-PCR (FLU A&B, COVID) [id number]  ETHANOL  RAPID URINE DRUG SCREEN, HOSP PERFORMED    EKG None  Radiology No results found.  Procedures Procedures    Medications Ordered in ED Medications  folic acid (FOLVITE) tablet 1 mg (has no administration in time range)  hydrOXYzine (ATARAX) tablet 25 mg (has no administration in time range)  QUEtiapine (SEROQUEL) tablet 50 mg (has no administration in time range)  traZODone (DESYREL) tablet 50 mg  (has no administration in time range)  valbenazine (INGREZZA) capsule 40 mg (has no administration in time range)  Vitamin D (Ergocalciferol) (DRISDOL) capsule 50,000 Units (has no administration in time range)  acetaminophen (TYLENOL) tablet 650 mg (has no administration in time range)  ondansetron (ZOFRAN) tablet 4 mg (has no administration in time range)  haloperidol lactate (HALDOL) injection 5 mg (5 mg Intramuscular Given 08/03/21 1913)  LORazepam (ATIVAN) injection 2 mg (2 mg Intramuscular Given 08/03/21 1914)    ED Course/ Medical Decision Making/ A&P                           Medical Decision Making Amount and/or Complexity of Data Reviewed Labs: ordered.  Risk Prescription drug management.   Patient presents to the emergency department for medical clearance from behavioral health urgent care.  Patient is uncooperative and will not allow staff to draw labs or perform COVID swab.  I ordered Haldol and Ativan to help sedate the patient and had the patient placed in restraints so work-up could be completed.  I ordered and reviewed labs.  CBC and CMP unremarkable.  Ethanol less than 10.  COVID and influenza screens negative.  UDS pending collection at this time  I ordered an EKG. Underlying rhythm is sinus rhythm  Patient clear medically for psychiatric evaluation pending UDS.   TTS consult placed. Home medications ordered.         Final Clinical Impression(s) / ED Diagnoses Final diagnoses:  None    Rx / DC Orders ED Discharge Orders     None         Pamala Duffel 08/03/21 2158    Alvira Monday, MD 08/04/21 1225

## 2021-08-03 NOTE — ED Notes (Signed)
Pt refused to allow this nurse to obtain a covid swab. Informed the provider.

## 2021-08-03 NOTE — ED Notes (Signed)
IVC. Dx: scza. Restarted home seroquel 50mg 

## 2021-08-03 NOTE — ED Notes (Signed)
Patient currently refuses a complete set of vitals.

## 2021-08-03 NOTE — ED Notes (Signed)
Patient will not allow staff to get blood.

## 2021-08-03 NOTE — ED Notes (Signed)
Pt is refusing vitals, covid swab and ekg, after mini attempts I was able to get labs

## 2021-08-03 NOTE — ED Notes (Signed)
[employer] arrived to transfer Pt to [facility]. IVC and emtala provided. Pt escorted to the sally port to d/c from facility. Safety maintained. Report previously called.
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
## 2021-08-03 NOTE — ED Notes (Signed)
Report called to [facility] RN at [facility]. EMTALA printed for transport. IVC papers in envelope.

## 2021-08-04 ENCOUNTER — Other Ambulatory Visit: Payer: Self-pay | Admitting: Psychiatry

## 2021-08-04 ENCOUNTER — Inpatient Hospital Stay (HOSPITAL_COMMUNITY)
Admission: AD | Admit: 2021-08-04 | Discharge: 2021-08-15 | DRG: 885 | Disposition: A | Discharge status: Home / Self Care | Payer: Medicaid Other | Source: Intra-hospital | Attending: Psychiatry | Admitting: Psychiatry

## 2021-08-04 ENCOUNTER — Encounter (HOSPITAL_COMMUNITY): Payer: Self-pay | Admitting: Psychiatry

## 2021-08-04 ENCOUNTER — Other Ambulatory Visit: Payer: Self-pay

## 2021-08-04 DIAGNOSIS — Z79899 Other long term (current) drug therapy: Secondary | ICD-10-CM

## 2021-08-04 DIAGNOSIS — Z560 Unemployment, unspecified: Secondary | ICD-10-CM

## 2021-08-04 DIAGNOSIS — Z888 Allergy status to other drugs, medicaments and biological substances status: Secondary | ICD-10-CM

## 2021-08-04 DIAGNOSIS — Z79891 Long term (current) use of opiate analgesic: Secondary | ICD-10-CM | POA: Diagnosis not present

## 2021-08-04 DIAGNOSIS — G2401 Drug induced subacute dyskinesia: Secondary | ICD-10-CM | POA: Diagnosis present

## 2021-08-04 DIAGNOSIS — F17290 Nicotine dependence, other tobacco product, uncomplicated: Secondary | ICD-10-CM | POA: Diagnosis present

## 2021-08-04 DIAGNOSIS — S0083XA Contusion of other part of head, initial encounter: Secondary | ICD-10-CM | POA: Diagnosis not present

## 2021-08-04 DIAGNOSIS — F200 Paranoid schizophrenia: Secondary | ICD-10-CM | POA: Diagnosis present

## 2021-08-04 DIAGNOSIS — F209 Schizophrenia, unspecified: Secondary | ICD-10-CM | POA: Diagnosis present

## 2021-08-04 DIAGNOSIS — Z20822 Contact with and (suspected) exposure to covid-19: Secondary | ICD-10-CM | POA: Diagnosis present

## 2021-08-04 DIAGNOSIS — Z982 Presence of cerebrospinal fluid drainage device: Secondary | ICD-10-CM | POA: Diagnosis not present

## 2021-08-04 DIAGNOSIS — F201 Disorganized schizophrenia: Secondary | ICD-10-CM | POA: Diagnosis not present

## 2021-08-04 DIAGNOSIS — Z781 Physical restraint status: Secondary | ICD-10-CM | POA: Diagnosis not present

## 2021-08-04 DIAGNOSIS — G919 Hydrocephalus, unspecified: Secondary | ICD-10-CM | POA: Diagnosis present

## 2021-08-04 DIAGNOSIS — G4700 Insomnia, unspecified: Secondary | ICD-10-CM | POA: Diagnosis not present

## 2021-08-04 LAB — RAPID URINE DRUG SCREEN, HOSP PERFORMED
Amphetamines: NOT DETECTED
Barbiturates: NOT DETECTED
Benzodiazepines: NOT DETECTED
Cocaine: NOT DETECTED
Opiates: NOT DETECTED
Tetrahydrocannabinol: NOT DETECTED

## 2021-08-04 MED ORDER — OLANZAPINE 10 MG PO TBDP
10.0000 mg | ORAL_TABLET | Freq: Every day | ORAL | Status: DC
  Administered 2021-08-04: 10 mg via ORAL
  Filled 2021-08-04: qty 1

## 2021-08-04 MED ORDER — OLANZAPINE 10 MG PO TBDP
10.0000 mg | ORAL_TABLET | Freq: Every day | ORAL | Status: DC
  Administered 2021-08-05 – 2021-08-08 (×4): 10 mg via ORAL
  Filled 2021-08-04 (×6): qty 1
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
## 2021-08-04 NOTE — ED Notes (Signed)
Patient took medication.   Patient continues to be paranoid and suspicious,  looking packaging and things given to him.  Not responding or talking to staff.

## 2021-08-04 NOTE — Tx Team (Signed)
Initial Treatment Plan 08/04/2021 4:39 PM [name] CBS:[id number]    PATIENT STRESSORS: Financial difficulties   Health problems   Medication change or noncompliance     PATIENT STRENGTHS: Supportive family/friends    PATIENT IDENTIFIED PROBLEMS: Paranoia  Depression  Medication noncompliance  Psychosis               DISCHARGE CRITERIA:  Adequate post-discharge living arrangements Safe-care adequate arrangements made  PRELIMINARY DISCHARGE PLAN: Outpatient therapy Return to previous living arrangement  PATIENT/FAMILY INVOLVEMENT: This treatment plan has been presented to and reviewed with the patient, [name], and/or family member.  The patient and family have been given the opportunity to ask questions and make suggestions.  Clarene Critchley, RN 08/04/2021, 4:39 PM

## 2021-08-04 NOTE — Progress Notes (Signed)
Adult Psychoeducational Group Note  Date:  08/04/2021 Time:  9:28 PM  Group Topic/Focus:  Wrap-Up Group:   The focus of this group is to help patients review their daily goal of treatment and discuss progress on daily workbooks.  Participation Level:  Minimal  Participation Quality:  Appropriate  Affect:  Appropriate  Cognitive:  Appropriate  Insight: Appropriate  Engagement in Group:  Improving  Modes of Intervention:  Discussion  Additional Comments:  Pt stated his goal for today was to focus on his treatment plan. Pt stated he accomplished his goal today. Pt stated he did not talked with his doctor or with his social worker about his care today. Pt rated his overall day a 3 out of 10. Pt stated he made no calls today. Pt stated he felt better about himself today. Pt stated he was able to attend all meals. Pt stated he took all medications provided today. Pt stated his appetite was pretty good today. Pt rated sleep last night was pretty good. Pt stated the goal tonight was to get some rest. Pt stated he had no physical pain tonight. Pt deny visual hallucinations and auditory issues tonight. Pt denies thoughts of harming himself or others. Pt stated he would alert staff if anything changed  Felipa Furnace 08/04/2021, 9:28 PM

## 2021-08-04 NOTE — ED Provider Notes (Signed)
Emergency Medicine Observation Re-evaluation Note  [name] is a 41 y.o. male, seen on rounds today.  Pt initially presented to the ED for complaints of IVC Currently, the patient is resting comfortably.  Physical Exam  BP 120/77 (BP Location: Right Arm)   Pulse 70   Temp (!) 97.4 F (36.3 C) (Oral)   Resp 18   Ht 6' (1.829 m)   Wt 68 kg   SpO2 99%   BMI 20.34 kg/m  Physical Exam General: No acute distress Cardiac: Regular rate Lungs: No respiratory distress Psych: Calm, withdrawn  ED Course / MDM  EKG:   I have reviewed the labs performed to date as well as medications administered while in observation.  Recent changes in the last 24 hours include no acute changes, patient remains withdrawn and not participating in care plan per nursing notes.  Plan  Current plan is for patient to be assessed by psychiatry team and be admitted. [name] is under involuntary commitment.  He is involuntary commitment was continued.      Derwood Kaplan, MD 08/04/21 [phone]

## 2021-08-04 NOTE — ED Notes (Signed)
Patient walking around room, coming to door at times.  Patient not making eye contact.  Patient not answering questions or responding to staff.  Patient did take medication.

## 2021-08-04 NOTE — ED Notes (Signed)
[facility] provider. Patient alert, cooperative with directions. DC information and belongings given to  [employer] for transport. Patient ambulatory off unit, escorted and transported by [employer].
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
## 2021-08-05 MED ORDER — VALBENAZINE TOSYLATE 40 MG PO CAPS
40.0000 mg | ORAL_CAPSULE | Freq: Every day | ORAL | Status: DC
  Administered 2021-08-05 – 2021-08-15 (×11): 40 mg via ORAL
  Filled 2021-08-05 (×15): qty 1

## 2021-08-05 MED ORDER — ALUM & MAG HYDROXIDE-SIMETH 200-200-20 MG/5ML PO SUSP: 30.0000 mL | Freq: Four times a day (QID) | ORAL | Status: DC | PRN

## 2021-08-05 MED ORDER — HYDROXYZINE HCL 25 MG PO TABS
25.0000 mg | ORAL_TABLET | Freq: Three times a day (TID) | ORAL | Status: DC | PRN
Start: 2021-08-05 — End: 2021-08-15
  Administered 2021-08-05 – 2021-08-10 (×5): 25 mg via ORAL
  Filled 2021-08-05 (×5): qty 1

## 2021-08-05 MED ORDER — LORAZEPAM 1 MG PO TABS: 1.0000 mg | ORAL_TABLET | ORAL | Status: DC | PRN

## 2021-08-05 MED ORDER — ACETAMINOPHEN 325 MG PO TABS: 650.0000 mg | ORAL_TABLET | Freq: Four times a day (QID) | ORAL | Status: DC | PRN

## 2021-08-05 MED ORDER — TRAZODONE HCL 50 MG PO TABS
50.0000 mg | ORAL_TABLET | Freq: Once | ORAL | Status: AC
  Administered 2021-08-05: 50 mg via ORAL
  Filled 2021-08-05 (×2): qty 1

## 2021-08-05 MED ORDER — MAGNESIUM HYDROXIDE 400 MG/5ML PO SUSP: 15.0000 mL | Freq: Every day | ORAL | Status: DC | PRN

## 2021-08-05 MED ORDER — TRAZODONE HCL 50 MG PO TABS
50.0000 mg | ORAL_TABLET | Freq: Every evening | ORAL | Status: DC | PRN
  Administered 2021-08-05 – 2021-08-06 (×2): 50 mg via ORAL
  Filled 2021-08-05 (×2): qty 1

## 2021-08-05 MED ORDER — OLANZAPINE 10 MG PO TBDP: 10.0000 mg | ORAL_TABLET | Freq: Three times a day (TID) | ORAL | Status: DC | PRN

## 2021-08-05 MED ORDER — ZIPRASIDONE MESYLATE 20 MG IM SOLR: 20.0000 mg | INTRAMUSCULAR | Status: DC | PRN

## 2021-08-05 NOTE — Group Note (Signed)
[facility] LCSW Group Therapy Note  Date/Time:  08/05/2021  11:00AM-12:00PM  Type of Therapy and Topic:  Group Therapy:  Music and Mood  Participation Level:  Minimal   Description of Group: In this process group, members listened to a variety of genres of music and identified that different types of music evoke different responses.  Patients were encouraged to identify music that was soothing for them and music that was energizing for them.  Patients discussed how this knowledge can help with wellness and recovery in various ways including managing depression and anxiety as well as encouraging healthy sleep habits.    Therapeutic Goals: Patients will explore the impact of different varieties of music on mood Patients will verbalize the thoughts they have when listening to different types of music Patients will identify music that is soothing to them as well as music that is energizing to them Patients will discuss how to use this knowledge to assist in maintaining wellness and recovery Patients will explore the use of music as a coping skill  Summary of Patient Progress:  At the beginning of group, patient was not present.  At the end of group, patient also was not present.  However in between he did attend, kept getting up to leave, then would turn around at the doorway and come back.   As this action repeated itself many time, his confusion seems the prevalent presentation.    Therapeutic Modalities: Solution Focused Brief Therapy Activity   Ambrose Mantle, LCSW

## 2021-08-05 NOTE — Progress Notes (Signed)
Patient has been up in the dayroom watching tv. He attended group this evening and has voiced no complaints. Patient currently denies having pain, -si/hi/a/v hall. Support and encouragement offered, safety maintained on unit, will continue to monitor.

## 2021-08-05 NOTE — Progress Notes (Signed)
Adult Psychoeducational Group Note  Date:  08/05/2021 Time:  10:45 PM  Group Topic/Focus:  Wrap-Up Group:   The focus of this group is to help patients review their daily goal of treatment and discuss progress on daily workbooks.  Participation Level:  Active  Participation Quality:  Appropriate  Affect:  Appropriate  Cognitive:  Disorganized and Confused  Insight: Limited  Engagement in Group:  Limited  Modes of Intervention:  Discussion  Additional Comments:  Pt stated his goal for today was to focus on his treatment plan. Pt stated he accomplished his goal today. Pt stated he talked with his doctor and with his social worker about his care today. Pt rated his overall day a 8 out of 10. Pt stated he made no calls today. Pt stated he felt better about himself today. Pt stated he was able to attend all meals. Pt stated he took all medications provided today. Pt stated his appetite was pretty good today. Pt rated sleep last night was pretty good. Pt stated the goal tonight was to get some rest. Pt stated he had no physical pain tonight. Pt deny visual hallucinations and auditory issues tonight. Pt denies thoughts of harming himself or others. Pt stated he would alert staff if anything changed  Felipa Furnace 08/05/2021, 10:45 PM

## 2021-08-05 NOTE — Progress Notes (Signed)
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
## 2021-08-05 NOTE — Progress Notes (Signed)
   08/05/21 2300  Psych Admission Type (Psych Patients Only)  Admission Status Involuntary  Psychosocial Assessment  Patient Complaints None  Eye Contact Staring  Facial Expression Flat  Affect Flat  Speech Logical/coherent  Interaction Minimal;No initiation  Motor Activity Unsteady  Appearance/Hygiene Improved  Behavior Characteristics Cooperative  Mood Pleasant  Thought Process  Coherency WDL  Content UTA  Delusions None reported or observed  Perception WDL  Hallucination UTA  Judgment Poor  Confusion None  Danger to Self  Current suicidal ideation? Denies  Danger to Others  Danger to Others None reported or observed

## 2021-08-05 NOTE — [facility] Group Notes (Signed)
Adult Psychoeducational Group Note Date:  08/05/2021 Time:  0900-1045 Group Topic/Focus: PROGRESSIVE RELAXATION. A group where deep breathing is taught and tensing and relaxation muscle groups is used. Imagery is used as well.  Pts are asked to imagine 3 pillars that hold them up when they are not able to hold themselves up and to share that with the group.  Participation Level:  Active  Participation Quality:  Appropriate  Affect:  Appropriate  Cognitive:  Oriented  Insight: Improving  Engagement in Group:  Engaged  Modes of Intervention:  Activity, Discussion, Education, and Support  Additional Comments:  Rates her energy at a 10/10. States exercise, friends and family.  Dione Housekeeper
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
## 2021-08-05 NOTE — Progress Notes (Signed)
   08/05/21 0900  Psych Admission Type (Psych Patients Only)  Admission Status Involuntary  Psychosocial Assessment  Patient Complaints None  Eye Contact Poor  Facial Expression Pensive  Affect Flat  Speech Logical/coherent  Interaction Forwards little  Motor Activity Slow;Unsteady  Appearance/Hygiene In scrubs  Behavior Characteristics Cooperative  Mood Pleasant  Thought Process  Coherency WDL  Content WDL  Delusions WDL  Perception WDL  Hallucination UTA  Judgment Impaired  Confusion WDL  Danger to Self  Current suicidal ideation? Denies  Danger to Others  Danger to Others None reported or observed
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
## 2021-08-06 ENCOUNTER — Encounter (HOSPITAL_COMMUNITY): Payer: Self-pay

## 2021-08-06 ENCOUNTER — Inpatient Hospital Stay (HOSPITAL_COMMUNITY): Payer: Self-pay

## 2021-08-06 ENCOUNTER — Encounter (HOSPITAL_COMMUNITY): Payer: Self-pay | Admitting: Psychiatry

## 2021-08-06 LAB — LIPID PANEL
Cholesterol: 126 mg/dL (ref 0–200)
HDL: 45 mg/dL (ref >40)
LDL Cholesterol: 73 mg/dL (ref 0–99)
Total CHOL/HDL Ratio: 2.8 RATIO
Triglycerides: 41 mg/dL (ref <150)
VLDL: 8 mg/dL (ref 0–40)

## 2021-08-06 LAB — TSH: TSH: 1.335 u[IU]/mL (ref 0.350–4.500)

## 2021-08-06 LAB — HEMOGLOBIN A1C
Hgb A1c MFr Bld: 5.4 % (ref 4.8–5.6)
Mean Plasma Glucose: 108.28 mg/dL

## 2021-08-06 NOTE — Progress Notes (Signed)
Adult Psychoeducational Group Note  Date:  08/06/2021 Time:  10:31 PM  Group Topic/Focus:  Wrap-Up Group:   The focus of this group is to help patients review their daily goal of treatment and discuss progress on daily workbooks.  Participation Level:  Active  Participation Quality:  Appropriate  Affect:  Appropriate  Cognitive:  Appropriate  Insight: Appropriate  Engagement in Group:  Developing/Improving  Modes of Intervention:  Discussion  Additional Comments:  Pt stated his goal for today was to focus on his treatment plan. Pt stated he accomplished his goal today. Pt stated he talked with his doctor and with his social worker about his care today. Pt rated his overall day a 8 out of 10. Pt stated he made no calls today. Pt stated he felt better about himself today. Pt stated he was able to attend all meals. Pt stated he took all medications provided today. Pt stated his appetite was pretty good today. Pt rated sleep last night was pretty good. Pt stated the goal tonight was to get some rest. Pt stated he had no physical pain tonight. Pt deny visual hallucinations and auditory issues tonight. Pt denies thoughts of harming himself or others. Pt stated he would alert staff if anything changed  Felipa Furnace 08/06/2021, 10:31 PM

## 2021-08-06 NOTE — Progress Notes (Signed)
   08/06/21 2000  Psych Admission Type (Psych Patients Only)  Admission Status Involuntary  Psychosocial Assessment  Patient Complaints Suspiciousness  Eye Contact Staring  Facial Expression Flat  Affect Flat  Speech Logical/coherent  Interaction Cautious  Motor Activity Tremors;Slow  Appearance/Hygiene Disheveled;Body odor  Behavior Characteristics Cooperative  Mood Preoccupied  Aggressive Behavior  Effect No apparent injury  Thought Process  Coherency WDL;Disorganized  Content Preoccupation  Delusions Paranoid  Perception Hallucinations  Hallucination Auditory  Judgment Limited  Confusion None  Danger to Self  Current suicidal ideation? Denies  Danger to Others  Danger to Others None reported or observed

## 2021-08-06 NOTE — Progress Notes (Signed)
Note: Patient returned from [facility].  Patient is alert and oriented to person and place.  No acute distress noted.
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
## 2021-08-06 NOTE — Progress Notes (Signed)
Adult Psychoeducational Group Note  Date:  08/06/2021 Time:  9:19 AM  Group Topic/Focus:  Building Self Esteem:   The Focus of this group is helping patients become aware of the effects of self-esteem on their lives, the things they and others do that enhance or undermine their self-esteem, seeing the relationship between their level of self-esteem and the choices they make and learning ways to enhance self-esteem.  Participation Level:  Active  Participation Quality:  Appropriate  Affect:  Appropriate  Cognitive:  Appropriate  Insight: Appropriate  Engagement in Group:  Engaged  Modes of Intervention:  Discussion  Additional Comments: The patient engaged in discussion in group.  Octavio Manns 08/06/2021, 9:19 AM
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
## 2021-08-07 DIAGNOSIS — F201 Disorganized schizophrenia: Secondary | ICD-10-CM

## 2021-08-07 MED ORDER — TRAZODONE HCL 100 MG PO TABS
100.0000 mg | ORAL_TABLET | Freq: Every day | ORAL | Status: DC
  Administered 2021-08-07: 100 mg via ORAL
  Filled 2021-08-07 (×3): qty 1
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
## 2021-08-07 NOTE — Progress Notes (Signed)
Pt stated he had good day, pt stated he felt a little better, pt visible in the dayroom much of the evening, pt a little more vocal this evening    08/07/21 2100  Psych Admission Type (Psych Patients Only)  Admission Status Involuntary  Psychosocial Assessment  Patient Complaints None  Eye Contact Staring  Facial Expression Flat  Affect Flat  Speech Logical/coherent  Interaction Cautious  Motor Activity Tremors;Slow  Appearance/Hygiene Disheveled;Body odor  Behavior Characteristics Cooperative  Mood Suspicious;Preoccupied  Aggressive Behavior  Effect No apparent injury  Thought Process  Coherency WDL;Disorganized  Content Preoccupation  Delusions Paranoid  Perception Hallucinations  Hallucination Auditory  Judgment Limited  Confusion None  Danger to Self  Current suicidal ideation? Denies  Danger to Others  Danger to Others None reported or observed

## 2021-08-07 NOTE — Progress Notes (Signed)
D. Pt observed sitting quietly in the dayroom with minimal interaction between peers. Pt presents as depressed, but has been calm and cooperative on the unit- no behavioral issues noted. Per pt's self inventory, pt rated his depression,hopelessness and anxiety all 0's today. Pt wrote that his goal was to "get home, get and maintain a job." Pt currently denies SI/HI, and AVH, but when pt was asked about A/VH, he responded, "I think I talk to myself too much." Pt has been observed by staff talking to himself.  . A. Labs and vitals monitored. Pt given and educated on medications. Pt supported emotionally and encouraged to express concerns and ask questions.   R. Pt remains safe with 15 minute checks. Will continue POC.

## 2021-08-07 NOTE — Progress Notes (Signed)
   08/07/21 0545  Sleep  Number of Hours 3.25

## 2021-08-07 NOTE — Progress Notes (Signed)
The focus of this group is to help patients review their daily goal of treatment and discuss progress on daily workbooks.  Pt attended the evening group and responded to all discussion prompts from the Writer. Pt shared that today was a good day on the unit, the highlight of which was "still waking up this morning."  Pt told that his goal for the week was to get a job so that he can support himself.  Pt rated his day a 7 out of 10 and he appeared distracted at times during group. This included having side conversations with people who weren't present in the room.

## 2021-08-08 ENCOUNTER — Encounter (HOSPITAL_COMMUNITY): Payer: Self-pay

## 2021-08-08 MED ORDER — TEMAZEPAM 15 MG PO CAPS
15.0000 mg | ORAL_CAPSULE | Freq: Every day | ORAL | Status: DC
  Administered 2021-08-08 – 2021-08-09 (×2): 15 mg via ORAL
  Filled 2021-08-08 (×2): qty 1

## 2021-08-08 MED ORDER — OLANZAPINE 15 MG PO TBDP
15.0000 mg | ORAL_TABLET | Freq: Every day | ORAL | Status: DC
  Administered 2021-08-09: 15 mg via ORAL
  Filled 2021-08-08 (×2): qty 1

## 2021-08-08 MED ORDER — OLANZAPINE 5 MG PO TBDP
5.0000 mg | ORAL_TABLET | Freq: Every day | ORAL | Status: AC
  Administered 2021-08-08: 5 mg via ORAL
  Filled 2021-08-08: qty 1
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
## 2021-08-08 NOTE — [facility] Suicide Risk Assessment (Signed)
[facility] INPATIENT:  Family/Significant Other Suicide Prevention Education  Suicide Prevention Education:  Education Completed; [name],  [phone]  (name of family member/significant other) has been identified by the patient as the family member/significant other with whom the patient will be residing, and identified as the person(s) who will aid the patient in the event of a mental health crisis (suicidal ideations/suicide attempt).  With written consent from the patient, the family member/significant other has been provided the following suicide prevention education, prior to the and/or following the discharge of the patient.  CSW spoke with patient father and he reports that patient has been declining since March 2023.  Father reports patient often sociall y isolates himself and has never been interested in his social environment.  Patient lives with great niece, niece and his father.  Patient room is disorganized and doesn't take care of himself.  Patient eats unhealthily and doesn't take care of hygeine.  Patient usually has an interest in music and the keyboard but he stopped playing.  He has also stopped writing and making art.  Patient has become increasingly more agitated and talking to himself.  Patient accuses father of scolding and ridiculing.  He also stopped going out to buy sodas and cigarettes.  Father also reports that he lost his uncle unexpectently in May and Kateri Mc was one of the only people that he talked with.  No guns/weapons in the house. Father also securing Patent attorney for safety.  Father has no additional safety concerns at this time.   The suicide prevention education provided includes the following: Suicide risk factors Suicide prevention and interventions National Suicide Hotline telephone number [facility] assessment telephone number [facility] Emergency Assistance 911 [facility] and/or Residential Mobile Crisis Unit telephone number  Request  made of family/significant other to: Remove weapons (e.g., guns, rifles, knives), all items previously/currently identified as safety concern.   Remove drugs/medications (over-the-counter, prescriptions, illicit drugs), all items previously/currently identified as a safety concern.  The family member/significant other verbalizes understanding of the suicide prevention education information provided.  The family member/significant other agrees to remove the items of safety concern listed above.  [name] 08/08/2021, 2:57 PM

## 2021-08-08 NOTE — Group Note (Signed)
Recreation Therapy Group Note   Group Topic:Problem Solving  Group Date: 08/08/2021 Start Time: 1005 End Time: 1030 Facilitators: Caroll Rancher, LRT,CTRS Location: [address] Dayroom   Goal Area(s) Addresses:  Patient will effectively work with peer towards shared goal.  Patient will identify skills used to make activity successful.  Patient will identify how skills used during activity can be used to reach post d/c goals.    Group Description:  Straw Bridge. In teams of 3-5, patients were given 15 plastic drinking straws and an equal length of masking tape. Using the materials provided, patients were instructed to build a free standing bridge-like structure to suspend an everyday item (ex: puzzle box) off of the floor or table surface. All materials were required to be used by the team in their design. LRT facilitated post-activity discussion reviewing team process. Patients were encouraged to reflect how the skills used in this activity can be generalized to daily life post discharge.   Affect/Mood: Sleepy   Participation Level: None   Participation Quality: None   Behavior: Calm and Drowsy   Speech/Thought Process: None   Insight: None   Judgement: None   Modes of Intervention: Problem-solving   Patient Response to Interventions:  Disengaged   Education Outcome:  Acknowledges education and In group clarification offered    Clinical Observations/Individualized Feedback: Pt sat quietly.  Pt mostly had eyes closed as if sleeping.  Pt seemed confused at points during group.     Plan: Continue to engage patient in RT group sessions 2-3x/week.   Caroll Rancher, LRT,CTRS 08/08/2021 12:44 PM

## 2021-08-08 NOTE — BH IP Treatment Plan (Signed)
Interdisciplinary Treatment and Diagnostic Plan Update  08/08/2021 Time of Session: 10:00am  [name] MRN: [id number]  Principal Diagnosis: Schizophrenia [facility])  Secondary Diagnoses: Principal Problem:   Schizophrenia ([place])   Current Medications:  Current Facility-Administered Medications  Medication Dose Route Frequency Provider Last Rate Last Admin   acetaminophen (TYLENOL) tablet 650 mg  650 mg Oral Q6H PRN Briant Cedar, MD       alum & mag hydroxide-simeth (MAALOX/MYLANTA) 200-200-20 MG/5ML suspension 30 mL  30 mL Oral Q6H PRN Briant Cedar, MD       hydrOXYzine (ATARAX) tablet 25 mg  25 mg Oral TID PRN Briant Cedar, MD   25 mg at 08/07/21 2052   OLANZapine zydis (ZYPREXA) disintegrating tablet 10 mg  10 mg Oral Q8H PRN Briant Cedar, MD       And   LORazepam (ATIVAN) tablet 1 mg  1 mg Oral PRN Briant Cedar, MD       And   ziprasidone (GEODON) injection 20 mg  20 mg Intramuscular PRN Briant Cedar, MD       magnesium hydroxide (MILK OF MAGNESIA) suspension 15 mL  15 mL Oral Daily PRN Briant Cedar, MD       [START ON 08/09/2021] OLANZapine zydis (ZYPREXA) disintegrating tablet 15 mg  15 mg Oral QHS Singleton, Amy E, MD       OLANZapine zydis (ZYPREXA) disintegrating tablet 5 mg  5 mg Oral QHS Singleton, Amy E, MD       temazepam (RESTORIL) capsule 15 mg  15 mg Oral QHS Singleton, Amy E, MD       valbenazine [facility]) capsule 40 mg  40 mg Oral Daily Briant Cedar, MD   40 mg at 08/08/21 4098   PTA Medications: Medications Prior to Admission  Medication Sig Dispense Refill Last Dose   hydrOXYzine (ATARAX) 25 MG tablet Take 1 tablet (25 mg total) by mouth 3 (three) times daily as needed for anxiety. (Patient not taking: Reported on 08/03/2021) 30 tablet 0    traZODone (DESYREL) 50 MG tablet Take 1 tablet (50 mg total) by mouth at bedtime as needed for sleep. (Patient not taking: Reported on 08/03/2021) 15 tablet 0     valbenazine (INGREZZA) 40 MG capsule Take 1 capsule (40 mg total) by mouth daily. (Patient not taking: Reported on 08/03/2021) 30 capsule 0     Patient Stressors: Financial difficulties   Health problems   Medication change or noncompliance    Patient Strengths: Supportive family/friends   Treatment Modalities: Medication Management, Group therapy, Case management,  1 to 1 session with clinician, Psychoeducation, Recreational therapy.   Physician Treatment Plan for Primary Diagnosis: Schizophrenia ([place]) Long Term Goal(s): Improvement in symptoms so as ready for discharge   Short Term Goals: Ability to identify changes in lifestyle to reduce recurrence of condition will improve Ability to maintain clinical measurements within normal limits will improve Compliance with prescribed medications will improve  Medication Management: Evaluate patient's response, side effects, and tolerance of medication regimen.  Therapeutic Interventions: 1 to 1 sessions, Unit Group sessions and Medication administration.  Evaluation of Outcomes: Not Met  Physician Treatment Plan for Secondary Diagnosis: Principal Problem:   Schizophrenia ([place])  Long Term Goal(s): Improvement in symptoms so as ready for discharge   Short Term Goals: Ability to identify changes in lifestyle to reduce recurrence of condition will improve Ability to maintain clinical measurements within normal limits will improve Compliance with prescribed medications will improve  Medication Management: Evaluate patient's response, side effects, and tolerance of medication regimen.  Therapeutic Interventions: 1 to 1 sessions, Unit Group sessions and Medication administration.  Evaluation of Outcomes: Not Met   RN Treatment Plan for Primary Diagnosis: Schizophrenia ([place]) Long Term Goal(s): Knowledge of disease and therapeutic regimen to maintain health will improve  Short Term Goals: Ability to remain free from injury will  improve, Ability to participate in decision making will improve, Ability to verbalize feelings will improve, Ability to disclose and discuss suicidal ideas, and Ability to identify and develop effective coping behaviors will improve  Medication Management: RN will administer medications as ordered by provider, will assess and evaluate patient's response and provide education to patient for prescribed medication. RN will report any adverse and/or side effects to prescribing provider.  Therapeutic Interventions: 1 on 1 counseling sessions, Psychoeducation, Medication administration, Evaluate responses to treatment, Monitor vital signs and CBGs as ordered, Perform/monitor CIWA, COWS, AIMS and Fall Risk screenings as ordered, Perform wound care treatments as ordered.  Evaluation of Outcomes: Not Met   LCSW Treatment Plan for Primary Diagnosis: Schizophrenia ([place]) Long Term Goal(s): Safe transition to appropriate next level of care at discharge, Engage patient in therapeutic group addressing interpersonal concerns.  Short Term Goals: Engage patient in aftercare planning with referrals and resources, Increase social support, Increase emotional regulation, Facilitate acceptance of mental health diagnosis and concerns, Identify triggers associated with mental health/substance abuse issues, and Increase skills for wellness and recovery  Therapeutic Interventions: Assess for all discharge needs, 1 to 1 time with Social worker, Explore available resources and support systems, Assess for adequacy in community support network, Educate family and significant other(s) on suicide prevention, Complete Psychosocial Assessment, Interpersonal group therapy.  Evaluation of Outcomes: Not Met   Progress in Treatment: Attending groups: Yes. Participating in groups: Yes. Taking medication as prescribed: Yes. Toleration medication: Yes. Family/Significant other contact made: Yes, individual(s) contacted:  Father   Patient understands diagnosis: No. Discussing patient identified problems/goals with staff: Yes. Medical problems stabilized or resolved: Yes. Denies suicidal/homicidal ideation: Yes. Issues/concerns per patient self-inventory: No.   New problem(s) identified: No, Describe:  None   New Short Term/Long Term Goal(s): medication stabilization, elimination of SI thoughts, development of comprehensive mental wellness plan.   Patient Goals: "To go home"   Discharge Plan or Barriers: Patient recently admitted. CSW will continue to follow and assess for appropriate referrals and possible discharge planning.   Reason for Continuation of Hospitalization: Delusions  Hallucinations Medication stabilization Suicidal ideation  Estimated Length of Stay: 3 to 5 days   Last 3 [place] Suicide Severity Risk Score: Flowsheet Row ED to Hosp-Admission (Current) from 08/04/2021 in [facility] ED from 08/03/2021 in [place] DEPT ED from 04/14/2021 in [place] No Risk No Risk No Risk       Last PHQ 2/9 Scores:    08/03/2021    3:05 PM 02/20/2021    4:52 PM 11/28/2020    3:55 PM  Depression screen PHQ 2/9  Decreased Interest 1 1 1   Down, Depressed, Hopeless 1 2 1   PHQ - 2 Score 2 3 2   Altered sleeping 0 2 1  Tired, decreased energy 0 1 1  Change in appetite 1 2 1   Feeling bad or failure about yourself  0 1 1  Trouble concentrating 1 1 1   Moving slowly or fidgety/restless 1 1 1   Suicidal thoughts 0 0 1  PHQ-9 Score 5 11 9   Difficult doing work/chores Somewhat difficult Very difficult Somewhat difficult    Scribe for Treatment Team: Carney Harder 08/08/2021 2:55 PM

## 2021-08-08 NOTE — Progress Notes (Signed)
   08/08/21 1945  Psych Admission Type (Psych Patients Only)  Admission Status Involuntary  Psychosocial Assessment  Patient Complaints Suspiciousness  Eye Contact Staring  Facial Expression Flat  Affect Flat  Speech Logical/coherent  Interaction Cautious  Motor Activity Tremors;Slow  Appearance/Hygiene Disheveled;Body odor  Behavior Characteristics Cooperative  Mood Suspicious;Preoccupied  Aggressive Behavior  Effect No apparent injury  Thought Process  Coherency WDL;Disorganized  Content Preoccupation  Delusions Paranoid  Perception Hallucinations  Hallucination Auditory  Judgment Limited  Confusion None  Danger to Self  Current suicidal ideation? Denies  Danger to Others  Danger to Others None reported or observed

## 2021-08-08 NOTE — Progress Notes (Signed)
The focus of this group is to help patients review their daily goal of treatment and discuss progress on daily workbooks.  Pt attended the evening group but responded minimally to discussion prompts from the Writer. Pt shared that today was an "okay" day on the unit, but would not elaborate as to why.  Pt did share that, upon discharge, he planned to stay well by "staying organized and eating right."  Pt rated his day a 7 out of 10 and he appeared guarded during interaction.

## 2021-08-08 NOTE — [facility] Suicide Risk Assessment (Signed)
CSW spoke to patient Futures trader at [place], Michaelle Copas.  CSW provided patient care manager update.  Angelique requested if she can come visit patient if still here on Friday.  CSW agreed to call on Friday to set up a meeting for next week if patient is still here.    Serenidy Waltz, LCSW, LCAS Clincal Social Worker  [facility]

## 2021-08-08 NOTE — Progress Notes (Signed)
   08/08/21 0515  Sleep  Number of Hours 1.25

## 2021-08-08 NOTE — Group Note (Signed)
Type of Therapy and Topic:  Group Therapy:  Stress Management   Participation Level:  Minimal    Description of Group:  Patients in this group were introduced to the idea of stress and encouraged to discuss negative and positive ways to manage stress. Patients discussed specific stressors that they have in their life right now and the physical signs and symptoms associated with that stress.  Patient encouraged to come up with positive changes to assist with the stress upon discharge in order to prevent future hospitalizations.   They also worked as a group on developing a specific plan for several patients to deal with stressors through boundary-setting, psychoeducation and self care techniques   Therapeutic Goals:               1)  To discuss the positive and negative impacts of stress             2)  identify signs and symptoms of stress             3)  generate ideas for stress management             4)  offer mutual support to others regarding stress management             5)  Developing plans for ways to manage specific stressors upon discharge               Summary of Patient Progress:  Patient participated minimally in group.  He came in after introductions and seemed to responding to internal stimuli.  Patient participated with worksheet and listened to feedback from other group members while discussing group topic.    Therapeutic Modalities:   Motivational Interviewing Brief Solution-Focused Therapy   Sitlali Koerner, LCSW, LCAS Clincal Social Worker  [facility]

## 2021-08-09 NOTE — Progress Notes (Signed)
   08/09/21 1100  Psych Admission Type (Psych Patients Only)  Admission Status Involuntary  Psychosocial Assessment  Patient Complaints Suspiciousness  Eye Contact Staring  Facial Expression Flat  Affect Preoccupied  Speech Logical/coherent  Interaction Cautious  Motor Activity Tremors;Slow  Appearance/Hygiene Disheveled  Behavior Characteristics Cooperative  Mood Preoccupied;Suspicious  Thought Process  Coherency Disorganized  Content Preoccupation  Delusions Paranoid  Perception Hallucinations  Hallucination Auditory  Judgment Limited  Confusion None  Danger to Self  Current suicidal ideation? Denies  Danger to Others  Danger to Others None reported or observed
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
## 2021-08-09 NOTE — Progress Notes (Signed)
   08/09/21 2015  Psych Admission Type (Psych Patients Only)  Admission Status Involuntary  Psychosocial Assessment  Patient Complaints Suspiciousness  Eye Contact Staring  Facial Expression Flat  Affect Flat  Speech Logical/coherent  Interaction Cautious  Motor Activity Tremors;Slow  Appearance/Hygiene Disheveled;Body odor  Aggressive Behavior  Effect No apparent injury  Thought Process  Coherency WDL;Disorganized  Content Preoccupation  Delusions Paranoid  Perception Hallucinations  Hallucination Auditory  Judgment Limited  Confusion None  Danger to Self  Current suicidal ideation? Denies  Danger to Others  Danger to Others None reported or observed

## 2021-08-09 NOTE — Progress Notes (Signed)
   08/09/21 0515  Sleep  Number of Hours 5.75

## 2021-08-09 NOTE — Group Note (Signed)
Recreation Therapy Group Note   Group Topic:Communication  Group Date: 08/09/2021 Start Time: 1000 End Time: 1040 Facilitators: Caroll Rancher, LRT,CTRS Location: [address] Dayroom   Goal Area(s) Addresses:  Patient will effectively listen to complete activity.  Patient will identify communication skills used to make activity successful.  Patient will identify how skills used during activity can be used to reach post d/c goals.    Group Description:  Geometric Drawings.  Three volunteers from the peer group will be shown an abstract picture with a particular arrangement of geometrical shapes.  Each round, one 'speaker' will describe the pattern, as accurately as possible without revealing the image to the group.  The remaining group members will listen and draw the picture to reflect how it is described to them. Patients with the role of 'listener' cannot ask clarifying questions but, may request that the speaker repeat a direction. Once the drawings are complete, the presenter will show the rest of the group the picture and compare how close each person came to drawing the picture. LRT will facilitate a post-activity discussion regarding effective communication and the importance of planning, listening, and asking for clarification in daily interactions with others.   Affect/Mood: Flat   Participation Level: Minimal   Participation Quality: Independent   Behavior: Appropriate   Speech/Thought Process: Rational   Insight: Fair   Judgement: Fair    Modes of Intervention: Activity   Patient Response to Interventions:  Attentive   Education Outcome:  Acknowledges education and In group clarification offered    Clinical Observations/Individualized Feedback: Pt was mostly quiet but attentive to group.  Pt shared that one important part of communication is that you "can learn from other people".  Pt attempted to complete the drawings.  Pt was called out of group to meet with the  doctor and did not return.     Plan: Continue to engage patient in RT group sessions 2-3x/week.   Caroll Rancher, Antonietta Jewel  08/09/2021 12:33 PM

## 2021-08-10 MED ORDER — TEMAZEPAM 15 MG PO CAPS: 15.0000 mg | ORAL_CAPSULE | Freq: Every evening | ORAL | Status: DC | PRN

## 2021-08-10 MED ORDER — TRAZODONE HCL 100 MG PO TABS
100.0000 mg | ORAL_TABLET | Freq: Every day | ORAL | Status: DC
  Administered 2021-08-10: 100 mg via ORAL
  Filled 2021-08-10 (×2): qty 1

## 2021-08-10 MED ORDER — OLANZAPINE 7.5 MG PO TABS
15.0000 mg | ORAL_TABLET | Freq: Every day | ORAL | Status: DC
  Administered 2021-08-10 – 2021-08-12 (×3): 15 mg via ORAL
  Filled 2021-08-10 (×4): qty 2

## 2021-08-10 NOTE — Group Note (Signed)
[facility] LCSW Group Therapy Note   Group Date: 08/10/2021 Start Time: 1300 End Time: 1400   Type of Therapy/Topic:  Group Therapy:  Emotion Regulation  Participation Level:  Active   Description of Group:    The purpose of this group is to assist patients in learning to regulate negative emotions and experience positive emotions. Patients will be guided to discuss ways in which they have been vulnerable to their negative emotions. These vulnerabilities will be juxtaposed with experiences of positive emotions or situations, and patients challenged to use positive emotions to combat negative ones. Special emphasis will be placed on coping with negative emotions in conflict situations, and patients will process healthy conflict resolution skills.  Therapeutic Goals: Patient will identify two positive emotions or experiences to reflect on in order to balance out negative emotions:  Patient will label two or more emotions that they find the most difficult to experience:  Patient will be able to demonstrate positive conflict resolution skills through discussion or role plays:   Summary of Patient Progress:   Patient appropriately participated in group.  He reports that he had been calm and at peace.  Patient shared that he works on positive thinking to assist in regulating emotions.  Patient also discussed his enjoyment of playing the piano and how that will distract him from strong emotions.  Patient participated in discussion and provided feedback to peers.     Therapeutic Modalities:   Cognitive Behavioral Therapy Feelings Identification Dialectical Behavioral Therapy   Marty Sadlowski E Kenner Lewan, LCSW

## 2021-08-10 NOTE — Progress Notes (Signed)
SPIRITUALITY GROUP NOTE  Spirituality group facilitated by Wilkie Aye, MDiv, BCC.  Group Description:  Group focused on topic of hope.  Patients participated in facilitated discussion around topic, connecting with one another around experiences and definitions for hope.  Group members engaged with visual explorer photos, reflecting on what hope looks like for them today.  Group engaged in discussion around how their definitions of hope are present today in hospital.   Modalities: Psycho-social ed, Adlerian, Narrative, MI Patient Progress: Mukund was present through 90% of group.  Engaged in group discussion when prompted.  Michaels responses were on topic.  He noted journaling and focusing his energy on thinkg he wants to change.
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
## 2021-08-10 NOTE — Progress Notes (Signed)
   08/10/21 1100  Psych Admission Type (Psych Patients Only)  Admission Status Involuntary  Psychosocial Assessment  Patient Complaints Suspiciousness  Eye Contact Staring  Facial Expression Flat  Affect Flat  Speech Logical/coherent  Interaction Cautious  Motor Activity Slow  Appearance/Hygiene Disheveled  Behavior Characteristics Cooperative  Mood Suspicious  Aggressive Behavior  Effect No apparent injury  Thought Process  Coherency Disorganized  Content Preoccupation  Delusions Paranoid  Perception Hallucinations  Hallucination Auditory  Judgment Limited  Confusion None  Danger to Self  Current suicidal ideation? Denies  Danger to Others  Danger to Others None reported or observed

## 2021-08-10 NOTE — Progress Notes (Signed)
Pt stated the medications are making him feel better, more like his old self, pt stated he feels better about the future    08/10/21 2045  Psych Admission Type (Psych Patients Only)  Admission Status Involuntary  Psychosocial Assessment  Patient Complaints Suspiciousness  Eye Contact Staring  Facial Expression Flat  Affect Flat  Speech Logical/coherent  Interaction Cautious  Motor Activity Tremors;Slow  Appearance/Hygiene Disheveled;Body odor  Behavior Characteristics Cooperative  Mood Preoccupied  Aggressive Behavior  Effect No apparent injury  Thought Process  Coherency WDL;Disorganized  Content Preoccupation  Delusions Paranoid  Perception Hallucinations  Hallucination Auditory  Judgment Limited  Confusion None  Danger to Self  Current suicidal ideation? Denies  Danger to Others  Danger to Others None reported or observed

## 2021-08-10 NOTE — Progress Notes (Signed)
   08/10/21 0500  Sleep  Number of Hours 5.75

## 2021-08-10 NOTE — Group Note (Signed)
Recreation Therapy Group Note   Group Topic:Team Building  Group Date: 08/10/2021 Start Time: 1002 End Time: 1033 Facilitators: Caroll Rancher, LRT,CTRS Location: [address] Dayroom   Goal Area(s) Addresses:  Patient will effectively work with peer towards shared goal.  Patient will identify skill used to make activity successful.  Patient will identify how skills used during activity can be used to reach post d/c goals.   Group Description:  Patient(s) were given a set of solo cups, a rubber band, and some tied strings. The objective is to build a pyramid with the cups by only using the rubber band and string to move the cups. After the activity the patient(s) and LRT debriefed and discussed what strategies worked, what didn't, and what lessons they can take from the activity and use in life post discharge.    Affect/Mood: Appropriate   Participation Level: Moderate   Participation Quality: Independent   Behavior: Appropriate   Speech/Thought Process: Rational   Insight: Limited   Judgement: Limited   Modes of Intervention: Team-building   Patient Response to Interventions:  Attentive   Education Outcome:  Acknowledges education and In group clarification offered    Clinical Observations/Individualized Feedback: Pt was cooperative with peers.  Pt followed along when peers gave instructions on how to move during activity.  Pt participated for a while before just observing.  Pt eventually left and did not return.    Plan: Continue to engage patient in RT group sessions 2-3x/week.   Caroll Rancher, LRT,CTRS 08/10/2021 12:42 PM

## 2021-08-11 MED ORDER — TEMAZEPAM 15 MG PO CAPS
15.0000 mg | ORAL_CAPSULE | Freq: Every day | ORAL | Status: DC
  Administered 2021-08-11 – 2021-08-12 (×2): 15 mg via ORAL
  Filled 2021-08-11 (×2): qty 1

## 2021-08-11 NOTE — [facility] Group Notes (Signed)
Goals Group 78/2023   Group Focus: affirmation, clarity of thought, and goals/reality orientation Treatment Modality:  Psychoeducation Interventions utilized were assignment, group exercise, and support Purpose: To be able to understand and verbalize the reason for their admission to the hospital. To understand that the medication helps with their chemical imbalance but they also need to work on their choices in life. To be challenged to develop a list of 30 positives about themselves. Also introduce the concept that "feelings" are not reality.  Participation Level:  Active  Participation Quality:  Appropriate  Affect:  Appropriate  Cognitive:  Appropriate  Insight:  Improving  Engagement in Group:  Engaged  Additional Comments:  Pt would not give an energy level. Did however write out a list, but would not share it with the group. Pt did say that "all of [place] need to take care of ourselves". He also sat by the door and opened it for the other patients.When this Clinical research associate thanked him, he smiled.  Dione Housekeeper

## 2021-08-11 NOTE — Progress Notes (Signed)
Patient ID: [name], male   DOB: August 27, 1980, 41 y.o.   MRN: [id number] D: Patient is quiet and isolative. He states his day was "just okay." He is minimal and forwards little information. He did attend group this evening. He took his nighttime medication with incident. Hopefully, patient will sleep better since his restoril has been reinstated.  A: Continue to monitor medication management and MD orders.  Safety checks completed every 15 minutes per protocol.  Offer support and encouragement as needed.  R: Patient remains quiet and isolative.

## 2021-08-11 NOTE — Progress Notes (Signed)
   08/11/21 1734  Psych Admission Type (Psych Patients Only)  Admission Status Involuntary  Psychosocial Assessment  Patient Complaints Suspiciousness  Eye Contact Fair  Facial Expression Flat  Affect Flat  Speech Logical/coherent  Interaction Minimal;Guarded  Motor Activity Slow  Appearance/Hygiene Disheveled  Behavior Characteristics Cooperative  Mood Euthymic  Aggressive Behavior  Targets Other (Comment) (no aggressive behavior noted)  Type of Behavior Other (Comment)  Effect No apparent injury  Thought Process  Coherency WDL  Content Paranoia  Delusions Paranoid  Perception Hallucinations  Hallucination Auditory;Visual (Writer witnessed Pt speaking with unseen others. Internal stimuli.)  Judgment Limited  Confusion None  Danger to Self  Current suicidal ideation? Denies  Danger to Others  Danger to Others None reported or observed

## 2021-08-11 NOTE — Progress Notes (Signed)
Patient standing in hallway near nursing station entertaining unseen others. Patient's lips are moving and he is responding to internal stimuli.

## 2021-08-11 NOTE — Progress Notes (Signed)
   08/11/21 0515  Sleep  Number of Hours 0
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
## 2021-08-11 NOTE — Progress Notes (Signed)
The focus of this group is to help patients review their daily goal of treatment and discuss progress on daily workbooks.  Pt attended the evening group but responded minimally to discussion prompts from the Writer. Pt shared that today was a generally "okay" day on the unit, but did not wish to elaborate as to why.  Pt told that, when feeling stressed out at home, he liked to get creative by drawing something as a way to relax. "It helps to distract me."  Pt rated his day a 7 out of 10 and his affect was appropriate.

## 2021-08-11 NOTE — Progress Notes (Signed)
   08/11/21 2000  Psych Admission Type (Psych Patients Only)  Admission Status Involuntary  Psychosocial Assessment  Patient Complaints Suspiciousness  Eye Contact Fair  Facial Expression Flat  Affect Flat  Speech Logical/coherent  Interaction Minimal;Guarded  Motor Activity Slow  Appearance/Hygiene Disheveled;Body odor  Behavior Characteristics Cooperative  Mood Preoccupied  Thought Process  Coherency WDL  Content Paranoia  Delusions Paranoid  Perception Hallucinations  Hallucination Auditory;Visual  Judgment Limited  Confusion None  Danger to Self  Current suicidal ideation? Denies  Danger to Others  Danger to Others None reported or observed

## 2021-08-11 NOTE — Group Note (Signed)
  [facility] LCSW Group Therapy Note  Date/Time:  08/11/2021 11:15am-12:00pm  Type of Therapy and Topic:  Group Therapy:  Self-Care after Hospitalization  Participation Level:  Minimal   Description of Group This process group involved patients discussing how they plan to take care of themselves in a better manner when they get home from the hospital.  The group started with patients listing one healthy and one unhealthy way they took care of themselves prior to hospitalization.  A discussion ensued about the differences in healthy and unhealthy coping skills.  Group members shared ideas about making changes when they return home so that they can stay well and in recovery.  The white board was used to list ideas so that patients can continue to see these ideas throughout the day.  Therapeutic Goals Patient will identify and describe one healthy and one unhealthy coping technique used prior to hospitalization Patient will participate in generating ideas about healthy self-care options when they return to the community Patients will be supportive of one another and receive said support from others Patient will identify one healthy self-care activity to add to his/her post-hospitalization life that can help in recovery  Summary of Patient Progress:  The patient expressed that one thing he looks forward to doing at discharge is working, saying "there is nothing else to do in life."  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  He said he will be living with father at d/c and will "do what I have to do" in order to take care of himself after discharge.  He would not respond to questions about whether he plans to stay on his medicine and go to his doctor's appointments.  Patient's participation in group was minimal, only talking when called on directly.   Therapeutic Modalities Brief Solution-Focused Therapy Motivational  Interviewing Psychoeducation   Ambrose Mantle, LCSW 08/11/2021, 12:00pm

## 2021-08-11 NOTE — Plan of Care (Signed)
Collateral with [name] 2:50-3:05pm:   Discussed with dad [name]'s current course and our attempts to target [name]'s sleep. Discussed that [name] will likely get Restoril on discharge. Dad reports he administers all of [name]'s medications and keeps them in a safe.

## 2021-08-12 MED ORDER — MIRTAZAPINE 7.5 MG PO TABS
7.5000 mg | ORAL_TABLET | Freq: Every day | ORAL | Status: DC
  Administered 2021-08-12: 7.5 mg via ORAL
  Filled 2021-08-12 (×3): qty 1

## 2021-08-12 NOTE — [facility] Group Notes (Signed)
Adult Psychoeducational Group Note Date:  08/12/2021 Time:  0900-1045 Group Topic/Focus: PROGRESSIVE RELAXATION. A group where deep breathing is taught and tensing and relaxation muscle groups is used. Imagery is used as well.  Pts are asked to imagine 3 pillars that hold them up when they are not able to hold themselves up and to share that with the group.  Participation Level:  Active  Participation Quality:  Appropriate  Affect:  Appropriate  Cognitive:  Oriented  Insight: Improving  Engagement in Group:  Engaged  Modes of Intervention:  Activity, Discussion, Education, and Support  Additional Comments:  Pt attended the group. Rates his energy at an 8/10. Pt states what holds him up and gives him support is' "being real with myself"  Dione Housekeeper

## 2021-08-12 NOTE — Progress Notes (Signed)
   08/12/21 0900  Psych Admission Type (Psych Patients Only)  Admission Status Involuntary  Psychosocial Assessment  Patient Complaints Suspiciousness  Eye Contact Fair  Facial Expression Flat  Affect Flat  Speech Logical/coherent  Interaction Minimal;Guarded  Motor Activity Slow  Appearance/Hygiene Disheveled  Behavior Characteristics Cooperative  Mood Preoccupied  Aggressive Behavior  Effect No apparent injury  Thought Process  Coherency WDL  Content Paranoia  Delusions Paranoid  Perception Hallucinations  Hallucination Auditory;Visual  Judgment Limited  Confusion None  Danger to Self  Current suicidal ideation? Denies  Danger to Others  Danger to Others None reported or observed

## 2021-08-12 NOTE — Group Note (Signed)
[facility] LCSW Group Therapy Note  Date/Time:  08/12/2021 10:00am-11:00am  Type of Therapy and Topic:  Group Therapy:  Healthy and Unhealthy Supports  Participation Level:  Did Not Attend   Description of Group:  Patients in this group were introduced to the idea of adding a variety of healthy supports to address the various needs in their lives, especially in reference to their plans and focus for the new year.  Patients discussed what additional healthy supports could be helpful in their recovery and wellness after discharge in order to prevent future hospitalizations such as counselor, doctor, other levels of psychiatric care such as ACTT services, therapy groups, 12-step groups, and problem-specific support groups.  A demonstration was given about how to set boundaries which patients expressed was beneficial.  Several songs were played to inspire patients to be more self-supportive.  Therapeutic Goals:   1)  discuss importance of adding supports to stay well once out of the hospital  2)  compare healthy versus unhealthy supports and identify some examples of each  3)  generate ideas and descriptions of healthy supports that can be added  4)  offer mutual support about how to address unhealthy supports  5)  encourage active participation in and adherence to discharge plan    Summary of Patient Progress:  The patient was invited to group, did not attend.   Therapeutic Modalities:   Motivational Interviewing Brief Solution-Focused Therapy  [place] Doig Grossman-Orr, LCSW       

## 2021-08-12 NOTE — Progress Notes (Signed)
   08/12/21 0531  Sleep  Number of Hours 4.25

## 2021-08-12 NOTE — Progress Notes (Signed)
Pt currently lying in bed with eyes closed, respirations even/unlabored, no s/s of distress (a) 15 min checks (r) safety maintained.

## 2021-08-12 NOTE — Progress Notes (Signed)
Adult Psychoeducational Group Note  Date:  08/12/2021 Time:  9:08 PM  Group Topic/Focus:  Wrap-Up Group:   The focus of this group is to help patients review their daily goal of treatment and discuss progress on daily workbooks.  Participation Level:  Active  Participation Quality:  Appropriate  Affect:  Appropriate  Cognitive:  Appropriate  Insight: Appropriate  Engagement in Group:  Developing/Improving  Modes of Intervention:  Discussion  Additional Comments:  Pt stated his goal for today was to focus on his treatment plan. Pt stated he accomplished his goal today. Pt stated he talked with his doctor and social worker about his care today. Pt rated his overall day a 10. Pt stated he feels a lot better, almost back to normal. Pt agrees the medication is working for him. Pt stated his father coming for visitation tonight improved his overall day. Pt stated he felt better about himself today. Pt stated he was able to attend all meals. Pt stated he took all medications provided today. Pt stated he attend all groups held today. Pt stated his appetite was pretty good today. Pt rated sleep last night was pretty good. Pt stated the goal tonight was to get some rest. Pt stated he had no physical pain tonight. Pt deny visual hallucinations and auditory issues tonight. Pt denies thoughts of harming himself or others. Pt stated he would alert staff if anything changed  Felipa Furnace 08/12/2021, 9:08 PM
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
## 2021-08-13 ENCOUNTER — Encounter (HOSPITAL_COMMUNITY): Payer: Self-pay

## 2021-08-13 MED ORDER — MIRTAZAPINE 7.5 MG PO TABS
7.5000 mg | ORAL_TABLET | Freq: Every day | ORAL | Status: DC
  Filled 2021-08-13 (×2): qty 1

## 2021-08-13 MED ORDER — TEMAZEPAM 15 MG PO CAPS: 15.0000 mg | ORAL_CAPSULE | Freq: Every evening | ORAL | Status: DC | PRN

## 2021-08-13 MED ORDER — TEMAZEPAM 15 MG PO CAPS
15.0000 mg | ORAL_CAPSULE | Freq: Every day | ORAL | Status: DC
  Administered 2021-08-13 – 2021-08-14 (×2): 15 mg via ORAL
  Filled 2021-08-13 (×2): qty 1

## 2021-08-13 MED ORDER — OLANZAPINE 15 MG PO TBDP
15.0000 mg | ORAL_TABLET | Freq: Every day | ORAL | Status: DC
Start: 2021-08-13 — End: 2021-08-15
  Administered 2021-08-13 – 2021-08-14 (×2): 15 mg via ORAL
  Filled 2021-08-13 (×5): qty 1

## 2021-08-13 MED ORDER — TEMAZEPAM 15 MG PO CAPS: 30.0000 mg | ORAL_CAPSULE | Freq: Every day | ORAL | Status: DC

## 2021-08-13 MED ORDER — MIRTAZAPINE 7.5 MG PO TABS
7.5000 mg | ORAL_TABLET | Freq: Every day | ORAL | Status: DC
  Administered 2021-08-13 – 2021-08-14 (×2): 7.5 mg via ORAL
  Filled 2021-08-13 (×4): qty 1

## 2021-08-13 NOTE — Progress Notes (Signed)
    08/13/21 2045  Psych Admission Type (Psych Patients Only)  Admission Status Involuntary  Psychosocial Assessment  Patient Complaints Insomnia  Eye Contact Fair  Facial Expression Flat  Affect Flat  Speech Logical/coherent  Interaction Assertive  Motor Activity Pacing  Appearance/Hygiene Disheveled;Poor hygiene  Behavior Characteristics Cooperative  Mood Preoccupied;Pleasant  Thought Process  Coherency WDL  Content Paranoia  Delusions Paranoid  Perception WDL  Hallucination None reported or observed  Judgment Limited  Confusion None  Danger to Self  Current suicidal ideation? Denies  Danger to Others  Danger to Others None reported or observed

## 2021-08-13 NOTE — Progress Notes (Signed)
   08/12/21 2120  Psych Admission Type (Psych Patients Only)  Admission Status Involuntary  Psychosocial Assessment  Patient Complaints Suspiciousness;Insomnia  Eye Contact Fair  Facial Expression Flat  Affect Flat  Speech Logical/coherent  Interaction Assertive  Motor Activity Other (Comment) (Unremarkable)  Appearance/Hygiene Disheveled  Behavior Characteristics Cooperative  Mood Preoccupied;Pleasant  Thought Process  Coherency WDL  Content Paranoia  Delusions Paranoid  Perception WDL  Hallucination None reported or observed  Judgment Limited  Confusion None  Danger to Self  Current suicidal ideation? Denies  Danger to Others  Danger to Others None reported or observed

## 2021-08-13 NOTE — Group Note (Signed)
Recreation Therapy Group Note   Group Topic:Coping Skills  Group Date: 08/13/2021 Start Time: 0955 End Time: 1030 Facilitators: Caroll Rancher, LRT,CTRS Location: [address] Dayroom   Goal Area(s) Addresses:  Patient will identify positive coping skills. Patient will identify benefits of using coping skills post d/c.  Group Description:  Mind Map.  Patient was provided a blank template of a diagram with 32 blank boxes in a tiered system, branching from the center (similar to a bubble chart). LRT directed patients to label the middle of the diagram "Coping Skills".  LRT and patients then identified 8 different challenges (anxiety, depression, setting boundaries, relationships, attitude, self esteem, self respect and communication) in which coping skills could be used.  Patients were then given 20 minutes the come up with 3 coping skills for each challenge identified.  LRT would then write the coping skills the patients came up with on the board so patients could fill in any blank spots they may have had on their sheets.   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  During activity, pt was engaged and interacted well with peers.  Pt was able to identify some other coping skills such as breathing, thinking positive, encouragement, see other perspectives, build your skill set, enjoy life, encourage self and better thoughts.     Plan: Continue to engage patient in RT group sessions 2-3x/week.   Caroll Rancher, Antonietta Jewel 08/13/2021 12:36 PM

## 2021-08-13 NOTE — Progress Notes (Signed)
Adult Psychoeducational Group Note  Date:  08/13/2021 Time:  8:47 PM  Group Topic/Focus:  Wrap-Up Group:   The focus of this group is to help patients review their daily goal of treatment and discuss progress on daily workbooks.  Participation Level:  Active  Participation Quality:  Appropriate  Affect:  Appropriate  Cognitive:  Appropriate  Insight: Appropriate  Engagement in Group:  Developing/Improving  Modes of Intervention:  Discussion  Additional Comments:  Pt stated his goal for today was to focus on his treatment plan. Pt stated he accomplished his goal today. Pt stated he talked with his doctor and social worker about his care today. Pt rated his overall day a 8 out of 10. Pt stated he feels a lot better, almost back to normal. Pt agrees the medication is working for him. Pt stated the goal is gain employment after discharge and stay employed more than a year. Pt stated his father coming for visitation tonight improved his overall day. Pt stated he felt better about himself today. Pt stated he was able to attend all meals. Pt stated he took all medications provided today. Pt stated he attend all groups held today. Pt stated his appetite was pretty good today. Pt rated sleep last night was pretty good. Pt stated the goal tonight was to get some rest. Pt stated he had no physical pain tonight. Pt deny visual hallucinations and auditory issues tonight. Pt denies thoughts of harming himself or others. Pt stated he would alert staff if anything changed  Felipa Furnace 08/13/2021, 8:47 PM

## 2021-08-13 NOTE — Group Note (Signed)
LCSW Group Therapy Note   Group Date: 08/13/2021 Start Time: 1300 End Time: 1400   Type of Therapy and Topic:  Group Therapy: Boundaries  Participation Level:  Active  Description of Group: This group will address the use of boundaries in their personal lives. Patients will explore why boundaries are important, the difference between healthy and unhealthy boundaries, and negative and postive outcomes of different boundaries and will look at how boundaries can be crossed.  Patients will be encouraged to identify current boundaries in their own lives and identify what kind of boundary is being set. Facilitators will guide patients in utilizing problem-solving interventions to address and correct types boundaries being used and to address when no boundary is being used. Understanding and applying boundaries will be explored and addressed for obtaining and maintaining a balanced life. Patients will be encouraged to explore ways to assertively make their boundaries and needs known to significant others in their lives, using other group members and facilitator for role play, support, and feedback.  Therapeutic Goals:  1.  Patient will identify areas in their life where setting clear boundaries could be  used to improve their life.  2.  Patient will identify signs/triggers that a boundary is not being respected. 3.  Patient will identify two ways to set boundaries in order to achieve balance in  their lives: 4.  Patient will demonstrate ability to communicate their needs and set boundaries  through discussion and/or role plays  Summary of Patient Progress:  [name] was present/active throughout the session and proved open to feedback from CSW and peers. Patient demonstrated good insight into the subject matter, was respectful of peers, and was present throughout the entire session.  Therapeutic Modalities:   Cognitive Behavioral Therapy Solution-Focused Therapy  Beatris Si,  LCSW 08/13/2021  2:27 PM

## 2021-08-13 NOTE — BH IP Treatment Plan (Signed)
Interdisciplinary Treatment and Diagnostic Plan Update  08/13/2021 Time of Session: update [name] MRN: [id number]  Principal Diagnosis: Schizophrenia [facility])  Secondary Diagnoses: Principal Problem:   Schizophrenia (HCC)   Current Medications:  Current Facility-Administered Medications  Medication Dose Route Frequency Provider Last Rate Last Admin   acetaminophen (TYLENOL) tablet 650 mg  650 mg Oral Q6H PRN Lauro Franklin, MD       alum & mag hydroxide-simeth (MAALOX/MYLANTA) 200-200-20 MG/5ML suspension 30 mL  30 mL Oral Q6H PRN Lauro Franklin, MD       hydrOXYzine (ATARAX) tablet 25 mg  25 mg Oral TID PRN Lauro Franklin, MD   25 mg at 08/10/21 2045   OLANZapine zydis (ZYPREXA) disintegrating tablet 10 mg  10 mg Oral Q8H PRN Lauro Franklin, MD       And   LORazepam (ATIVAN) tablet 1 mg  1 mg Oral PRN Lauro Franklin, MD       And   ziprasidone (GEODON) injection 20 mg  20 mg Intramuscular PRN Pashayan, Mardelle Matte, MD       magnesium hydroxide (MILK OF MAGNESIA) suspension 15 mL  15 mL Oral Daily PRN Lauro Franklin, MD       mirtazapine (REMERON) tablet 7.5 mg  7.5 mg Oral QHS Mason Jim, Amy E, MD       OLANZapine zydis (ZYPREXA) disintegrating tablet 15 mg  15 mg Oral QHS Singleton, Amy E, MD       temazepam (RESTORIL) capsule 30 mg  30 mg Oral QHS Singleton, Amy E, MD       valbenazine [facility]) capsule 40 mg  40 mg Oral Daily Lauro Franklin, MD   40 mg at 08/13/21 2542   PTA Medications: Medications Prior to Admission  Medication Sig Dispense Refill Last Dose   hydrOXYzine (ATARAX) 25 MG tablet Take 1 tablet (25 mg total) by mouth 3 (three) times daily as needed for anxiety. (Patient not taking: Reported on 08/03/2021) 30 tablet 0    traZODone (DESYREL) 50 MG tablet Take 1 tablet (50 mg total) by mouth at bedtime as needed for sleep. (Patient not taking: Reported on 08/03/2021) 15 tablet 0    valbenazine (INGREZZA) 40 MG  capsule Take 1 capsule (40 mg total) by mouth daily. (Patient not taking: Reported on 08/03/2021) 30 capsule 0     Patient Stressors: Financial difficulties   Health problems   Medication change or noncompliance    Patient Strengths: Supportive family/friends   Treatment Modalities: Medication Management, Group therapy, Case management,  1 to 1 session with clinician, Psychoeducation, Recreational therapy.   Physician Treatment Plan for Primary Diagnosis: Schizophrenia (HCC) Long Term Goal(s): Improvement in symptoms so as ready for discharge   Short Term Goals: Ability to identify changes in lifestyle to reduce recurrence of condition will improve Ability to verbalize feelings will improve Ability to demonstrate self-control will improve Compliance with prescribed medications will improve  Medication Management: Evaluate patient's response, side effects, and tolerance of medication regimen.  Therapeutic Interventions: 1 to 1 sessions, Unit Group sessions and Medication administration.  Evaluation of Outcomes: Progressing  Physician Treatment Plan for Secondary Diagnosis: Principal Problem:   Schizophrenia (HCC)  Long Term Goal(s): Improvement in symptoms so as ready for discharge   Short Term Goals: Ability to identify changes in lifestyle to reduce recurrence of condition will improve Ability to verbalize feelings will improve Ability to demonstrate self-control will improve Compliance with prescribed medications will improve  Medication Management: Evaluate patient's response, side effects, and tolerance of medication regimen.  Therapeutic Interventions: 1 to 1 sessions, Unit Group sessions and Medication administration.  Evaluation of Outcomes: Progressing   RN Treatment Plan for Primary Diagnosis: Schizophrenia (HCC) Long Term Goal(s): Knowledge of disease and therapeutic regimen to maintain health will improve  Short Term Goals: Ability to remain free from  injury will improve, Ability to verbalize frustration and anger appropriately will improve, Ability to demonstrate self-control, Ability to participate in decision making will improve, Ability to verbalize feelings will improve, Ability to disclose and discuss suicidal ideas, Ability to identify and develop effective coping behaviors will improve, and Compliance with prescribed medications will improve  Medication Management: RN will administer medications as ordered by provider, will assess and evaluate patient's response and provide education to patient for prescribed medication. RN will report any adverse and/or side effects to prescribing provider.  Therapeutic Interventions: 1 on 1 counseling sessions, Psychoeducation, Medication administration, Evaluate responses to treatment, Monitor vital signs and CBGs as ordered, Perform/monitor CIWA, COWS, AIMS and Fall Risk screenings as ordered, Perform wound care treatments as ordered.  Evaluation of Outcomes: Progressing   LCSW Treatment Plan for Primary Diagnosis: Schizophrenia (HCC) Long Term Goal(s): Safe transition to appropriate next level of care at discharge, Engage patient in therapeutic group addressing interpersonal concerns.  Short Term Goals: Engage patient in aftercare planning with referrals and resources, Increase social support, Increase ability to appropriately verbalize feelings, Increase emotional regulation, Facilitate acceptance of mental health diagnosis and concerns, Facilitate patient progression through stages of change regarding substance use diagnoses and concerns, Identify triggers associated with mental health/substance abuse issues, and Increase skills for wellness and recovery  Therapeutic Interventions: Assess for all discharge needs, 1 to 1 time with Social worker, Explore available resources and support systems, Assess for adequacy in community support network, Educate family and significant other(s) on suicide  prevention, Complete Psychosocial Assessment, Interpersonal group therapy.  Evaluation of Outcomes: Progressing   Progress in Treatment: Attending groups: Yes. Participating in groups: Yes. Taking medication as prescribed: Yes. Patient has been observed cheeking but nursing has been watching patient to ensure taking medications.  Toleration medication: Yes. Family/Significant other contact made: Yes, individual(s) contacted:  Father Patient understands diagnosis: No. Discussing patient identified problems/goals with staff: Yes. Medical problems stabilized or resolved: Yes. Denies suicidal/homicidal ideation: Yes. Issues/concerns per patient self-inventory: No.  New problem(s) identified: No, Describe:  none reported   New Short Term/Long Term Goal(s):  medication stabilization, elimination of SI thoughts, development of comprehensive mental wellness plan.    Patient Goals:  Patient continues to work on "to go home"   Discharge Plan or Barriers: Working on getting patient to sleep more at night. Patient has no psychosocial barriers to discharge.  Patient set up with appts. Including [employer] and [employer].   Reason for Continuation of Hospitalization: Anxiety Delusions  Hallucinations Medication stabilization  Estimated Length of Stay: 3-5 days  Last 3 [place] Suicide Severity Risk Score: Flowsheet Row ED to Hosp-Admission (Current) [facility] from 08/03/2021 in [facility] from 04/14/2021 in [facility] EM[facility]GENC[facility] DEPART[facility]  C-SSRS RISK CATEGORY No Risk No Risk No Risk       Last PHQ 2/9 Scores:    08/03/2021    3:05 PM 02/20/2021    4:52 PM 11/28/2020    3:55 PM  Depression screen PHQ 2/9  Decreased Interest 1 1 1  Down, Depressed, Hopeless 1 2 1   PHQ - 2 Score 2 3 2   Altered sleeping 0 2 1  Tired, decreased energy 0 1 1  Change in appetite 1  2 1   Feeling bad or failure about yourself  0 1 1  Trouble concentrating 1 1 1   Moving slowly or fidgety/restless 1 1 1   Suicidal thoughts 0 0 1  PHQ-9 Score 5 11 9   Difficult doing work/chores Somewhat difficult Very difficult Somewhat difficult    Scribe for Treatment Team: , LCSW 08/13/2021 10:28 AM
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
## 2021-08-13 NOTE — Progress Notes (Signed)
[name] was up and visible on the unit.  He did attend evening wrap up group.  He stated he had a great visit with his father.  He expressed hoping to be discharged soon "because I need to get a job so I can keep my mind sharp and stay positive."  He denied SI/HI or AVH.  He had difficulty falling asleep this evening.  This writer went to offer repeat dosage of Restoril at 130am  but pt was finally asleep.  Q 15 minute checks maintained for safety.  He remains safe on the unit.   08/12/21 2120  Psych Admission Type (Psych Patients Only)  Admission Status Involuntary  Psychosocial Assessment  Patient Complaints Suspiciousness;Insomnia  Eye Contact Fair  Facial Expression Flat  Affect Flat  Speech Logical/coherent  Interaction Assertive  Motor Activity Other (Comment) (Unremarkable)  Appearance/Hygiene Disheveled  Behavior Characteristics Cooperative  Mood Preoccupied;Pleasant  Thought Process  Coherency WDL  Content Paranoia  Delusions Paranoid  Perception WDL  Hallucination None reported or observed  Judgment Limited  Confusion None  Danger to Self  Current suicidal ideation? Denies  Danger to Others  Danger to Others None reported or observed

## 2021-08-14 NOTE — Group Note (Signed)
Recreation Therapy Group Note   Group Topic:Self-Esteem  Group Date: 08/14/2021 Start Time: 1000 End Time: 1030 Facilitators: Caroll Rancher, LRT,CTRS Location: [address] Dayroom   Goal Area(s) Addresses:  Patient will be able to identify self esteem. Patient will successfully share why it is beneficial to positive self esteem.  Group Description:  Patients and LRT discussed the importance of self esteem and what influences how we see ourselves.  Patients then picked a picture of a blank face of their choosing.  Patients were instructed to draw and describe how they see themselves on the blank face.  On the back of the sheet, patients were to identify how other people perceive them.  Patients were given colored pencils, markers, and crayons to complete the assignment. Patients shared their completed assignment with each other. Writer and group reflected on how it's important to have positive thoughts about oneself because it can impact how you move in the world.     Affect/Mood: N/A   Participation Level: Did not attend    Clinical Observations/Individualized Feedback:     Plan: Continue to engage patient in RT group sessions 2-3x/week.   Caroll Rancher, Antonietta Jewel 08/14/2021 12:23 PM
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
## 2021-08-14 NOTE — Progress Notes (Signed)
   08/14/21 2005  Psych Admission Type (Psych Patients Only)  Admission Status Involuntary  Psychosocial Assessment  Patient Complaints None  Eye Contact Fair  Facial Expression Flat  Affect Flat  Speech Soft  Interaction Assertive  Motor Activity Other (Comment) (wnl)  Appearance/Hygiene Unremarkable  Behavior Characteristics Cooperative  Mood Preoccupied  Thought Process  Coherency Circumstantial  Content WDL  Delusions None reported or observed  Perception WDL  Hallucination None reported or observed  Judgment Limited  Danger to Self  Current suicidal ideation? Denies  Danger to Others  Danger to Others None reported or observed   Progress note   D: Pt seen in his room. Pt denies SI, HI, AVH. Pt rates pain  0/10. Pt rates anxiety  0/10 and depression  0/10. Pt was sitting on bed and promptly jumps up when this writer comes into room. Pt has good eye contact. Says that the best thing about his day today was that he ate food and exercised. Pt informed that he needs to get a good night's sleep. Pt encouraged to let this writer know if he is having trouble sleeping as more sleep medication is available. Pt verbalized understanding. Pt seen in dayroom. Per day shift, pt was assisted with a shower today.Pt denies any other complaints at this time.  A: Pt provided support and encouragement. Pt given scheduled medication as prescribed. PRNs as appropriate. No evidence of cheeking noted. Q15 min checks for safety.   R: Pt safe on the unit. Will continue to monitor.

## 2021-08-14 NOTE — Progress Notes (Signed)
[facility] LCSW Progress Note   08/14/2021    2:26 PM  MAJID [id number]   [id number]   Type of Contact and Topic:  Care Coordination   CSW observed meeting with patient and Angelique ([email or web address]), case manager with [employer]. During meeting case worker provided patient with recreational activity books.   Patient provided consent for [facility] to provide them with health information. Patient provided [facility] written consent to reach out to patient's father. Case manager requests to be provided with discharge summary.   Signed:  Corky Crafts, MSW, LCSWA, LCAS 08/14/2021 2:26 PM

## 2021-08-14 NOTE — Progress Notes (Signed)
   08/14/21 1000  Psych Admission Type (Psych Patients Only)  Admission Status Involuntary  Psychosocial Assessment  Patient Complaints Insomnia;Suspiciousness  Eye Contact Fair  Facial Expression Flat  Affect Flat  Speech Soft  Interaction Minimal  Motor Activity Pacing  Appearance/Hygiene Disheveled  Behavior Characteristics Appropriate to situation  Mood Preoccupied  Thought Process  Coherency Circumstantial  Content Paranoia  Delusions Paranoid  Perception WDL  Hallucination None reported or observed  Judgment Limited  Confusion None  Danger to Self  Current suicidal ideation? Denies  Danger to Others  Danger to Others None reported or observed
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
## 2021-08-14 NOTE — BHH Counselor (Signed)
CSW spoke with patient father who is agreeable to pick up patient at 11am.  Father had some questions about patient medications and discussed preference on patient staying on disintegrating medication to ensure patient takes medications.  Father also discussed possibly getting guardianship and CSW provided education about guardianship and power of attorney, differences and steps to take if father wanted to pursue guardianship.  Father was appreciative of education.    Azlin Zilberman, LCSW, LCAS Clincal Social Worker  [facility]

## 2021-08-15 MED ORDER — HYDROXYZINE HCL 25 MG PO TABS: 25.0000 mg | ORAL_TABLET | Freq: Three times a day (TID) | ORAL | 0 refills | Status: DC | PRN

## 2021-08-15 MED ORDER — TEMAZEPAM 15 MG PO CAPS: 15.0000 mg | ORAL_CAPSULE | Freq: Every day | ORAL | 0 refills | Status: DC

## 2021-08-15 MED ORDER — TEMAZEPAM 15 MG PO CAPS
15.0000 mg | ORAL_CAPSULE | Freq: Every day | ORAL | 0 refills | Status: DC
Start: 2021-08-15 — End: 2022-09-30

## 2021-08-15 MED ORDER — MIRTAZAPINE 7.5 MG PO TABS: 7.5000 mg | ORAL_TABLET | Freq: Every day | ORAL | 0 refills | Status: DC

## 2021-08-15 MED ORDER — OLANZAPINE 15 MG PO TBDP
15.0000 mg | ORAL_TABLET | Freq: Every day | ORAL | 0 refills | Status: DC
Start: 2021-08-15 — End: 2021-08-31

## 2021-08-15 MED ORDER — VALBENAZINE TOSYLATE 40 MG PO CAPS: 40.0000 mg | ORAL_CAPSULE | Freq: Every day | ORAL | 0 refills | Status: DC

## 2021-08-15 NOTE — Progress Notes (Signed)
Adult Psychoeducational Group Note  Date:  08/15/2021 Time:  12:22 AM  Group Topic/Focus:  Wrap-Up Group:   The focus of this group is to help patients review their daily goal of treatment and discuss progress on daily workbooks.  Participation Level:  Active  Participation Quality:  Appropriate  Affect:  Appropriate  Cognitive:  Appropriate  Insight: Appropriate  Engagement in Group:  Developing/Improving  Modes of Intervention:  Discussion  Additional Comments:  Pt stated his goal for today was to focus on his treatment plan. Pt stated he accomplished his goal today. Pt stated he talked with his doctor and social worker about his care today. Pt rated his overall day a 8 out of 10. Pt stated he made no calls today. Pt stated he felt better about himself today. Pt stated he was able to attend all meals. Pt stated he took all medications provided today. Pt stated he attend all groups held today. Pt stated his appetite was pretty good today. Pt rated sleep last night was pretty good. Pt stated the goal tonight was to get some rest. Pt stated he had no physical pain tonight. Pt deny visual hallucinations and auditory issues tonight. Pt denies thoughts of harming himself or others. Pt stated he would alert staff if anything changed  Felipa Furnace 08/15/2021, 12:22 AM

## 2021-08-15 NOTE — Progress Notes (Signed)
  [facility] Adult Case Management Discharge Plan :  Will you be returning to the same living situation after discharge:  Yes,  he will return to his father's home At discharge, do you have transportation home?: Yes,  father will provide transportation Do you have the ability to pay for your medications: Yes,  insurance (medicaid)  Release of information consent forms completed and in the chart;  Patient's signature needed at discharge.  Patient to Follow up at:  Follow-up Information     [facility] Follow up on 08/14/2021.   Why: You have a hospital discharge appointment for therapy and medication management services on 08/20/21 at  5 pm.  This will be a Virtual telehealth appointment. Contact information: [address]  [address] [place] [address] [phone]         [facility]. Go to.   Why: You have an assessment appointment for supported employment program on 08/21/2021 at 1:45pm.  Please go to this location to get connected to services. Contact information: [address] [place], [place] [address] INTAKE: [phone] Ext [phone]  ([phone]        Karle Plumber, MD. Call.   Specialty: Internal Medicine Why: Go to your primary care physician for help with sleep medications. Contact information: [address] [place] [address] [phone]                 Next level of care provider has access to [facility] Link:no  Safety Planning and Suicide Prevention discussed: Yes,  Laurann Montana on 08/08/21      Has patient been referred to the Quitline?: Patient refused referral Patient reports that he quit smoking cigarettes "a while ago"  Patient has been referred for addiction treatment: N/A  Marinda Elk, LCSW 08/15/2021, 9:56 AM

## 2021-08-15 NOTE — Progress Notes (Signed)
   08/15/21 0559  Sleep  Number of Hours 5.75

## 2021-08-15 NOTE — Progress Notes (Signed)
D: Pt A & O X 3. Denies SI, HI, AVH and pain at this time. D/C home as ordered. Picked up in lobby by his father. A: D/C instructions reviewed with pt and his father including prescriptions, medication samples and follow up appointment, compliance encouraged. All belongings from locker 12 returned to pt at time of departure. Scheduled medications given with verbal education and effects monitored. Safety checks maintained without incident till time of d/c.  R: Pt receptive to care. Compliant with medications when offered. Denies adverse drug reactions when assessed. Verbalized understanding related to d/c instructions. Signed belonging sheet in agreement with items received from locker. Ambulatory with a steady gait. Appears to be in no physical distress at time of departure.

## 2021-08-15 NOTE — BHH Group Notes (Signed)
Adult Psychoeducational Group Note  Date:  08/15/2021 Time:  9:06 AM  Group Topic/Focus:  Goals Group:   The focus of this group is to help patients establish daily goals to achieve during treatment and discuss how the patient can incorporate goal setting into their daily lives to aide in recovery.  Participation Level:  Active  Participation Quality:  Appropriate  Affect:  Appropriate  Cognitive:  Appropriate  Insight: Appropriate  Engagement in Group:  Engaged  Modes of Intervention:  Discussion  Additional Comments:    Donell Beers 08/15/2021, 9:06 AM

## 2021-08-26 ENCOUNTER — Ambulatory Visit (HOSPITAL_COMMUNITY)
Admission: EM | Admit: 2021-08-26 | Discharge: 2021-08-26 | Disposition: A | Discharge status: Home / Self Care | Payer: Medicaid Other | Attending: Family | Admitting: Family

## 2021-08-26 DIAGNOSIS — Z91148 Patient's other noncompliance with medication regimen for other reason: Secondary | ICD-10-CM | POA: Insufficient documentation

## 2021-08-26 DIAGNOSIS — R4689 Other symptoms and signs involving appearance and behavior: Secondary | ICD-10-CM

## 2021-08-26 DIAGNOSIS — F200 Paranoid schizophrenia: Secondary | ICD-10-CM | POA: Insufficient documentation

## 2021-08-26 MED ORDER — LORAZEPAM 1 MG PO TABS
1.0000 mg | ORAL_TABLET | Freq: Once | ORAL | Status: AC
  Administered 2021-08-26: 1 mg via ORAL
  Filled 2021-08-26: qty 1
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
## 2021-08-26 NOTE — Discharge Instructions (Signed)
Take all medications as prescribed. Keep all follow-up appointments as scheduled.  Do not consume alcohol or use illegal drugs while on prescription medications. Report any adverse effects from your medications to your primary care provider promptly.  In the event of recurrent symptoms or worsening symptoms, call 911, a crisis hotline, or go to the nearest emergency department for evaluation.   

## 2021-08-28 ENCOUNTER — Ambulatory Visit (HOSPITAL_COMMUNITY)
Admission: AD | Admit: 2021-08-28 | Discharge: 2021-08-28 | Disposition: A | Discharge status: Home / Self Care | Payer: Medicaid Other | Attending: Psychiatry | Admitting: Psychiatry

## 2021-08-28 ENCOUNTER — Other Ambulatory Visit: Payer: Self-pay

## 2021-08-28 ENCOUNTER — Emergency Department (HOSPITAL_COMMUNITY)
Admission: EM | Admit: 2021-08-28 | Discharge: 2021-08-31 | Disposition: A | Discharge status: Home / Self Care | Payer: Medicaid Other | Attending: Emergency Medicine | Admitting: Emergency Medicine

## 2021-08-28 DIAGNOSIS — G2401 Drug induced subacute dyskinesia: Secondary | ICD-10-CM | POA: Diagnosis present

## 2021-08-28 DIAGNOSIS — F209 Schizophrenia, unspecified: Secondary | ICD-10-CM | POA: Diagnosis present

## 2021-08-28 DIAGNOSIS — F203 Undifferentiated schizophrenia: Secondary | ICD-10-CM | POA: Diagnosis present

## 2021-08-28 DIAGNOSIS — Z20822 Contact with and (suspected) exposure to covid-19: Secondary | ICD-10-CM | POA: Insufficient documentation

## 2021-08-28 DIAGNOSIS — R000 Tachycardia, unspecified: Secondary | ICD-10-CM | POA: Insufficient documentation

## 2021-08-28 LAB — COMPREHENSIVE METABOLIC PANEL
ALT: 16 U/L (ref 0–44)
AST: 25 U/L (ref 15–41)
Albumin: 3.9 g/dL (ref 3.5–5.0)
Alkaline Phosphatase: 79 U/L (ref 38–126)
Anion gap: 10 (ref 5–15)
BUN: 9 mg/dL (ref 6–20)
CO2: 26 mmol/L (ref 22–32)
Calcium: 9.4 mg/dL (ref 8.9–10.3)
Chloride: 105 mmol/L (ref 98–111)
Creatinine, Ser: 0.93 mg/dL (ref 0.61–1.24)
GFR, Estimated: >60 mL/min (ref >60)
Glucose, Bld: 144 mg/dL — ABNORMAL HIGH (ref 70–99)
Potassium: 3.7 mmol/L (ref 3.5–5.1)
Sodium: 141 mmol/L (ref 135–145)
Total Bilirubin: 0.5 mg/dL (ref 0.3–1.2)
Total Protein: 9.1 g/dL — ABNORMAL HIGH (ref 6.5–8.1)

## 2021-08-28 LAB — RESP PANEL BY RT-PCR (FLU A&B, COVID) ARPGX2
Influenza A by PCR: NEGATIVE
Influenza B by PCR: NEGATIVE
SARS Coronavirus 2 by RT PCR: NEGATIVE

## 2021-08-28 LAB — CBC WITH DIFFERENTIAL/PLATELET
Abs Immature Granulocytes: 0.04 10*3/uL (ref 0.00–0.07)
Basophils Absolute: 0.1 10*3/uL (ref 0.0–0.1)
Basophils Relative: 0 %
Eosinophils Absolute: 0.1 10*3/uL (ref 0.0–0.5)
Eosinophils Relative: 0 %
HCT: 39.7 % (ref 39.0–52.0)
Hemoglobin: 13.4 g/dL (ref 13.0–17.0)
Immature Granulocytes: 0 %
Lymphocytes Relative: 22 %
Lymphs Abs: 2.6 10*3/uL (ref 0.7–4.0)
MCH: 24.9 pg — ABNORMAL LOW (ref 26.0–34.0)
MCHC: 33.8 g/dL (ref 30.0–36.0)
MCV: 73.7 fL — ABNORMAL LOW (ref 80.0–100.0)
Monocytes Absolute: 1.3 10*3/uL — ABNORMAL HIGH (ref 0.1–1.0)
Monocytes Relative: 11 %
Neutro Abs: 7.6 10*3/uL (ref 1.7–7.7)
Neutrophils Relative %: 67 %
Platelets: 412 10*3/uL — ABNORMAL HIGH (ref 150–400)
RBC: 5.39 MIL/uL (ref 4.22–5.81)
RDW: 15.4 % (ref 11.5–15.5)
WBC: 11.6 10*3/uL — ABNORMAL HIGH (ref 4.0–10.5)
nRBC: 0 % (ref 0.0–0.2)

## 2021-08-28 LAB — SALICYLATE LEVEL: Salicylate Lvl: <7 mg/dL — ABNORMAL LOW (ref 7.0–30.0)

## 2021-08-28 LAB — CK: Total CK: 532 U/L — ABNORMAL HIGH (ref 49–397)

## 2021-08-28 LAB — ACETAMINOPHEN LEVEL: Acetaminophen (Tylenol), Serum: <10 ug/mL — ABNORMAL LOW (ref 10–30)

## 2021-08-28 LAB — ETHANOL: Alcohol, Ethyl (B): <10 mg/dL (ref <10)

## 2021-08-28 MED ORDER — MIDAZOLAM HCL (PF) 10 MG/2ML IJ SOLN
4.0000 mg | Freq: Once | INTRAMUSCULAR | Status: AC
  Administered 2021-08-28: 4 mg via INTRAMUSCULAR
  Filled 2021-08-28: qty 2

## 2021-08-28 MED ORDER — STERILE WATER FOR INJECTION IJ SOLN
INTRAMUSCULAR | Status: AC
  Administered 2021-08-28: 1.2 mL
  Filled 2021-08-28: qty 10

## 2021-08-28 MED ORDER — ZIPRASIDONE MESYLATE 20 MG IM SOLR
20.0000 mg | Freq: Once | INTRAMUSCULAR | Status: AC
  Administered 2021-08-28: 20 mg via INTRAMUSCULAR
  Filled 2021-08-28: qty 20

## 2021-08-28 NOTE — ED Provider Notes (Signed)
[facility]-EMERGENCY DEPT Provider Note   CSN: [id number] Arrival date & time: 08/28/21  1824     History  Chief Complaint  Patient presents with   Mental Health Problem    [name] is a 41 y.o. male.  Patient presents to the ER by his father, chief complaint of being schizophrenic and not taking his medications.  He has been physically acting out aggressive at home father states although the father states that he has been able to control the patient fairly well he is concerned that with all his medications he will not be able to do this continually.  Otherwise no reports of fevers or cough or vomiting or diarrhea patient is nonverbal not cooperative cannot obtain history from the patient.       Home Medications Prior to Admission medications   Medication Sig Start Date End Date Taking? Authorizing Provider  hydrOXYzine (ATARAX) 25 MG tablet Take 1 tablet (25 mg total) by mouth 3 (three) times daily as needed for anxiety. 08/15/21   Sarita Bottom, MD  mirtazapine (REMERON) 7.5 MG tablet Take 1 tablet (7.5 mg total) by mouth at bedtime. 08/15/21   Sarita Bottom, MD  OLANZapine zydis (ZYPREXA) 15 MG disintegrating tablet Take 1 tablet (15 mg total) by mouth at bedtime. 08/15/21   Sarita Bottom, MD  temazepam (RESTORIL) 15 MG capsule Take 1 capsule (15 mg total) by mouth at bedtime. 08/15/21   Sarita Bottom, MD  valbenazine (INGREZZA) 40 MG capsule Take 1 capsule (40 mg total) by mouth daily. 08/15/21   Sarita Bottom, MD      Allergies    Risperdal [risperidone]    Review of Systems   Review of Systems  Unable to perform ROS: Psychiatric disorder    Physical Exam Updated Vital Signs BP (!) 108/51 (BP Location: Right Arm)   Pulse (!) 112   Resp 18   SpO2 95%  Physical Exam Constitutional:      Appearance: He is well-developed.  HENT:     Head: Normocephalic.     Nose: Nose normal.  Eyes:     Extraocular Movements: Extraocular movements intact.   Cardiovascular:     Rate and Rhythm: Tachycardia present.  Pulmonary:     Effort: Pulmonary effort is normal.  Abdominal:     General: There is no distension.     Tenderness: There is no abdominal tenderness. There is no guarding.  Skin:    Coloration: Skin is not jaundiced.  Neurological:     Mental Status: He is alert.     Comments: Patient appears aggressive agitated, awake and alert pacing around and rocking in bed.     ED Results / Procedures / Treatments   Labs (all labs ordered are listed, but only abnormal results are displayed) Labs Reviewed  COMPREHENSIVE METABOLIC PANEL - Abnormal; Notable for the following components:      Result Value   Glucose, Bld 144 (*)    Total Protein 9.1 (*)    All other components within normal limits  CBC WITH DIFFERENTIAL/PLATELET - Abnormal; Notable for the following components:   WBC 11.6 (*)    MCV 73.7 (*)    MCH 24.9 (*)    Platelets 412 (*)    Monocytes Absolute 1.3 (*)    All other components within normal limits  SALICYLATE LEVEL - Abnormal; Notable for the following components:   Salicylate Lvl <7.0 (*)    All other components within normal limits  ACETAMINOPHEN LEVEL -  Abnormal; Notable for the following components:   Acetaminophen (Tylenol), Serum <10 (*)    All other components within normal limits  CK - Abnormal; Notable for the following components:   Total CK 532 (*)    All other components within normal limits  RESP PANEL BY RT-PCR (FLU A&B, COVID) ARPGX2  ETHANOL  RAPID URINE DRUG SCREEN, HOSP PERFORMED    EKG None  Radiology No results found.  Procedures Procedures    Medications Ordered in ED Medications  midazolam PF (VERSED) injection 4 mg (4 mg Intramuscular Given 08/28/21 2015)  ziprasidone (GEODON) injection 20 mg (20 mg Intramuscular Given 08/28/21 2227)  sterile water (preservative free) injection (1.2 mLs  Given 08/28/21 2233)    ED Course/ Medical Decision Making/ A&P                            Medical Decision Making Amount and/or Complexity of Data Reviewed Labs: ordered.  Risk Prescription drug management.   Review of systems shows prior visit in August 26, 2021 for schizophrenia and aggression.  History from the father.  Cardiac monitoring showing sinus rhythm.  Labs were sent chemistry appears unremarkable white count 11 hemoglobin 13 alcohol levels undetectable.  CK mildly elevated at 500.  Patient medically cleared pending TTS evaluation.  Placed on an IVC hold.  Father at 1 point stated that he thought he might of suggested the patient home, however states that he has other responsibilities and cannot continue to try to manage this patient schizophrenia at home.  Pending TTS evaluation.        Final Clinical Impression(s) / ED Diagnoses Final diagnoses:  Schizophrenia, unspecified type [facility])    Rx / DC Orders ED Discharge Orders     None         Cheryll Cockayne, MD 08/28/21 2235

## 2021-08-28 NOTE — ED Notes (Signed)
Pt refused temp at this time.

## 2021-08-28 NOTE — H&P (Signed)
Behavioral Health Medical Screening Exam  HPI: [name] is a 41 y.o. African-American male male who presents voluntarily me to [facility] as a walk-in accompanied by his father for refusing to take his medications and having symptoms of Tardive dyskinesia.  Patient has past psychiatric history of schizophrenia, tardive dyskinesia, and undifferentiated schizophrenia.  Patient lives at home with his father in [place].  Patient's father reports that patient has been refusing to take his medications for paranoid schizophrenia since August 22, 2021 and developing the symptoms of tardive dyskinesia. Patient receiving psychiatric illness treatment from [facility] mental health in [place]. Currently taking Mirtazapine for depression and Temazepam insomnia . He denies suicidal ideation, homicidal ideation, auditory or visual hallucinations, paranoia or delusions. Endorses family history of mental illness, with first cousin diagnosed with bipolar disorder. Patient reports, "my father tries to force me to take my medications, when I refused them." Denies drug use, alcohol use or dependence, or smoking cigarette or marijuana.  Denies access to weapons.  Denies criminal charges, pending charges upcoming court dates, or probation of status. Endorse sleeping for 3 hours last night, decreased appetite with few pounds of weight loss last for the past one month,  unable to specify how many pounds of weight loss.  On assessment today, patient is seen and examined face-to-face in the conference room.  Appears to be responding to internal stimuli with increasing restlessness and movement.  Chart reviewed and findings shared with the treatment team and consulted with Dr. Lucianne Muss.  Alert and unresponsive to name, place and partially situation.  Fair eye contact with provider during encounter.  Speech remains garbled with drooling.  Mood anxious and dysphoric with constricted affect.   Thought process disorganized and thought content illogical.  Memory is fair, judgment is impaired and insight right is shallow.  Disposition: Recommend patient to be transferred to [facility] long ED for medical clearance.  Safe transport called and patient transferred to [facility] long without any incidents.   Total Time spent with patient: 1 hour  Psychiatric Specialty Exam:  Presentation  General Appearance: Bizarre; Disheveled  Eye Contact:Good  Speech:Garbled  Speech Volume:Decreased  Handedness:Left  Mood and Affect  Mood:Anxious; Dysphoric  Affect:Constricted  Thought Process  Thought Processes:Disorganized  Descriptions of Associations:Circumstantial  Orientation:Partial  Thought Content:Illogical  History of Schizophrenia/Schizoaffective disorder:Yes  Duration of Psychotic Symptoms:Greater than six months  Hallucinations:Hallucinations: None  Ideas of Reference:None  Suicidal Thoughts:Suicidal Thoughts: No  Homicidal Thoughts:Homicidal Thoughts: No  Sensorium  Memory:Recent Fair; Remote Fair; Immediate Fair  Judgment:Impaired  Insight:Shallow  Executive Functions  Concentration:Fair  Attention Span:Fair  Recall:Poor  Fund of Knowledge:Fair  Language:Fair  Psychomotor Activity  Psychomotor Activity:Psychomotor Activity: Restlessness; Increased; Mannerisms Extrapyramidal Side Effects (EPS): Tardive Dyskinesia (Patient with Hx of Tardive Dsykinesia) AIMS Completed?: Yes  Assets  Assets:Housing; Social Support; Financial Resources/Insurance  Sleep  Sleep:Sleep: Poor Number of Hours of Sleep: 3  Physical Exam: Physical Exam Vitals and nursing note reviewed.  Constitutional:      Comments: Appears unkempt  HENT:     Head: Normocephalic and atraumatic.     Right Ear: External ear normal.     Left Ear: External ear normal.     Nose: Nose normal.     Mouth/Throat:     Mouth: Mucous membranes are moist.     Pharynx: Oropharynx is clear.   Eyes:     Extraocular Movements: Extraocular movements intact.     Conjunctiva/sclera: Conjunctivae normal.     Pupils:  Pupils are equal, round, and reactive to light.  Cardiovascular:     Comments: Patient refused VS Pulmonary:     Effort: Pulmonary effort is normal.  Abdominal:     Palpations: Abdomen is soft.  Genitourinary:    Comments: deferred Musculoskeletal:        General: Normal range of motion.     Cervical back: Normal range of motion and neck supple.  Skin:    General: Skin is warm.  Neurological:     General: No focal deficit present.     Mental Status: He is alert and oriented to person, place, and time.  Psychiatric:     Comments: Restlessness   Review of Systems  Constitutional: Negative.        Refused VS to be taken.  HENT: Negative.  Negative for hearing loss.   Eyes: Negative.  Negative for blurred vision and double vision.  Respiratory: Negative.  Negative for cough, sputum production, shortness of breath and wheezing.   Cardiovascular: Negative.  Negative for chest pain and palpitations.  Gastrointestinal: Negative.  Negative for abdominal pain, constipation, diarrhea, heartburn, nausea and vomiting.       Drooling   Genitourinary:  Negative for dysuria, frequency and urgency.  Musculoskeletal: Negative.  Negative for myalgias and neck pain.  Skin: Negative.  Negative for itching and rash.  Neurological: Negative.  Negative for dizziness, tingling and headaches.  Endo/Heme/Allergies: Negative.  Negative for environmental allergies and polydipsia. Does not bruise/bleed easily.       Risperdal [Risperidone] Risperdal [Risperidone]  Other (See Comments) Not Specified Contraindication 06/09/2020 Tardive dyskinesia    Psychiatric/Behavioral:  The patient is nervous/anxious and has insomnia.    There were no vitals taken for this visit. There is no height or weight on file to calculate BMI.  Musculoskeletal: Strength & Muscle Tone: within normal  limits Gait & Station: unsteady Patient leans: N/A  [place] Scale:  Flowsheet Row [facility]-Admission (Discharged) from [facility] from [facility]/2021 in [facility]  C-SSRS RISK CATEGORY No Risk No Risk No Risk       Recommendations:  Based on my evaluation the patient does not appear to have an emergency medical condition.  Cecilie Lowers, FNP 08/28/2021, 6:23 PM

## 2021-08-28 NOTE — ED Provider Triage Note (Signed)
Emergency Medicine Provider Triage Evaluation Note  [name] , a 41 y.o. male  was evaluated in triage.  Pt complains of mental health crisis.  Patient history of schizophrenia, tardive dyskinesia.  Father provides majority of history.  Patient has been physically aggressive with family.  Acting out.  Family feels he needs inpatient admission.  He has been refusing to take his medications at home.  Initially went to be [facility] however redirected here  Review of Systems  Positive: Medication noncompliance, aggressive behavior Negative:   Physical Exam  BP (!) 108/51 (BP Location: Right Arm)   Pulse (!) 112   Resp 18   SpO2 95%  Gen:   Awake, no distress   Resp:  Normal effort  MSK:   Moves extremities without difficulty  Other:  Agitated however redirectable by father  Medical Decision Making  Medically screening exam initiated at 7:27 PM.  Appropriate orders placed.  [name] was informed that the remainder of the evaluation will be completed by another provider, this initial triage assessment does not replace that evaluation, and the importance of remaining in the ED until their evaluation is complete.  Agitation   Mia Milan A, PA-C 08/28/21 1928

## 2021-08-28 NOTE — ED Triage Notes (Signed)
41 yo male present to ED with father. Pt's father is his care taker who states that patient has not taken his medications for over one week and is now In mental crisis. Pt has history of paranoid schizophrenia. He is currently taking temazepam. Pt's father states that pt is physically aggressive but mostly towards him where he has had to restrain him. Pt's father states they went to [facility] but they were redirected to [facility] ED as [facility] was full.

## 2021-08-29 LAB — RAPID URINE DRUG SCREEN, HOSP PERFORMED
Amphetamines: NOT DETECTED
Barbiturates: NOT DETECTED
Benzodiazepines: POSITIVE — AB
Cocaine: NOT DETECTED
Opiates: NOT DETECTED
Tetrahydrocannabinol: NOT DETECTED

## 2021-08-29 MED ORDER — HYDROXYZINE HCL 25 MG PO TABS
25.0000 mg | ORAL_TABLET | Freq: Three times a day (TID) | ORAL | Status: DC | PRN
  Administered 2021-08-29 – 2021-08-31 (×3): 25 mg via ORAL
  Filled 2021-08-29 (×3): qty 1

## 2021-08-29 MED ORDER — VALBENAZINE TOSYLATE 40 MG PO CAPS
40.0000 mg | ORAL_CAPSULE | Freq: Every day | ORAL | Status: DC
  Administered 2021-08-29 – 2021-08-31 (×3): 40 mg via ORAL
  Filled 2021-08-29 (×3): qty 1

## 2021-08-29 MED ORDER — LORAZEPAM 2 MG/ML IJ SOLN
2.0000 mg | Freq: Once | INTRAMUSCULAR | Status: AC
  Administered 2021-08-29: 2 mg via INTRAMUSCULAR
  Filled 2021-08-29: qty 1

## 2021-08-29 MED ORDER — TEMAZEPAM 15 MG PO CAPS
15.0000 mg | ORAL_CAPSULE | Freq: Every day | ORAL | Status: DC
  Administered 2021-08-29 – 2021-08-30 (×2): 15 mg via ORAL
  Filled 2021-08-29 (×2): qty 1

## 2021-08-29 MED ORDER — MIRTAZAPINE 7.5 MG PO TABS
7.5000 mg | ORAL_TABLET | Freq: Every day | ORAL | Status: DC
  Administered 2021-08-29 – 2021-08-30 (×2): 7.5 mg via ORAL
  Filled 2021-08-29 (×2): qty 1

## 2021-08-29 MED ORDER — OLANZAPINE 10 MG PO TBDP
10.0000 mg | ORAL_TABLET | Freq: Every day | ORAL | Status: DC
  Administered 2021-08-29 – 2021-08-30 (×2): 10 mg via ORAL
  Filled 2021-08-29 (×2): qty 1

## 2021-08-29 NOTE — ED Notes (Signed)
Safety sitter assisting pt to shower.

## 2021-08-29 NOTE — ED Notes (Signed)
Pt walking out into hallway and closing the door behind him. RN redirected and asked pt to return to his room and lay down in stretcher. Pt agreeable and did lay down on stretcher. Pt is reminded by sitter often to stay in stretcher.

## 2021-08-29 NOTE — Consult Note (Signed)
[facility] ED ASSESSMENT   Reason for Consult:  Psychiatry Evaluation Referring Physician:  ER Physician Patient Identification: [name] MRN:  [id number] ED Chief Complaint: Undifferentiated schizophrenia [facility])  Diagnosis:  Principal Problem:   Undifferentiated schizophrenia ([place]) Active Problems:   Tardive dyskinesia   ED Assessment Time Calculation: Start Time: [id number] Stop Time: 1420 Total Time in Minutes (Assessment Completion): 31   Subjective:   [name] is a 41 y.o. male patient admitted with hx of Schizophrenia and not taking medications, not eating and paranoid and agitated towards his dad.  Patient was hospitalized early this month at [facility] and since then he has been seen at in the ER twice.  HPI: Provider saw patient this morning and he admitted not taking his medications and not eating.  He stated that he would like to eat fast food twice a week and his father denies him that.  He did not state the reason for not taking his medications.  He reported that occasionally he hears voices but does not see things randomly.  He sleeps only 3 hours at night.  Patient lives with his father in [place]. Collateral information from his dad: Father reported that patient has not been compliant with his medications.  He also reported that patient is paranoid and agitated towards him.  Father reported that he passes days without eating any food.  Patient has Tardive Dyskinesia and not taking his medication Ingrezza. Patient is on Olanzapine 15 mg and this will be decreased to 10 mg.  He meets criteria for inpatient hospitalization while we treat him in the ER.   Patient denied SI/HI/VH.  Father is asking for CT head to evaluate the Hydrocephalus SP Shunt but was informed this is outpatient management .  We will resume home medications.  Past Psychiatric History: Schizophrenia, Tardive Dyskinesia, Hydrocephalus SP Shunt, and multiple hospitalizations.  Recent inpatient Psychiatric  hospitalization at [facility], Multiple ER visits as well.  Risk to Self or Others: Is the patient at risk to self? No Has the patient been a risk to self in the past 6 months? No Has the patient been a risk to self within the distant past? No Is the patient a risk to others? No Has the patient been a risk to others in the past 6 months? No Has the patient been a risk to others within the distant past? No  [place] Scale:  [place] ED from 08/28/2021 in [place] DEPT Most recent reading at 08/28/2021  7:13 PM OP Visit from 08/28/2021 in [facility] Most recent reading at 08/28/2021  6:22 PM ED to Hosp-Admission (Discharged) from 08/04/2021 in [facility] Most recent reading at 08/06/2021 12:53 PM  C-SSRS RISK CATEGORY No Risk No Risk No Risk       AIMS:  , , ,  ,   ASAM:    Substance Abuse:     Past Medical History:  Past Medical History:  Diagnosis Date   Schizophrenia ([place])     Past Surgical History:  Procedure Laterality Date   shunt surgery     Family History:  Family History  Problem Relation Age of Onset   Stroke Mother    Thyroid disease Father    Family Psychiatric  History: Maternal Aunt: some unknown diagnosis No known Substance Abuse or Suicides Social History:  Social History   Substance and Sexual Activity  Alcohol Use Not Currently     Social History  Substance and Sexual Activity  Drug Use Not Currently   Types: Marijuana    Social History   Socioeconomic History   Marital status: Single    Spouse name: Not on file   Number of children: Not on file   Years of education: Not on file   Highest education level: Not on file  Occupational History   Not on file  Tobacco Use   Smoking status: Every Day    Types: Cigars   Smokeless tobacco: Never  Vaping Use   Vaping Use: Former  Substance and Sexual Activity   Alcohol use: Not Currently   Drug  use: Not Currently    Types: Marijuana   Sexual activity: Not on file  Other Topics Concern   Not on file  Social History Narrative   Not on file   Social Determinants of Health   Financial Resource Strain: Not on file  Food Insecurity: Not on file  Transportation Needs: Not on file  Physical Activity: Not on file  Stress: Not on file  Social Connections: Not on file   Additional Social History:    Allergies:   Allergies  Allergen Reactions   Risperdal [Risperidone] Other (See Comments)    Tardive dyskinesia    Labs:  Results for orders placed or performed during the hospital encounter of 08/28/21 (from the past 48 hour(s))  Resp Panel by RT-PCR (Flu A&B, Covid) Anterior Nasal Swab     Status: None   Collection Time: 08/28/21  7:28 PM   Specimen: Anterior Nasal Swab  Result Value Ref Range   SARS Coronavirus 2 by RT PCR NEGATIVE NEGATIVE    Comment: (NOTE) SARS-CoV-2 target nucleic acids are NOT DETECTED.  The SARS-CoV-2 RNA is generally detectable in upper respiratory specimens during the acute phase of infection. The lowest concentration of SARS-CoV-2 viral copies this assay can detect is 138 copies/mL. A negative result does not preclude SARS-Cov-2 infection and should not be used as the sole basis for treatment or other patient management decisions. A negative result may occur with  improper specimen collection/handling, submission of specimen other than nasopharyngeal swab, presence of viral mutation(s) within the areas targeted by this assay, and inadequate number of viral copies(<138 copies/mL). A negative result must be combined with clinical observations, patient history, and epidemiological information. The expected result is Negative.  Fact Sheet for Patients:  EntrepreneurPulse.com.au  Fact Sheet for Healthcare Providers:  IncredibleEmployment.be  This test is no t yet approved or cleared by the [place] FDA  and  has been authorized for detection and/or diagnosis of SARS-CoV-2 by FDA under an Emergency Use Authorization (EUA). This EUA will remain  in effect (meaning this test can be used) for the duration of the COVID-19 declaration under Section 564(b)(1) of the Act, 21 U.S.C.section 360bbb-3(b)(1), unless the authorization is terminated  or revoked sooner.       Influenza A by PCR NEGATIVE NEGATIVE   Influenza B by PCR NEGATIVE NEGATIVE    Comment: (NOTE) The Xpert Xpress SARS-CoV-2/FLU/RSV plus assay is intended as an aid in the diagnosis of influenza from Nasopharyngeal swab specimens and should not be used as a sole basis for treatment. Nasal washings and aspirates are unacceptable for Xpert Xpress SARS-CoV-2/FLU/RSV testing.  Fact Sheet for Patients: EntrepreneurPulse.com.au  Fact Sheet for Healthcare Providers: IncredibleEmployment.be  This test is not yet approved or cleared by the [place] FDA and has been authorized for detection and/or diagnosis of SARS-CoV-2 by FDA under an Emergency Use Authorization (  EUA). This EUA will remain in effect (meaning this test can be used) for the duration of the COVID-19 declaration under Section 564(b)(1) of the Act, 21 U.S.C. section 360bbb-3(b)(1), unless the authorization is terminated or revoked.  Performed at [facility], [address], [place], [place] [address]   Comprehensive metabolic panel     Status: Abnormal   Collection Time: 08/28/21  7:50 PM  Result Value Ref Range   Sodium 141 135 - 145 mmol/L   Potassium 3.7 3.5 - 5.1 mmol/L   Chloride 105 98 - 111 mmol/L   CO2 26 22 - 32 mmol/L   Glucose, Bld 144 (H) 70 - 99 mg/dL    Comment: Glucose reference range applies only to samples taken after fasting for at least 8 hours.   BUN 9 6 - 20 mg/dL   Creatinine, Ser 2.69 0.61 - 1.24 mg/dL   Calcium 9.4 8.9 - 48.5 mg/dL   Total Protein 9.1 (H) 6.5 - 8.1 g/dL    Albumin 3.9 3.5 - 5.0 g/dL   AST 25 15 - 41 U/L   ALT 16 0 - 44 U/L   Alkaline Phosphatase 79 38 - 126 U/L   Total Bilirubin 0.5 0.3 - 1.2 mg/dL   GFR, Estimated >46 >27 mL/min    Comment: (NOTE) Calculated using the CKD-EPI Creatinine Equation (2021)    Anion gap 10 5 - 15    Comment: Performed at [facility], [address] [place], [place] [address]  Ethanol     Status: None   Collection Time: 08/28/21  7:50 PM  Result Value Ref Range   Alcohol, Ethyl (B) <10 <10 mg/dL    Comment: (NOTE) Lowest detectable limit for serum alcohol is 10 mg/dL.  For medical purposes only. Performed at [facility], [address] [place], [place] [address]   CBC with Diff     Status: Abnormal   Collection Time: 08/28/21  7:50 PM  Result Value Ref Range   WBC 11.6 (H) 4.0 - 10.5 K/uL   RBC 5.39 4.22 - 5.81 MIL/uL   Hemoglobin 13.4 13.0 - 17.0 g/dL   HCT 29.9 37.1 - 69.6 %   MCV 73.7 (L) 80.0 - 100.0 fL   MCH 24.9 (L) 26.0 - 34.0 pg   MCHC 33.8 30.0 - 36.0 g/dL   RDW 78.9 38.1 - 01.7 %   Platelets 412 (H) 150 - 400 K/uL   nRBC 0.0 0.0 - 0.2 %   Neutrophils Relative % 67 %   Neutro Abs 7.6 1.7 - 7.7 K/uL   Lymphocytes Relative 22 %   Lymphs Abs 2.6 0.7 - 4.0 K/uL   Monocytes Relative 11 %   Monocytes Absolute 1.3 (H) 0.1 - 1.0 K/uL   Eosinophils Relative 0 %   Eosinophils Absolute 0.1 0.0 - 0.5 K/uL   Basophils Relative 0 %   Basophils Absolute 0.1 0.0 - 0.1 K/uL   Immature Granulocytes 0 %   Abs Immature Granulocytes 0.04 0.00 - 0.07 K/uL    Comment: Performed at [facility], [address] [place], [place] [address]  Salicylate level     Status: Abnormal   Collection Time: 08/28/21  7:50 PM  Result Value Ref Range   Salicylate Lvl <7.0 (L) 7.0 - 30.0 mg/dL    Comment: Performed at [facility], [address] [place], [place] [address]  Acetaminophen level     Status: Abnormal   Collection Time:  08/28/21  7:50 PM  Result Value Ref Range   Acetaminophen (Tylenol), Serum <10 (L) 10 - 30 ug/mL    Comment: (NOTE) Therapeutic concentrations vary significantly. A range of 10-30 ug/mL  may be an effective concentration for many patients. However, some  are best treated at concentrations outside of this range. Acetaminophen concentrations >150 ug/mL at 4 hours after ingestion  and >50 ug/mL at 12 hours after ingestion are often associated with  toxic reactions.  Performed at [facility], [place] [address], [place], [place] [address]   CK     Status: Abnormal   Collection Time: 08/28/21  7:50 PM  Result Value Ref Range   Total CK 532 (H) 49 - 397 U/L    Comment: Performed at [facility], [place] [address], [place], [place] [address]  Urine rapid drug screen (hosp performed)     Status: Abnormal   Collection Time: 08/29/21  2:09 AM  Result Value Ref Range   Opiates NONE DETECTED NONE DETECTED   Cocaine NONE DETECTED NONE DETECTED   Benzodiazepines POSITIVE (A) NONE DETECTED   Amphetamines NONE DETECTED NONE DETECTED   Tetrahydrocannabinol NONE DETECTED NONE DETECTED   Barbiturates NONE DETECTED NONE DETECTED    Comment: (NOTE) DRUG SCREEN FOR MEDICAL PURPOSES ONLY.  IF CONFIRMATION IS NEEDED FOR ANY PURPOSE, NOTIFY LAB WITHIN 5 DAYS.  LOWEST DETECTABLE LIMITS FOR URINE DRUG SCREEN Drug Class                     Cutoff (ng/mL) Amphetamine and metabolites    1000 Barbiturate and metabolites    200 Benzodiazepine                 [id number] Tricyclics and metabolites     300 Opiates and metabolites        300 Cocaine and metabolites        300 THC                            50 Performed at [facility], [place] [address] [place], [place] [address]     Current Facility-Administered Medications  Medication Dose Route Frequency Provider Last Rate Last Admin   hydrOXYzine (ATARAX) tablet 25 mg  25 mg Oral TID PRN Charmaine Downs  C, NP       mirtazapine (REMERON) tablet 7.5 mg  7.5 mg Oral QHS Pasha Gadison C, NP       OLANZapine zydis (ZYPREXA) disintegrating tablet 10 mg  10 mg Oral QHS Audi Conover C, NP       temazepam (RESTORIL) capsule 15 mg  15 mg Oral QHS Maisey Deandrade C, NP       valbenazine (INGREZZA) capsule 40 mg  40 mg Oral Daily Charmaine Downs C, NP       Current Outpatient Medications  Medication Sig Dispense Refill   hydrOXYzine (ATARAX) 25 MG tablet Take 1 tablet (25 mg total) by mouth 3 (three) times daily as needed for anxiety. 30 tablet 0   mirtazapine (REMERON) 7.5 MG tablet Take 1 tablet (7.5 mg total) by mouth at bedtime. 30 tablet 0   OLANZapine zydis (ZYPREXA) 15 MG disintegrating tablet Take 1 tablet (15 mg total) by mouth at bedtime. 30 tablet 0   temazepam (RESTORIL) 15 MG capsule Take 1 capsule (15 mg total) by mouth at bedtime. 30 capsule 0   valbenazine (INGREZZA) 40 MG capsule Take 1 capsule (  40 mg total) by mouth daily. 30 capsule 0    Musculoskeletal: Strength & Muscle Tone: within normal limits Gait & Station: normal Patient leans: Front   Psychiatric Specialty Exam: Presentation  General Appearance: Casual; Disheveled  Eye Contact:Good  Speech:Garbled; Slurred  Speech Volume:Normal  Handedness:Right   Mood and Affect  Mood:Angry; Irritable  Affect:Congruent   Thought Process  Thought Processes:Coherent; Goal Directed; Linear  Descriptions of Associations:Intact  Orientation:Full (Time, Place and Person)  Thought Content:Logical  History of Schizophrenia/Schizoaffective disorder:No  Duration of Psychotic Symptoms:Greater than six months  Hallucinations:Hallucinations: Auditory Description of Auditory Hallucinations: Unable to describe what he is hearing.  Ideas of Reference:None  Suicidal Thoughts:Suicidal Thoughts: No  Homicidal Thoughts:Homicidal Thoughts: No   Sensorium  Memory:Immediate Fair; Recent Fair; Remote  Fair  Judgment:Poor  Insight:Fair   Executive Functions  Concentration:Fair  Attention Span:Fair  Recall:Fair  Fund of Knowledge:Fair  Language:Fair   Psychomotor Activity  Psychomotor Activity:Psychomotor Activity: Normal Extrapyramidal Side Effects (EPS): Tardive Dyskinesia AIMS Completed?: No   Assets  Assets:Communication Skills; Housing; Social Support    Sleep  Sleep:Sleep: Fair Number of Hours of Sleep: 3   Physical Exam: Physical Exam Vitals and nursing note reviewed.  Constitutional:      Appearance: Normal appearance.  HENT:     Head:     Comments: Hydrocephalus shunt in the Brain  Cardiovascular:     Rate and Rhythm: Normal rate and regular rhythm.  Pulmonary:     Effort: Pulmonary effort is normal.  Musculoskeletal:        General: Normal range of motion.     Cervical back: Normal range of motion.  Skin:    General: Skin is warm and dry.  Neurological:     Mental Status: He is alert and oriented to person, place, and time.    Review of Systems  Constitutional: Negative.   HENT: Negative.    Eyes: Negative.   Respiratory: Negative.    Cardiovascular: Negative.   Gastrointestinal: Negative.   Genitourinary: Negative.   Musculoskeletal: Negative.   Skin: Negative.   Neurological:        Tardive Dyskinesia  Psychiatric/Behavioral:  Positive for hallucinations. The patient has insomnia.        Paranoia, agitation towards dad   Blood pressure 140/84, pulse 81, temperature 97.6 F (36.4 C), temperature source Oral, resp. rate 18, SpO2 97 %. There is no height or weight on file to calculate BMI.  Medical Decision Making: Patient meets criteria for inpatient Psychiatric hospitalization for safety and stabilization.  Olanzapine will be decreased to 10 mg at night time due to side effect-Tardive Dyskinesia.  We will seek bed placement at any bed that has available bed.  We will resume home medications.  Problem  1: Schizophrenia-undifferentiated  Problem 2: Paranoia  Problem 3: Tardive Dyskinesia-Medication related.  Disposition:  Admit, seek bed placement.  Earney Navy, NP-PMHNP-BC 08/29/2021 2:22 PM

## 2021-08-29 NOTE — ED Notes (Signed)
Pt is currently alert and redirectable. Offered fluids. Sitter at bedside. Environment secured, drawers locked at this time. Pt was dressed out during prior shift.

## 2021-08-29 NOTE — ED Provider Notes (Signed)
Emergency Medicine Observation Re-evaluation Note  QADIR FOLKS is a 41 y.o. male, seen on rounds today.  Pt initially presented to the ED for complaints of Mental Health Problem and IVC Currently, the patient is resting comfortably, however patient was agitated overnight.  Physical Exam  BP 140/84 (BP Location: Left Arm)   Pulse 81   Temp 97.6 F (36.4 C) (Oral)   Resp 18   SpO2 97%  Physical Exam General: No acute distress Cardiac: Regular rate Lungs: No respiratory distress Psych: Currently calm  ED Course / MDM  EKG:   I have reviewed the labs performed to date as well as medications administered while in observation.  Recent changes in the last 24 hours include patient had few events of agitation, required physical restraints and chemical restraints.  He received Ativan at 6 in the morning.  Since then he has remained calm, likely because of the Ativan  Plan  Current plan is for patient has not been seen by psychiatry service.  Dispo per psychiatry team. [name] is under involuntary commitment.      Derwood Kaplan, MD 08/29/21 1123

## 2021-08-29 NOTE — ED Notes (Signed)
Pt has returned to room after showering. Sitter at bedside.

## 2021-08-29 NOTE — ED Notes (Signed)
Pt increasingly agitated and pacing around the room. Pt redirected and ask to sit on stretcher for IM injection. Pt whispering and stating "I am not on drugs, I don't do drugs."

## 2021-08-29 NOTE — ED Notes (Signed)
Pt was increasingly agitated with sitter. Attempted to leave. Pt was redirected by sitter, NT, and RN. Pt agitation increased as staff informed him he could not leave. Pt swinging arms and not following instructions. RN verbalized increased concern of pts behavior, concerns for pt and staff safety with EDP Madilyn Hook. Orders for IM ativan placed. Pt required extensive redirections by staff. Pt was able to sit down on stretcher. IM ativan given to right deltoid by [employer]. Sitter provided pt with PO fluids and sandwich. Security at bedside with sitter.

## 2021-08-30 DIAGNOSIS — F203 Undifferentiated schizophrenia: Secondary | ICD-10-CM

## 2021-08-30 MED ORDER — STERILE WATER FOR INJECTION IJ SOLN
INTRAMUSCULAR | Status: AC
  Filled 2021-08-30: qty 10

## 2021-08-30 MED ORDER — ZIPRASIDONE MESYLATE 20 MG IM SOLR
10.0000 mg | Freq: Once | INTRAMUSCULAR | Status: AC
  Administered 2021-08-30: 10 mg via INTRAMUSCULAR
  Filled 2021-08-30: qty 20

## 2021-08-30 MED ORDER — LORAZEPAM 1 MG PO TABS
1.0000 mg | ORAL_TABLET | Freq: Once | ORAL | Status: AC
  Administered 2021-08-30: 1 mg via ORAL
  Filled 2021-08-30: qty 1

## 2021-08-30 MED ORDER — ZIPRASIDONE MESYLATE 20 MG IM SOLR: 20.0000 mg | Freq: Once | INTRAMUSCULAR | Status: DC

## 2021-08-30 NOTE — ED Notes (Addendum)
Error

## 2021-08-30 NOTE — Consult Note (Signed)
[facility] ED Progress Note    Patient Identification: [name] MRN:  [id number] ED Chief Complaint: Undifferentiated schizophrenia [facility])  Diagnosis:  Principal Problem:   Undifferentiated schizophrenia (HCC) Active Problems:   Tardive dyskinesia   ED Assessment Time Calculation: Start Time: 1200 Stop Time: 1210 Total Time in Minutes (Assessment Completion): 10   Subjective:   [name] is a 41 y.o. male patient admitted with hx of Schizophrenia and not taking medications, not eating and paranoid and agitated towards his dad.  Patient was hospitalized early this month at [facility] and since then he has been seen at in the ER twice. Pt has a history of schizophrenia, hydrocephalus SP shunt, and tardive dyskinesia.   On assessment today, pt refuses to participate. Pt does not verbally respond back to our questions and he only mumbles. At one point, the patient does respond "no" when asked if we would answer questions. Tardive dyskinesia is notable.   Per nursing notes, yesterday morning he became agitated with his sitter and tried to elope. His agitation increased when the sitter told him that he can't leave. He received IM 2 mg of ativan IM on 08/29/2021. This morning he has been resting comfortably in his bed. He has been taking his medications po including his ingrezza. Patient's sitter reports that the patient has been primarily sitting on the side of his bed. States that he will talk at times. Sitter denies that the patient has been aggressive or agitated today.   Collateral information from his dad on 08/29/21: Father reported that patient has not been compliant with his medications.  He also reported that patient is paranoid and agitated towards him.  Father reported that he passes days without eating any food.  Patient has Tardive Dyskinesia and not taking his medication Ingrezza. Patient is on Olanzapine 15 mg and this will be decreased to 10 mg.  He meets criteria for inpatient  hospitalization while we treat him in the ER.   Patient denied SI/HI/VH.  Father is asking for CT head to evaluate the Hydrocephalus SP Shunt but was informed this is outpatient management .  We will resume home medications.  Past Psychiatric History: Schizophrenia, Tardive Dyskinesia, Hydrocephalus SP Shunt, and multiple hospitalizations.  Recent inpatient Psychiatric hospitalization at [facility], Multiple ER visits as well.   [place] Scale:  Flowsheet Row ED from 08/28/2021 in [place]  [facility] Most recent reading at 08/28/2021  7:13 PM [facility] Most recent reading at 08/28/2021  6:22 PM ED to Hosp-Admission (Discharged) from [facility]20[facility] Most recent reading at 08/06/2021 12:53 PM  C-SSRS RISK CATEGORY No Risk No Risk No Risk       AIMS:  , , ,  ,   ASAM:    Substance Abuse:     Past Medical History:  Past Medical History:  Diagnosis Date   Schizophrenia (HCC)     Past Surgical History:  Procedure Laterality Date   shunt surgery     Family History:  Family History  Problem Relation Age of Onset   Stroke Mother    Thyroid disease Father    Family Psychiatric  History: Maternal Aunt: some unknown diagnosis No known Substance Abuse or Suicides Social History:  Social History   Substance and Sexual Activity  Alcohol Use Not Currently     Social History   Substance and Sexual Activity  Drug Use Not Currently  Types: Marijuana    Social History   Socioeconomic History   Marital status: Single    Spouse name: Not on file   Number of children: Not on file   Years of education: Not on file   Highest education level: Not on file  Occupational History   Not on file  Tobacco Use   Smoking status: Every Day    Types: Cigars   Smokeless tobacco: Never  Vaping Use   Vaping Use: Former  Substance and Sexual Activity   Alcohol use: Not  Currently   Drug use: Not Currently    Types: Marijuana   Sexual activity: Not on file  Other Topics Concern   Not on file  Social History Narrative   Not on file   Social Determinants of Health   Financial Resource Strain: Not on file  Food Insecurity: Not on file  Transportation Needs: Not on file  Physical Activity: Not on file  Stress: Not on file  Social Connections: Not on file   Additional Social History:    Allergies:   Allergies  Allergen Reactions   Risperdal [Risperidone] Other (See Comments)    Tardive dyskinesia    Labs:  Results for orders placed or performed during the hospital encounter of 08/28/21 (from the past 48 hour(s))  Resp Panel by RT-PCR (Flu A&B, Covid) Anterior Nasal Swab     Status: None   Collection Time: 08/28/21  7:28 PM   Specimen: Anterior Nasal Swab  Result Value Ref Range   SARS Coronavirus 2 by RT PCR NEGATIVE NEGATIVE    Comment: (NOTE) SARS-CoV-2 target nucleic acids are NOT DETECTED.  The SARS-CoV-2 RNA is generally detectable in upper respiratory specimens during the acute phase of infection. The lowest concentration of SARS-CoV-2 viral copies this assay can detect is 138 copies/mL. A negative result does not preclude SARS-Cov-2 infection and should not be used as the sole basis for treatment or other patient management decisions. A negative result may occur with  improper specimen collection/handling, submission of specimen other than nasopharyngeal swab, presence of viral mutation(s) within the areas targeted by this assay, and inadequate number of viral copies(<138 copies/mL). A negative result must be combined with clinical observations, patient history, and epidemiological information. The expected result is Negative.  Fact Sheet for Patients:  EntrepreneurPulse.com.au  Fact Sheet for Healthcare Providers:  IncredibleEmployment.be  This test is no t yet approved or cleared by the  [place] FDA and  has been authorized for detection and/or diagnosis of SARS-CoV-2 by FDA under an Emergency Use Authorization (EUA). This EUA will remain  in effect (meaning this test can be used) for the duration of the COVID-19 declaration under Section 564(b)(1) of the Act, 21 U.S.C.section 360bbb-3(b)(1), unless the authorization is terminated  or revoked sooner.       Influenza A by PCR NEGATIVE NEGATIVE   Influenza B by PCR NEGATIVE NEGATIVE    Comment: (NOTE) The Xpert Xpress SARS-CoV-2/FLU/RSV plus assay is intended as an aid in the diagnosis of influenza from Nasopharyngeal swab specimens and should not be used as a sole basis for treatment. Nasal washings and aspirates are unacceptable for Xpert Xpress SARS-CoV-2/FLU/RSV testing.  Fact Sheet for Patients: EntrepreneurPulse.com.au  Fact Sheet for Healthcare Providers: IncredibleEmployment.be  This test is not yet approved or cleared by the [place] FDA and has been authorized for detection and/or diagnosis of SARS-CoV-2 by FDA under an Emergency Use Authorization (EUA). This EUA will remain in effect (meaning this test can  be used) for the duration of the COVID-19 declaration under Section 564(b)(1) of the Act, 21 U.S.C. section 360bbb-3(b)(1), unless the authorization is terminated or revoked.  Performed at [facility], [place] [address], [place], [place] [address]   Comprehensive metabolic panel     Status: Abnormal   Collection Time: 08/28/21  7:50 PM  Result Value Ref Range   Sodium 141 135 - 145 mmol/L   Potassium 3.7 3.5 - 5.1 mmol/L   Chloride 105 98 - 111 mmol/L   CO2 26 22 - 32 mmol/L   Glucose, Bld 144 (H) 70 - 99 mg/dL    Comment: Glucose reference range applies only to samples taken after fasting for at least 8 hours.   BUN 9 6 - 20 mg/dL   Creatinine, Ser 0.93 0.61 - 1.24 mg/dL   Calcium 9.4 8.9 - 10.3 mg/dL   Total Protein 9.1 (H) 6.5  - 8.1 g/dL   Albumin 3.9 3.5 - 5.0 g/dL   AST 25 15 - 41 U/L   ALT 16 0 - 44 U/L   Alkaline Phosphatase 79 38 - 126 U/L   Total Bilirubin 0.5 0.3 - 1.2 mg/dL   GFR, Estimated >60 >60 mL/min    Comment: (NOTE) Calculated using the CKD-EPI Creatinine Equation (2021)    Anion gap 10 5 - 15    Comment: Performed at [facility], [place] [address] [place], [place] [address]  Ethanol     Status: None   Collection Time: 08/28/21  7:50 PM  Result Value Ref Range   Alcohol, Ethyl (B) <10 <10 mg/dL    Comment: (NOTE) Lowest detectable limit for serum alcohol is 10 mg/dL.  For medical purposes only. Performed at [facility], [place] [address] [place], [place] [address]   CBC with Diff     Status: Abnormal   Collection Time: 08/28/21  7:50 PM  Result Value Ref Range   WBC 11.6 (H) 4.0 - 10.5 K/uL   RBC 5.39 4.22 - 5.81 MIL/uL   Hemoglobin 13.4 13.0 - 17.0 g/dL   HCT 39.7 39.0 - 52.0 %   MCV 73.7 (L) 80.0 - 100.0 fL   MCH 24.9 (L) 26.0 - 34.0 pg   MCHC 33.8 30.0 - 36.0 g/dL   RDW 15.4 11.5 - 15.5 %   Platelets 412 (H) 150 - 400 K/uL   nRBC 0.0 0.0 - 0.2 %   Neutrophils Relative % 67 %   Neutro Abs 7.6 1.7 - 7.7 K/uL   Lymphocytes Relative 22 %   Lymphs Abs 2.6 0.7 - 4.0 K/uL   Monocytes Relative 11 %   Monocytes Absolute 1.3 (H) 0.1 - 1.0 K/uL   Eosinophils Relative 0 %   Eosinophils Absolute 0.1 0.0 - 0.5 K/uL   Basophils Relative 0 %   Basophils Absolute 0.1 0.0 - 0.1 K/uL   Immature Granulocytes 0 %   Abs Immature Granulocytes 0.04 0.00 - 0.07 K/uL    Comment: Performed at [facility], [place] [address] [place], [place] [id number]  Salicylate level     Status: Abnormal   Collection Time: 08/28/21  7:50 PM  Result Value Ref Range   Salicylate Lvl Q000111Q (L) 7.0 - 30.0 mg/dL    Comment: Performed at [facility], [place] [address] [place], [place] [address]  Acetaminophen level     Status: Abnormal    Collection Time: 08/28/21  7:50 PM  Result Value Ref Range  Acetaminophen (Tylenol), Serum <10 (L) 10 - 30 ug/mL    Comment: (NOTE) Therapeutic concentrations vary significantly. A range of 10-30 ug/mL  may be an effective concentration for many patients. However, some  are best treated at concentrations outside of this range. Acetaminophen concentrations >150 ug/mL at 4 hours after ingestion  and >50 ug/mL at 12 hours after ingestion are often associated with  toxic reactions.  Performed at [facility], [address], [place], [place] [address]   CK     Status: Abnormal   Collection Time: 08/28/21  7:50 PM  Result Value Ref Range   Total CK 532 (H) 49 - 397 U/L    Comment: Performed at [facility], [address], [place], [place] [address]  Urine rapid drug screen (hosp performed)     Status: Abnormal   Collection Time: 08/29/21  2:09 AM  Result Value Ref Range   Opiates NONE DETECTED NONE DETECTED   Cocaine NONE DETECTED NONE DETECTED   Benzodiazepines POSITIVE (A) NONE DETECTED   Amphetamines NONE DETECTED NONE DETECTED   Tetrahydrocannabinol NONE DETECTED NONE DETECTED   Barbiturates NONE DETECTED NONE DETECTED    Comment: (NOTE) DRUG SCREEN FOR MEDICAL PURPOSES ONLY.  IF CONFIRMATION IS NEEDED FOR ANY PURPOSE, NOTIFY LAB WITHIN 5 DAYS.  LOWEST DETECTABLE LIMITS FOR URINE DRUG SCREEN Drug Class                     Cutoff (ng/mL) Amphetamine and metabolites    1000 Barbiturate and metabolites    200 Benzodiazepine                 200 Tricyclics and metabolites     300 Opiates and metabolites        300 Cocaine and metabolites        300 THC                            50 Performed at [facility], [address], [place], [place] [address]     Current Facility-Administered Medications  Medication Dose Route Frequency Provider Last Rate Last Admin   hydrOXYzine (ATARAX) tablet 25 mg  25 mg Oral TID PRN  Dahlia Byes C, NP   25 mg at 08/30/21 0901   mirtazapine (REMERON) tablet 7.5 mg  7.5 mg Oral QHS Onuoha, Josephine C, NP   7.5 mg at 08/29/21 2113   OLANZapine zydis (ZYPREXA) disintegrating tablet 10 mg  10 mg Oral QHS Onuoha, Josephine C, NP   10 mg at 08/29/21 2113   temazepam (RESTORIL) capsule 15 mg  15 mg Oral QHS Onuoha, Josephine C, NP   15 mg at 08/29/21 2113   valbenazine (INGREZZA) capsule 40 mg  40 mg Oral Daily Dahlia Byes C, NP   40 mg at 08/30/21 0901   Current Outpatient Medications  Medication Sig Dispense Refill   hydrOXYzine (ATARAX) 25 MG tablet Take 1 tablet (25 mg total) by mouth 3 (three) times daily as needed for anxiety. (Patient not taking: Reported on 08/29/2021) 30 tablet 0   mirtazapine (REMERON) 7.5 MG tablet Take 1 tablet (7.5 mg total) by mouth at bedtime. (Patient not taking: Reported on 08/29/2021) 30 tablet 0   OLANZapine zydis (ZYPREXA) 15 MG disintegrating tablet Take 1 tablet (15 mg total) by mouth at bedtime. (Patient not taking: Reported on 08/29/2021) 30 tablet 0   temazepam (RESTORIL) 15 MG capsule Take  1 capsule (15 mg total) by mouth at bedtime. (Patient not taking: Reported on 08/29/2021) 30 capsule 0   valbenazine (INGREZZA) 40 MG capsule Take 1 capsule (40 mg total) by mouth daily. (Patient not taking: Reported on 08/29/2021) 30 capsule 0    Musculoskeletal: Strength & Muscle Tone: within normal limits Gait & Station: normal Patient leans: Front   Psychiatric Specialty Exam: Presentation  General Appearance: Disheveled  Eye Contact:Minimal  Speech:Other (comment) (patient not responding to assessment questions)  Speech Volume:Other (comment) (patient not responding to assessment questions)  Handedness:Right   Mood and Affect  Mood:Angry; Irritable  Affect:Flat   Thought Process  Thought Processes:Other (comment) (patient not responding to assessment questions)  Descriptions of Associations:Intact  Orientation:Other  (comment) (patient not responding to assessment questions)  Thought Content:Logical  History of Schizophrenia/Schizoaffective disorder:No  Duration of Psychotic Symptoms:Greater than six months  Hallucinations:Hallucinations: Auditory Description of Auditory Hallucinations: Unable to describe what he is hearing.  Ideas of Reference:None  Suicidal Thoughts:Suicidal Thoughts: No  Homicidal Thoughts:Homicidal Thoughts: No   Sensorium  Memory:Immediate Fair; Recent Fair; Remote Fair  Judgment:Poor  Insight:Fair   Executive Functions  Concentration:Fair  Attention Span:Fair  [place]   Psychomotor Activity  Psychomotor Activity:Psychomotor Activity: Normal Extrapyramidal Side Effects (EPS): Tardive Dyskinesia AIMS Completed?: No   Assets  Assets:Communication Skills; Housing; Social Support    Sleep  Sleep:Sleep: Fair   Physical Exam: Physical Exam Vitals and nursing note reviewed.  Constitutional:      General: He is not in acute distress.    Appearance: He is not ill-appearing, toxic-appearing or diaphoretic.  HENT:     Head:     Comments: Hydrocephalus shunt in the Brain  Cardiovascular:     Rate and Rhythm: Normal rate.  Pulmonary:     Effort: Pulmonary effort is normal.  Musculoskeletal:        General: Normal range of motion.     Cervical back: Normal range of motion.  Skin:    General: Skin is warm and dry.  Neurological:     Mental Status: He is alert.    Review of Systems  Unable to perform ROS: Psychiatric disorder  Neurological:        Tardive Dyskinesia  Psychiatric/Behavioral:         Paranoia, agitation towards dad   Blood pressure 132/69, pulse 83, temperature (!) 97.5 F (36.4 C), temperature source Oral, resp. rate 19, SpO2 98 %. There is no height or weight on file to calculate BMI.  Medical Decision Making: Patient meets criteria for inpatient Psychiatric hospitalization for  safety and stabilization.    Continue olanzapine 10 mg QHS for schizophrenia Continue Ingrezza 40 mg daily for TD   Problem 1: Schizophrenia-undifferentiated  Problem 2: Paranoia  Problem 3: Tardive Dyskinesia-Medication related.  Disposition:  Admit, seek bed placement.  Rozetta Nunnery, NP-PMHNP-BC 08/30/2021 1:05 PM

## 2021-08-30 NOTE — ED Notes (Signed)
Pt resting comfortable in bed at this time. All comfort and safety measures in place in room with bed in lowest position.   ?

## 2021-08-30 NOTE — ED Notes (Signed)
Received call from [facility] in regards to pt's behaviors over the last 24 hours.

## 2021-08-30 NOTE — BH Assessment (Signed)
[facility] Assessment Progress Note  Per Nira Conn, NP this pt continues to require psychiatric hospitalization at this time.  Pt presents under IVC initiated by EDP Norman Clay, MD.  [facility] will not be able to accommodate pt at this time.  The following facilities have been contacted to seek placement for this pt, with results as noted:  Beds available, information sent, decision pending:      08/29/21 referrals [facility]       08/30/21 referrals [facility]  Declined: [facility] (medical acuity)   At capacity: [facility]   Doylene Canning, [place] Behavioral Health Coordinator [phone]

## 2021-08-30 NOTE — ED Notes (Signed)
Per night shift pt vitals overdue. Will be completed by day shift once patient is awake to reduce stimulation.

## 2021-08-30 NOTE — ED Provider Notes (Signed)
Emergency Medicine Observation Re-evaluation Note  MATAEO INGWERSEN is a 41 y.o. male, seen on rounds today.  Pt initially presented to the ED for complaints of Mental Health Problem and IVC Currently, the patient is resting.  Physical Exam  BP (!) 116/91 (BP Location: Right Arm)   Pulse 86   Temp (!) 97.5 F (36.4 C) (Oral)   Resp 20   SpO2 97%  Physical Exam Pulmonary:     Effort: Pulmonary effort is normal.  Neurological:     Mental Status: He is alert.  Psychiatric:        Mood and Affect: Mood normal.      ED Course / MDM  EKG:   I have reviewed the labs performed to date as well as medications administered while in observation.  Recent changes in the last 24 hours include nothing.  Plan  Current plan is for inpt psych care.[name] is under involuntary commitment.      Virgina Norfolk, DO 08/30/21 [phone]

## 2021-08-30 NOTE — ED Notes (Signed)
Pt noted with increase agitation, wandering back and forth throughout department. MD notified at this time.

## 2021-08-30 NOTE — ED Notes (Signed)
Pt moved to [place] A bed.

## 2021-08-30 NOTE — ED Notes (Signed)
Pt keeps repeating bitch, bitch, f*#k you over and over, unable to keep in one location. MD notified.

## 2021-08-31 MED ORDER — OLANZAPINE 10 MG PO TBDP: 10.0000 mg | ORAL_TABLET | Freq: Every day | ORAL | 1 refills | Status: DC

## 2021-08-31 NOTE — Discharge Summary (Signed)
[facility] Psych ED Discharge  08/31/2021 2:01 PM [name]  MRN:  [id number]  Principal Problem: Undifferentiated schizophrenia [facility]) Discharge Diagnoses: Principal Problem:   Undifferentiated schizophrenia (HCC) Active Problems:   Tardive dyskinesia  Clinical Impression:  Final diagnoses:  Schizophrenia, unspecified type (HCC)   Subjective:  [name] is a 41 y.o. male patient admitted with hx of Schizophrenia and not taking medications, not eating and paranoid and agitated towards his dad.  Patient was hospitalized early this month at [facility] and since then he has been seen at in the ER twice. Pt has a history of schizophrenia, hydrocephalus SP shunt, and tardive dyskinesia.  Patient evaluated and case discussed with Dr. Nelly Rout. Patient states "I am feeling alright." He states that he is ready to go home. He denies suicidal ideations. Denies homicidal ideations. States "I don't want to hurt nobody." He denies feeling depressed/sad or anxious. He reports that he lives his Dad and brother.   On evaluation patient is alert and oriented, calm, and cooperative. Speech is garbled at times, but coherent. Mood is euthymic. Affect is flat.Thought process is coherent and thought content is logical. Denies auditory and visual hallucinations. No indication that patient is responding to internal stimuli. No delusions elicited during this assessment. Denies suicidal ideations. Denies homicidal ideations.   During this admission to the ED, the patient's home medications were continued. Olanzapine was decreased from 15 mg QHS to 10 mg QHS due to concern for TD. The following medications were continued without changes: mirtazapine 7.5 mg QHS for sleep/appetite/depression, temazepam 15 mg QHS for sleep, and ingrezza 40 mg daily for TD.   Discussed importance of taking medications as prescribed. Patient verbalizes understanding. Encouraged to follow up with outpatient providers as early as possible.    Attempted to contact the patient's father Shayn Madole [phone]) with his expressed consent to discuss discharge. The call went to a voice mail without an introductory message.    ED Assessment Time Calculation: Start Time: 1200 Stop Time: 1210 Total Time in Minutes (Assessment Completion): 10   Past Psychiatric History: Schizophrenia, Tardive Dyskinesia, Hydrocephalus SP Shunt, and multiple hospitalizations.  Recent inpatient Psychiatric hospitalization at [facility], Multiple ER visits as well.  Past Medical History:  Past Medical History:  Diagnosis Date   Schizophrenia [facility])     Past Surgical History:  Procedure Laterality Date   shunt surgery     Family History:  Family History  Problem Relation Age of Onset   Stroke Mother    Thyroid disease Father     Social History:  Social History   Substance and Sexual Activity  Alcohol Use Not Currently     Social History   Substance and Sexual Activity  Drug Use Not Currently   Types: Marijuana    Social History   Socioeconomic History   Marital status: Single    Spouse name: Not on file   Number of children: Not on file   Years of education: Not on file   Highest education level: Not on file  Occupational History   Not on file  Tobacco Use   Smoking status: Every Day    Types: Cigars   Smokeless tobacco: Never  Vaping Use   Vaping Use: Former  Substance and Sexual Activity   Alcohol use: Not Currently   Drug use: Not Currently    Types: Marijuana   Sexual activity: Not on file  Other Topics Concern   Not on file  Social History Narrative  Not on file   Social Determinants of Health   Financial Resource Strain: Not on file  Food Insecurity: Not on file  Transportation Needs: Not on file  Physical Activity: Not on file  Stress: Not on file  Social Connections: Not on file    Tobacco Cessation:  A prescription for an FDA-approved tobacco cessation medication was offered at discharge and the  patient refused  Current Medications: Current Facility-Administered Medications  Medication Dose Route Frequency Provider Last Rate Last Admin   hydrOXYzine (ATARAX) tablet 25 mg  25 mg Oral TID PRN Dahlia Byes C, NP   25 mg at 08/31/21 0929   mirtazapine (REMERON) tablet 7.5 mg  7.5 mg Oral QHS Onuoha, Josephine C, NP   7.5 mg at 08/30/21 2138   OLANZapine zydis (ZYPREXA) disintegrating tablet 10 mg  10 mg Oral QHS Onuoha, Josephine C, NP   10 mg at 08/30/21 2138   temazepam (RESTORIL) capsule 15 mg  15 mg Oral QHS Onuoha, Josephine C, NP   15 mg at 08/30/21 2138   valbenazine (INGREZZA) capsule 40 mg  40 mg Oral Daily Dahlia Byes C, NP   40 mg at 08/31/21 1829   Current Outpatient Medications  Medication Sig Dispense Refill   hydrOXYzine (ATARAX) 25 MG tablet Take 1 tablet (25 mg total) by mouth 3 (three) times daily as needed for anxiety. (Patient not taking: Reported on 08/29/2021) 30 tablet 0   mirtazapine (REMERON) 7.5 MG tablet Take 1 tablet (7.5 mg total) by mouth at bedtime. (Patient not taking: Reported on 08/29/2021) 30 tablet 0   OLANZapine zydis (ZYPREXA) 10 MG disintegrating tablet Take 1 tablet (10 mg total) by mouth at bedtime. 30 tablet 1   temazepam (RESTORIL) 15 MG capsule Take 1 capsule (15 mg total) by mouth at bedtime. (Patient not taking: Reported on 08/29/2021) 30 capsule 0   valbenazine (INGREZZA) 40 MG capsule Take 1 capsule (40 mg total) by mouth daily. (Patient not taking: Reported on 08/29/2021) 30 capsule 0   PTA Medications: (Not in a hospital admission)   [place] Scale:  Flowsheet Row ED from 08/28/2021 in [facility] Most recent reading at 08/28/2021  7:13 PM [facility] Most recent reading at 08/28/2021  6:22 PM ED to Hosp-Admission (Discharged) [facility] Most recent reading at 08/06/2021 12:53 PM  C-SSRS  RISK CATEGORY No Risk No Risk No Risk       Musculoskeletal: Strength & Muscle Tone: within normal limits Gait & Station: normal Patient leans: Front  Psychiatric Specialty Exam: Presentation  General Appearance: Fairly Groomed  Eye Contact:Fair  Speech:Clear and Coherent; Slow  Speech Volume:Normal  Handedness:Right   Mood and Affect  Mood:Euthymic  Affect:Flat   Thought Process  Thought Processes:Coherent  Descriptions of Associations:Intact  Orientation:Full (Time, Place and Person)  Thought Content:Logical  History of Schizophrenia/Schizoaffective disorder:No  Duration of Psychotic Symptoms:Greater than six months  Hallucinations:Hallucinations: None  Ideas of Reference:None  Suicidal Thoughts:Suicidal Thoughts: No  Homicidal Thoughts:Homicidal Thoughts: No   Sensorium  Memory:Immediate Good; Recent Fair  Judgment:Intact  Insight:Present   Executive Functions  Concentration:Fair  Attention Span:Fair  Recall:Fair  Fund of Knowledge:Fair  Language:Fair   Psychomotor Activity  Psychomotor Activity:Extrapyramidal Side Effects (EPS): Tardive Dyskinesia AIMS Completed?: No   Assets  Assets:Communication Skills; Housing; Social Support; Financial Resources/Insurance   Sleep  Sleep:Sleep: Fair    Physical Exam: Physical Exam Constitutional:  General: He is not in acute distress.    Appearance: He is not ill-appearing, toxic-appearing or diaphoretic.  HENT:     Right Ear: External ear normal.     Left Ear: External ear normal.  Eyes:     General:        Right eye: No discharge.        Left eye: No discharge.  Cardiovascular:     Rate and Rhythm: Normal rate.  Pulmonary:     Effort: Pulmonary effort is normal. No respiratory distress.  Musculoskeletal:        General: Normal range of motion.  Neurological:     Mental Status: He is alert and oriented to person, place, and time.  Psychiatric:        Thought Content:  Thought content is not paranoid or delusional. Thought content does not include homicidal or suicidal ideation.    Review of Systems  Constitutional:  Negative for chills, diaphoresis, fever, malaise/fatigue and weight loss.  Cardiovascular:  Negative for chest pain and palpitations.  Gastrointestinal:  Negative for diarrhea, nausea and vomiting.  Neurological:  Negative for dizziness and seizures.  Psychiatric/Behavioral:  Negative for depression, hallucinations, memory loss and suicidal ideas. The patient has insomnia. The patient is not nervous/anxious.    Blood pressure (!) 133/98, pulse 98, temperature 97.7 F (36.5 C), temperature source Oral, resp. rate 20, SpO2 93 %. There is no height or weight on file to calculate BMI.   Demographic Factors:  Male and Low socioeconomic status  Loss Factors: Financial problems/change in socioeconomic status  Historical Factors: Family history of mental illness or substance abuse  Risk Reduction Factors:   Religious beliefs about death, Living with another person, especially a relative, and Positive social support  Continued Clinical Symptoms:  Previous Psychiatric Diagnoses and Treatments Medical Diagnoses and Treatments/Surgeries  Cognitive Features That Contribute To Risk:  None    Suicide Risk:  Minimal: No identifiable suicidal ideation.  Patients presenting with no risk factors but with morbid ruminations; may be classified as minimal risk based on the severity of the depressive symptoms    Medical Decision Making: At time of discharge, patient denies SI, HI, AVH and is able to contract for safety. He demonstrated no overt evidence of psychosis or mania. Prior to discharge the patient verbalized that he understood warning signs, triggers, and symptoms of worsening mental health and how to access emergency mental health care if they felt it was needed. Patient was instructed to call 911 or return to the emergency room if they  experienced any concerning symptoms after discharge. Patient voiced understanding and agreed to this.    Discharge Instructions         ____________________________________  Discharge recommendations:  Patient is to take medications as prescribed. Please see information for follow-up appointment with psychiatry and therapy. Please follow up with your primary care provider for all medical related needs.   Therapy: We recommend that patient participate in individual therapy to address mental health concerns.  Medications: The patient/guardian is to contact a medical professional and/or outpatient provider to address any new side effects that develop. Patient/guardian should update outpatient providers of any new medications and/or medication changes.   Atypical antipsychotics: (olanzapine/zyprexa) If you are prescribed an atypical antipsychotic, it is recommended that your height, weight, BMI, blood pressure, fasting lipid panel, and fasting blood sugar be monitored by your outpatient providers.  Safety:  The patient should abstain from use of illicit substances/drugs and abuse of any medications.  If symptoms worsen or do not continue to improve or if the patient becomes actively suicidal or homicidal then it is recommended that the patient return to the closest hospital emergency department, the [facility], or call 911 for further evaluation and treatment. National Suicide Prevention Lifeline 1-800-SUICIDE or 562-749-9624.  About 988 988 offers 24/7 access to trained crisis counselors who can help people experiencing mental health-related distress. People can call or text 988 or chat 988lifeline.org for themselves or if they are worried about a loved one who may need crisis support.  Crisis Mobile: [facility]:                     702-830-8887 (for crisis response 24 hours a day) [facility] Hotline:                                             8583782578   For your mental health needs, you are advised to follow up with [facility] at your earliest opportunity:       [facility]      [address]      [place], [place] [address]      [phone]  If for any reason you are unable to follow up with [facility], contact [facility]:       [facility]      [address]      [place], [place] [address]      [phone]        Problem 1: Schizophrenia  Problem 2: Tardive dyskinesia   Disposition: No evidence of imminent risk to self or others at present.   Patient does not meet criteria for psychiatric inpatient admission. Supportive therapy provided about ongoing stressors. Discussed crisis plan, support from social network, calling 911, coming to the Emergency Department, and calling Suicide Hotline.   Jackelyn Poling, NP 08/31/2021, 2:01 PM

## 2021-08-31 NOTE — ED Notes (Signed)
Patient at desk asking about his pants.  Patient assured that his pants were locked up with his other belongings and they were secure.  Patient seemed to accept this explanation and sat back down on his bed.  Patient continues to fidget with hands and equipment around him but is redirectable.

## 2021-08-31 NOTE — ED Notes (Signed)
Patient sitting on side of bed, fidgeting with hands.  Patient pleasant and cooperative at this time.

## 2021-08-31 NOTE — ED Provider Notes (Addendum)
Emergency Medicine Observation Re-evaluation Note  [name] is a 41 y.o. male, seen on rounds today.  Pt initially presented to the ED for complaints of Mental Health Problem and IVC Currently, the patient is resting.  Physical Exam  BP (!) 108/97 (BP Location: Left Arm)   Pulse 90   Temp 97.8 F (36.6 C) (Oral)   Resp 19   SpO2 94%  Physical Exam General: Resting without agitation Cardiac: No murmur Lungs: Lungs clear on my auscultation this morning Psych: No agitation  ED Course / MDM  EKG:   I have reviewed the labs performed to date as well as medications administered while in observation.  Recent changes in the last 24 hours include none reported by nursing.  Plan  Current plan is for awaiting inpatient placement. [name] is under involuntary commitment.      [name], [name], MD 08/31/21 [phone]   Patient was cleared from a psychiatric standpoint and IVC was rescinded.  Patient be discharged per their plan.   [name], [name], MD 08/31/21 1149

## 2021-08-31 NOTE — ED Notes (Signed)
All belongings returned to patient and family.  Instructions reviewed as well as change in meds.  Advised to follow up with psych and therapy.

## 2021-08-31 NOTE — ED Notes (Signed)
IV rescinded per psych.  Patient ok to get dressed and is discharged.  Patient family at bedside.

## 2021-08-31 NOTE — BH Assessment (Addendum)
[facility] Assessment Progress Note   Per Nira Conn, NP, this pt does not require psychiatric hospitalization at this time.  Pt presents under IVC initiated by EDP Norman Clay, MD which has been rescinded by [facility].  Pt is psychiatrically cleared.  Discharge instructions advise pt to resume treatment with [facility], with [facility] as an alternative.  EDP Lynden Oxford, MD and pt's nurses, Esther Hardy and Sherrilyn Rist, have been notified.  Doylene Canning, MA Triage Specialist [phone]

## 2021-08-31 NOTE — Discharge Instructions (Addendum)
    ____________________________________  Discharge recommendations:  Patient is to take medications as prescribed. Please see information for follow-up appointment with psychiatry and therapy. Please follow up with your primary care provider for all medical related needs.   Therapy: We recommend that patient participate in individual therapy to address mental health concerns.  Medications: The patient/guardian is to contact a medical professional and/or outpatient provider to address any new side effects that develop. Patient/guardian should update outpatient providers of any new medications and/or medication changes.   Atypical antipsychotics: (olanzapine/zyprexa) If you are prescribed an atypical antipsychotic, it is recommended that your height, weight, BMI, blood pressure, fasting lipid panel, and fasting blood sugar be monitored by your outpatient providers.  Safety:  The patient should abstain from use of illicit substances/drugs and abuse of any medications. If symptoms worsen or do not continue to improve or if the patient becomes actively suicidal or homicidal then it is recommended that the patient return to the closest hospital emergency department, the [facility], or call 911 for further evaluation and treatment. National Suicide Prevention Lifeline 1-800-SUICIDE or 9022565319.  About 988 988 offers 24/7 access to trained crisis counselors who can help people experiencing mental health-related distress. People can call or text 988 or chat 988lifeline.org for themselves or if they are worried about a loved one who may need crisis support.  Crisis Mobile: [facility]:                     774-544-1490 (for crisis response 24 hours a day) [facility] Hotline:                                            747-388-5324   For your mental health needs, you are advised to follow up with [facility] at your earliest opportunity:        [facility]      [address]      [place], [place] [address]      [phone]  If for any reason you are unable to follow up with [facility], contact [facility]:       [facility]      [address]      [place], [place] [address]      [phone]

## 2021-10-13 ENCOUNTER — Emergency Department (HOSPITAL_COMMUNITY)
Admission: EM | Admit: 2021-10-13 | Discharge: 2021-10-17 | Disposition: A | Discharge status: Other Acute Inpatient Hospital | Payer: Medicaid Other | Attending: Emergency Medicine | Admitting: Emergency Medicine

## 2021-10-13 ENCOUNTER — Other Ambulatory Visit: Payer: Self-pay

## 2021-10-13 ENCOUNTER — Encounter (HOSPITAL_COMMUNITY): Payer: Self-pay

## 2021-10-13 DIAGNOSIS — G2401 Drug induced subacute dyskinesia: Secondary | ICD-10-CM | POA: Diagnosis present

## 2021-10-13 DIAGNOSIS — D649 Anemia, unspecified: Secondary | ICD-10-CM | POA: Insufficient documentation

## 2021-10-13 DIAGNOSIS — Z20822 Contact with and (suspected) exposure to covid-19: Secondary | ICD-10-CM | POA: Diagnosis not present

## 2021-10-13 DIAGNOSIS — E876 Hypokalemia: Secondary | ICD-10-CM | POA: Insufficient documentation

## 2021-10-13 DIAGNOSIS — F23 Brief psychotic disorder: Secondary | ICD-10-CM

## 2021-10-13 DIAGNOSIS — Z046 Encounter for general psychiatric examination, requested by authority: Secondary | ICD-10-CM | POA: Diagnosis present

## 2021-10-13 DIAGNOSIS — F200 Paranoid schizophrenia: Secondary | ICD-10-CM | POA: Diagnosis not present

## 2021-10-13 LAB — COMPREHENSIVE METABOLIC PANEL
ALT: 18 U/L (ref 0–44)
AST: 43 U/L — ABNORMAL HIGH (ref 15–41)
Albumin: 4.2 g/dL (ref 3.5–5.0)
Alkaline Phosphatase: 72 U/L (ref 38–126)
Anion gap: 9 (ref 5–15)
BUN: 14 mg/dL (ref 6–20)
CO2: 24 mmol/L (ref 22–32)
Calcium: 9 mg/dL (ref 8.9–10.3)
Chloride: 106 mmol/L (ref 98–111)
Creatinine, Ser: 0.92 mg/dL (ref 0.61–1.24)
GFR, Estimated: >60 mL/min (ref >60)
Glucose, Bld: 104 mg/dL — ABNORMAL HIGH (ref 70–99)
Potassium: 3.3 mmol/L — ABNORMAL LOW (ref 3.5–5.1)
Sodium: 139 mmol/L (ref 135–145)
Total Bilirubin: 0.7 mg/dL (ref 0.3–1.2)
Total Protein: 8.6 g/dL — ABNORMAL HIGH (ref 6.5–8.1)

## 2021-10-13 LAB — CBC
HCT: 36.1 % — ABNORMAL LOW (ref 39.0–52.0)
Hemoglobin: 12.3 g/dL — ABNORMAL LOW (ref 13.0–17.0)
MCH: 25.4 pg — ABNORMAL LOW (ref 26.0–34.0)
MCHC: 34.1 g/dL (ref 30.0–36.0)
MCV: 74.6 fL — ABNORMAL LOW (ref 80.0–100.0)
Platelets: 439 10*3/uL — ABNORMAL HIGH (ref 150–400)
RBC: 4.84 MIL/uL (ref 4.22–5.81)
RDW: 16.1 % — ABNORMAL HIGH (ref 11.5–15.5)
WBC: 9.9 10*3/uL (ref 4.0–10.5)
nRBC: 0 % (ref 0.0–0.2)

## 2021-10-13 LAB — ETHANOL: Alcohol, Ethyl (B): <10 mg/dL (ref <10)

## 2021-10-13 MED ORDER — HALOPERIDOL LACTATE 5 MG/ML IJ SOLN
INTRAMUSCULAR | Status: AC
  Administered 2021-10-13: 2 mg
  Filled 2021-10-13: qty 1

## 2021-10-13 MED ORDER — LORAZEPAM 2 MG/ML IJ SOLN
1.0000 mg | Freq: Once | INTRAMUSCULAR | Status: AC
  Administered 2021-10-13: 1 mg via INTRAMUSCULAR
  Filled 2021-10-13: qty 1

## 2021-10-13 NOTE — ED Triage Notes (Signed)
"  He was at the store trying to steal food, someone felt sorry for him and bought him food, he is just shaking and acting bizarre. His father said he has been missing for a while and is off of his medication. As of now he is voluntary and cooperative, but his father is at the magistrate to get IVC" per police

## 2021-10-13 NOTE — ED Notes (Signed)
Attempted to have patient change into burgundy scrubs. Pt would not respond. Pt took off scrub top and started shaking his head and clapping.

## 2021-10-13 NOTE — ED Provider Notes (Signed)
[facility]-EMERGENCY DEPT Provider Note   CSN: [id number] Arrival date & time: 10/13/21  6644     History  Chief Complaint  Patient presents with   Psychiatric Evaluation    [name] is a 41 y.o. male with Hx of paranoid schizophrenia presenting today due to an IVC filed by his father.  Has been off his medications since May 2023, and refuses to take them.  Found wandering through [place], speaking loudly to himself, flailing his arms, and extremely agitated.  History limited due to patient's mental status.  Appears paranoid, making incoherent sounds and noises and speaking to the air.  Repetitively tries to leave.  Answer some questions directly, however does not answer others.  Denies SI/HI.  The history is provided by the patient and medical records.      Home Medications Prior to Admission medications   Medication Sig Start Date End Date Taking? Authorizing Provider  hydrOXYzine (ATARAX) 25 MG tablet Take 1 tablet (25 mg total) by mouth 3 (three) times daily as needed for anxiety. Patient not taking: Reported on 08/29/2021 08/15/21   Sarita Bottom, MD  mirtazapine (REMERON) 7.5 MG tablet Take 1 tablet (7.5 mg total) by mouth at bedtime. Patient not taking: Reported on 08/29/2021 08/15/21   Sarita Bottom, MD  OLANZapine zydis (ZYPREXA) 10 MG disintegrating tablet Take 1 tablet (10 mg total) by mouth at bedtime. 08/31/21   Jackelyn Poling, NP  temazepam (RESTORIL) 15 MG capsule Take 1 capsule (15 mg total) by mouth at bedtime. Patient not taking: Reported on 08/29/2021 08/15/21   Sarita Bottom, MD  valbenazine [facility]) 40 MG capsule Take 1 capsule (40 mg total) by mouth daily. Patient not taking: Reported on 08/29/2021 08/15/21   Sarita Bottom, MD      Allergies    Risperdal [risperidone]    Review of Systems   Review of Systems  Psychiatric/Behavioral:  Positive for agitation and hallucinations.     Physical Exam Updated Vital Signs BP 120/82 (BP  Location: Right Arm)   Pulse 89   Temp 98 F (36.7 C) (Oral)   Resp 20   Ht 5\' 10"  (1.778 m)   Wt 63.5 kg   SpO2 95%   BMI 20.09 kg/m  Physical Exam Vitals and nursing note reviewed.  Constitutional:      General: He is not in acute distress.    Appearance: He is well-developed. He is not ill-appearing or toxic-appearing.  HENT:     Head: Normocephalic and atraumatic.  Eyes:     General: Lids are normal.     Conjunctiva/sclera: Conjunctivae normal.  Cardiovascular:     Rate and Rhythm: Normal rate and regular rhythm.     Heart sounds: No murmur heard. Pulmonary:     Effort: Pulmonary effort is normal. No respiratory distress.     Breath sounds: Normal breath sounds.  Abdominal:     Palpations: Abdomen is soft.     Tenderness: There is no abdominal tenderness.  Musculoskeletal:        General: No swelling.     Cervical back: Neck supple. No rigidity.     Comments: Constant jerking movements of the arms that slows when resting.  Skin:    General: Skin is warm and dry.     Capillary Refill: Capillary refill takes less than 2 seconds.  Neurological:     Mental Status: He is alert.     GCS: GCS eye subscore is 4. GCS verbal subscore is 5.  GCS motor subscore is 6.  Psychiatric:        Mood and Affect: Mood normal.        Behavior: Behavior is agitated and withdrawn.     Comments: Appears to be responding to internal stimuli.  Inattentive.  Eyes continually dart around the room, appears paranoid.  Speaking out to inanimate objects, at times shouting.  Speech at times delayed.  Flat affect.  Appears withdrawn and at times very agitated.    ED Results / Procedures / Treatments   Labs (all labs ordered are listed, but only abnormal results are displayed) Labs Reviewed  COMPREHENSIVE METABOLIC PANEL - Abnormal; Notable for the following components:      Result Value   Potassium 3.3 (*)    Glucose, Bld 104 (*)    Total Protein 8.6 (*)    AST 43 (*)    All other components  within normal limits  CBC - Abnormal; Notable for the following components:   Hemoglobin 12.3 (*)    HCT 36.1 (*)    MCV 74.6 (*)    MCH 25.4 (*)    RDW 16.1 (*)    Platelets 439 (*)    All other components within normal limits  RESP PANEL BY RT-PCR (FLU A&B, COVID) ARPGX2  ETHANOL  RAPID URINE DRUG SCREEN, HOSP PERFORMED  URINALYSIS, ROUTINE W REFLEX MICROSCOPIC    EKG None  Radiology No results found.  Procedures Procedures    Medications Ordered in ED Medications  haloperidol lactate (HALDOL) 5 MG/ML injection (2 mg  Given 10/13/21 1110)  LORazepam (ATIVAN) injection 1 mg (1 mg Intramuscular Given 10/13/21 1232)    ED Course/ Medical Decision Making/ A&P                           Medical Decision Making Amount and/or Complexity of Data Reviewed Labs: ordered.  Risk Prescription drug management.   41 y.o. male presents to the ED for concern of Psychiatric Evaluation     This involves an extensive number of treatment options, and is a complaint that carries with it a high risk of complications and morbidity.  The emergent differential diagnosis prior to evaluation includes, but is not limited to: Acute psychosis, electrolyte imbalance, dehydration, hyper or hypoglycemia  This is not an exhaustive differential.   Past Medical History / Co-morbidities / Social History: Hx of paranoid schizophrenia Social Determinants of Health include: Mental health needs, medication noncompliance  Additional History:  Obtained by chart review.  Notably recent ED visit from July 2023, for similar symptoms  Lab Tests: I ordered, and personally interpreted labs.  The pertinent results include:   H&H 12.3/36.1 mild anemia, stable Potassium 3.3, mild hypokalemia UA and UDS pending  Imaging Studies: None  ED Course / Critical Interventions: Pt appears acutely psychotic on exam.  Responding to internal stimuli..  Significantly agitated, attempted to leave several times and  swinging at hospital personnel per nursing report.  Haldol provided.  Still appears agitated and uncooperative.  Ativan provided.  Patient now resting comfortably, and more cooperative.  Mild bilateral ventricular hypertrophy on EKG, or without chest pain, dyspnea on exertion, shortness of breath, or other ACS suspicious symptoms.  No evidence of upper respiratory infection or respiratory distress.  Physical exam findings and overall labs unremarkable.   I have reviewed the patients home medicines and have made adjustments as needed. Upon re-evaluation, pt resting on bed.  Father now at bedside.  He  states he has requested guardianship for his son, and is in the process of completing this.  Requests long term antipsychotics, such as monthly injections, be considered for treatment, vs daily refusal of PO medication.   Disposition: Patient has been medically cleared for psychiatric evaluation.  Arrived with IVC paperwork.  I discussed this case with my attending, Dr. Dalene Seltzer, who agreed with the proposed treatment course and cosigned this note including patient's presenting symptoms, physical exam, and planned diagnostics and interventions.  Attending physician stated agreement with plan or made changes to plan which were implemented.     This chart was dictated using voice recognition software.  Despite best efforts to proofread, errors can occur which can change the documentation meaning.         Final Clinical Impression(s) / ED Diagnoses Final diagnoses:  Acute psychosis (HCC)  Paranoid schizophrenia [facility])    Rx / DC Orders ED Discharge Orders     None         Sandrea Hammond 10/16/21 1004    Alvira Monday, MD 10/19/21 1319

## 2021-10-13 NOTE — ED Notes (Signed)
Unable to update vitals due to behavior.

## 2021-10-13 NOTE — ED Notes (Signed)
Pt keep trying to run, officers are at bedside trying to keep him in bed/ stop him from running.

## 2021-10-14 DIAGNOSIS — F200 Paranoid schizophrenia: Secondary | ICD-10-CM

## 2021-10-14 LAB — RAPID URINE DRUG SCREEN, HOSP PERFORMED
Amphetamines: NOT DETECTED
Barbiturates: NOT DETECTED
Benzodiazepines: POSITIVE — AB
Cocaine: NOT DETECTED
Opiates: NOT DETECTED
Tetrahydrocannabinol: NOT DETECTED

## 2021-10-14 LAB — URINALYSIS, ROUTINE W REFLEX MICROSCOPIC
Bilirubin Urine: NEGATIVE
Glucose, UA: NEGATIVE mg/dL
Hgb urine dipstick: NEGATIVE
Ketones, ur: NEGATIVE mg/dL
Leukocytes,Ua: NEGATIVE
Nitrite: NEGATIVE
Protein, ur: NEGATIVE mg/dL
Specific Gravity, Urine: 1.024 (ref 1.005–1.030)
pH: 6 (ref 5.0–8.0)

## 2021-10-14 LAB — RESP PANEL BY RT-PCR (FLU A&B, COVID) ARPGX2
Influenza A by PCR: NEGATIVE
Influenza B by PCR: NEGATIVE
SARS Coronavirus 2 by RT PCR: NEGATIVE

## 2021-10-14 MED ORDER — LORAZEPAM 1 MG PO TABS: 1.0000 mg | ORAL_TABLET | Freq: Four times a day (QID) | ORAL | Status: DC | PRN

## 2021-10-14 MED ORDER — LORAZEPAM 2 MG/ML IJ SOLN: 2.0000 mg | Freq: Four times a day (QID) | INTRAMUSCULAR | Status: DC | PRN

## 2021-10-14 MED ORDER — OLANZAPINE 10 MG PO TBDP
10.0000 mg | ORAL_TABLET | Freq: Every day | ORAL | Status: DC
  Administered 2021-10-14 – 2021-10-16 (×3): 10 mg via ORAL
  Filled 2021-10-14 (×3): qty 1

## 2021-10-14 MED ORDER — VALBENAZINE TOSYLATE 40 MG PO CAPS
40.0000 mg | ORAL_CAPSULE | Freq: Every day | ORAL | Status: DC
  Administered 2021-10-14 – 2021-10-17 (×3): 40 mg via ORAL
  Filled 2021-10-14 (×5): qty 1

## 2021-10-14 MED ORDER — LORAZEPAM 1 MG PO TABS
1.0000 mg | ORAL_TABLET | Freq: Once | ORAL | Status: AC
  Administered 2021-10-14: 1 mg via ORAL
  Filled 2021-10-14: qty 1

## 2021-10-14 NOTE — ED Notes (Signed)
Patient has IVC  petitioned by his father,and brought in by police, [employer], dated 10/13/21.  First Exam completed by Dr. Casimer Lanius 10/13/21. This exam expires on 10/20/21.

## 2021-10-14 NOTE — Progress Notes (Signed)
Per Nira Conn, NP, patient meets criteria for inpatient treatment. There are no available beds at [facility] today. CSW faxed referrals to the following facilities for review:  [facility]  Pending - No Request Sent N/A [address], [place] [address] [phone] --  [facility]  Pending - No Request Sent N/A [address], [place] [address] [phone] --  [facility]  Pending - No Request [address] [name] [address] [phone] --  [facility]  Pending - No Request Sent N/A [address] [place] [address] [phone] --  [facility]  Pending - No Request Sent N/A [address] [place], [place] [address] [phone] --  [facility]  Pending - No Request Sent N/A [address] [place] [address] [phone] --  [facility]  Pending - No Request Sent N/A [address] [place] [address] [phone] --  [facility]  Pending - No Request Sent N/A [address] [place] [address] [phone] --  [facility]  Pending - No Request Sent N/A [address] [place] [address] [phone] --  [facility]  Pending - No Request Sent N/A [address] [place] [address] [phone] --  [facility]  Pending - No Request Sent N/A [name] [place] [address] [phone] --  [facility]  Pending - No Request Sent N/A [address] [place] [address] [phone] --  [facility]  Pending - No Request Sent N/A [address] [place] [address] [phone] --   TTS will continue to  seek bed placement.  Crissie Reese, MSW, Lenice Pressman Phone: [phone] Disposition/TOC

## 2021-10-14 NOTE — ED Notes (Signed)
Pt requesting something to eat. Pt given graham crackers, peanut butter and a sprite.

## 2021-10-14 NOTE — ED Notes (Signed)
After obtaining covid swab, pt walked to bathroom. Provided pt with specimen cup and requested urine sample. Pt filled container with water from sink. Still need to obtain urine sample.

## 2021-10-14 NOTE — Consult Note (Addendum)
BH ED ASSESSMENT   Reason for Consult:  IVC Referring Physician:  Alvira Monday, MD Patient Identification: [name] MRN:  [id number] ED Chief Complaint: Paranoid schizophrenia [facility])  Diagnosis:  Principal Problem:   Paranoid schizophrenia (HCC) Active Problems:   Tardive dyskinesia   ED Assessment Time Calculation: Start Time: 1000 Stop Time: 1030 Total Time in Minutes (Assessment Completion): 30   Subjective:   [name] is a 41 y.o. male patient with a history of paranoid schizophrenia, tardive dyskinsia, hydrocephalus SP shunt who presented to [facility] on 10/13/2021 under IVC. Patient was petitioned for IVC by his father, Melvin Whiteford [phone]. Per IVC "-paranoid schizophrenic-has not taken meds since May and refuses to take it-wandering around [employer] talking to himself, talking loud and swinging at the air-extremely agitated"  HPI:   [name]. Maser, a 41 year old male patient, was admitted with a history of Schizophrenia and a recurrent non-compliance with medication. His admission followed an incident at [facility] where he was attempting to steal food, leading to the police bringing him to [facility]. The patient's father reported that [name] had been missing for a period and had not been taking his prescribed medications. During the examination today, [name] displays a calm and cooperative demeanor and reports his mood as euthymic. Affect is mostly blunted, but he does display some appropriate laughter with this provider. Notably, there are some involuntary hand movements observed during the assessment. Involuntary movements have improved since this provider's last interaction with him, most likely due to not taking medications for several weeks. Donato denies the use of substances such as THC, cocaine, methamphetamines, alcohol, or any other substances. UDS is pending collection. No history of substance abuse noted on chart review. He also denied experiencing auditory and visual  hallucinations and did not exhibit signs of responding to internal stimuli. Laron reports having good sleep and appetite and denies suicidal and homicidal ideations.  Nursing staff report that [name] has been restless at times, pacing, and has attempted to elope, but no aggressive behavior.   On evaluation patient is alert and oriented x 4, pleasant, and cooperative. Speech is difficult to understand at times, but mostly clear and coherent during this assessment. Mood is euthymic. Affect is blunt. Thought process is coherent and thought content is logical. Denies auditory and visual hallucinations. No indication that patient is responding to internal stimuli. Denies paranoia. No delusions elicited during this assessment. Denies suicidal ideations. Denies homicidal ideations. Denies substance abuse.    Patient has had several visits to [facility] with similar presentations. He was last inpatient at [facility] 08/04/2021-08/15/2021. He was discharged on olanzapine 15 mg QHS, mirtazipine 7.5 mg QHS, temazepam 15 mg QHS, and Ingrezza 40 mg daily.   Past Psychiatric History: Schizophrenia, Tardive Dyskinesia, Hydrocephalus SP Shunt, and multiple hospitalizations.  Risk to Self or Others: Is the patient at risk to self? No Has the patient been a risk to self in the past 6 months? No Has the patient been a risk to self within the distant past? No Is the patient a risk to others? No Has the patient been a risk to others in the past 6 months? Yes Has the patient been a risk to others within the distant past? No  [place] Scale:  Flowsheet Row ED from 10/13/2021 in [facility] Most recent reading at 10/13/2021  9:40 AM ED from 08/28/2021 in [facility] Most recent reading at 08/28/2021  7:13 PM [facility] Most recent reading at 08/28/2021  6:22 PM  C-SSRS RISK CATEGORY No Risk No Risk No  Risk       AIMS:  , , ,  ,   ASAM:    Substance Abuse:  Alcohol / Drug Use History of alcohol / drug use?: No history of alcohol / drug abuse  Past Medical History:  Past Medical History:  Diagnosis Date   Schizophrenia (HCC)     Past Surgical History:  Procedure Laterality Date   shunt surgery     Family History:  Family History  Problem Relation Age of Onset   Stroke Mother    Thyroid disease Father     Social History:  Social History   Substance and Sexual Activity  Alcohol Use Not Currently     Social History   Substance and Sexual Activity  Drug Use Not Currently   Types: Marijuana    Social History   Socioeconomic History   Marital status: Single    Spouse name: Not on file   Number of children: Not on file   Years of education: Not on file   Highest education level: Not on file  Occupational History   Not on file  Tobacco Use   Smoking status: Every Day    Types: Cigars   Smokeless tobacco: Never  Vaping Use   Vaping Use: Former  Substance and Sexual Activity   Alcohol use: Not Currently   Drug use: Not Currently    Types: Marijuana   Sexual activity: Not on file  Other Topics Concern   Not on file  Social History Narrative   Not on file   Social Determinants of Health   Financial Resource Strain: Not on file  Food Insecurity: Not on file  Transportation Needs: Not on file  Physical Activity: Not on file  Stress: Not on file  Social Connections: Not on file   Additional Social History:    Allergies:   Allergies  Allergen Reactions   Hydroxyzine Other (See Comments)    Made the patient "too lethargic" (also may have caused Tardive dyskinesia)   Mirtazapine Other (See Comments)    Made the patient "too lethargic" (also may have caused Tardive dyskinesia)   Olanzapine Other (See Comments)    Made the patient "too lethargic" (also may have caused Tardive dyskinesia)   Risperdal [Risperidone] Other (See Comments)    Tardive  dyskinesia   Temazepam Other (See Comments)    Made the patient "too lethargic" (also may have caused Tardive dyskinesia)    Labs:  Results for orders placed or performed during the hospital encounter of 10/13/21 (from the past 48 hour(s))  Resp Panel by RT-PCR (Flu A&B, Covid) Anterior Nasal Swab     Status: None   Collection Time: 10/13/21  6:50 AM   Specimen: Anterior Nasal Swab  Result Value Ref Range   SARS Coronavirus 2 by RT PCR NEGATIVE NEGATIVE    Comment: (NOTE) SARS-CoV-2 target nucleic acids are NOT DETECTED.  The SARS-CoV-2 RNA is generally detectable in upper respiratory specimens during the acute phase of infection. The lowest concentration of SARS-CoV-2 viral copies this assay can detect is 138 copies/mL. A negative result does not preclude SARS-Cov-2 infection and should not be used as the sole basis for treatment or other patient management decisions. A negative result may occur with  improper specimen collection/handling, submission of specimen other than nasopharyngeal swab, presence of viral mutation(s) within the areas targeted by this assay,  and inadequate number of viral copies(<138 copies/mL). A negative result must be combined with clinical observations, patient history, and epidemiological information. The expected result is Negative.  Fact Sheet for Patients:  BloggerCourse.com  Fact Sheet for Healthcare Providers:  SeriousBroker.it  This test is no t yet approved or cleared by the [place] FDA and  has been authorized for detection and/or diagnosis of SARS-CoV-2 by FDA under an Emergency Use Authorization (EUA). This EUA will remain  in effect (meaning this test can be used) for the duration of the COVID-19 declaration under Section 564(b)(1) of the Act, 21 U.S.C.section 360bbb-3(b)(1), unless the authorization is terminated  or revoked sooner.       Influenza A by PCR NEGATIVE NEGATIVE    Influenza B by PCR NEGATIVE NEGATIVE    Comment: (NOTE) The Xpert Xpress SARS-CoV-2/FLU/RSV plus assay is intended as an aid in the diagnosis of influenza from Nasopharyngeal swab specimens and should not be used as a sole basis for treatment. Nasal washings and aspirates are unacceptable for Xpert Xpress SARS-CoV-2/FLU/RSV testing.  Fact Sheet for Patients: BloggerCourse.com  Fact Sheet for Healthcare Providers: SeriousBroker.it  This test is not yet approved or cleared by the [place] FDA and has been authorized for detection and/or diagnosis of SARS-CoV-2 by FDA under an Emergency Use Authorization (EUA). This EUA will remain in effect (meaning this test can be used) for the duration of the COVID-19 declaration under Section 564(b)(1) of the Act, 21 U.S.C. section 360bbb-3(b)(1), unless the authorization is terminated or revoked.  Performed at [facility], [address], [place], [place] [address]   Comprehensive metabolic panel     Status: Abnormal   Collection Time: 10/13/21  1:34 PM  Result Value Ref Range   Sodium 139 135 - 145 mmol/L   Potassium 3.3 (L) 3.5 - 5.1 mmol/L   Chloride 106 98 - 111 mmol/L   CO2 24 22 - 32 mmol/L   Glucose, Bld 104 (H) 70 - 99 mg/dL    Comment: Glucose reference range applies only to samples taken after fasting for at least 8 hours.   BUN 14 6 - 20 mg/dL   Creatinine, Ser 0.93 0.61 - 1.24 mg/dL   Calcium 9.0 8.9 - 23.5 mg/dL   Total Protein 8.6 (H) 6.5 - 8.1 g/dL   Albumin 4.2 3.5 - 5.0 g/dL   AST 43 (H) 15 - 41 U/L   ALT 18 0 - 44 U/L   Alkaline Phosphatase 72 38 - 126 U/L   Total Bilirubin 0.7 0.3 - 1.2 mg/dL   GFR, Estimated >57 >32 mL/min    Comment: (NOTE) Calculated using the CKD-EPI Creatinine Equation (2021)    Anion gap 9 5 - 15    Comment: Performed at [facility], [address], [place], [place] [address]  cbc     Status:  Abnormal   Collection Time: 10/13/21  1:34 PM  Result Value Ref Range   WBC 9.9 4.0 - 10.5 K/uL   RBC 4.84 4.22 - 5.81 MIL/uL   Hemoglobin 12.3 (L) 13.0 - 17.0 g/dL   HCT 27.0 (L) 62.3 - 76.2 %   MCV 74.6 (L) 80.0 - 100.0 fL   MCH 25.4 (L) 26.0 - 34.0 pg   MCHC 34.1 30.0 - 36.0 g/dL   RDW 83.1 (H) 51.7 - 61.6 %   Platelets 439 (H) 150 - 400 K/uL   nRBC 0.0 0.0 - 0.2 %    Comment: Performed at [facility], [address] [place], [place] [address]  Ethanol     Status: None   Collection Time: 10/13/21  2:14 PM  Result Value Ref Range   Alcohol, Ethyl (B) <10 <10 mg/dL    Comment: (NOTE) Lowest detectable limit for serum alcohol is 10 mg/dL.  For medical purposes only. Performed at [facility], [address], [place], [place] [address]     Current Facility-Administered Medications  Medication Dose Route Frequency Provider Last Rate Last Admin   LORazepam (ATIVAN) tablet 1 mg  1 mg Oral Once Jackelyn Poling, NP       OLANZapine zydis (ZYPREXA) disintegrating tablet 10 mg  10 mg Oral QHS Jackelyn Poling, NP       valbenazine (INGREZZA) capsule 40 mg  40 mg Oral Daily Jackelyn Poling, NP       Current Outpatient Medications  Medication Sig Dispense Refill   hydrOXYzine (ATARAX) 25 MG tablet Take 1 tablet (25 mg total) by mouth 3 (three) times daily as needed for anxiety. (Patient not taking: Reported on 10/13/2021) 30 tablet 0   mirtazapine (REMERON) 7.5 MG tablet Take 1 tablet (7.5 mg total) by mouth at bedtime. (Patient not taking: Reported on 10/13/2021) 30 tablet 0   OLANZapine zydis (ZYPREXA) 10 MG disintegrating tablet Take 1 tablet (10 mg total) by mouth at bedtime. (Patient not taking: Reported on 10/13/2021) 30 tablet 1   temazepam (RESTORIL) 15 MG capsule Take 1 capsule (15 mg total) by mouth at bedtime. (Patient not taking: Reported on 10/13/2021) 30 capsule 0   valbenazine (INGREZZA) 40 MG capsule Take 1 capsule (40 mg total) by mouth daily.  (Patient not taking: Reported on 10/13/2021) 30 capsule 0    Musculoskeletal: Strength & Muscle Tone: within normal limits Gait & Station: normal Patient leans: N/A   Psychiatric Specialty Exam: Presentation  General Appearance: Appropriate for Environment  Eye Contact:Good  Speech:Garbled  Speech Volume:Normal  Handedness:Right   Mood and Affect  Mood:Euthymic  Affect:Blunt   Thought Process  Thought Processes:Coherent  Descriptions of Associations:Intact  Orientation:Full (Time, Place and Person)  Thought Content:Logical  History of Schizophrenia/Schizoaffective disorder:Yes  Duration of Psychotic Symptoms:Greater than six months  Hallucinations:Hallucinations: None  Ideas of Reference:None  Suicidal Thoughts:Suicidal Thoughts: No  Homicidal Thoughts:Homicidal Thoughts: No   Sensorium  Memory:Immediate Good; Recent Fair; Remote Fair  Judgment:Impaired  Insight:Lacking   Executive Functions  Concentration:Good  Attention Span:Good  Recall:Fair  Fund of Knowledge:Fair  Language:Poor   Psychomotor Activity  Psychomotor Activity:Psychomotor Activity: Restlessness Extrapyramidal Side Effects (EPS): Tardive Dyskinesia   Assets  Assets:Housing; Financial Resources/Insurance; Social Support    Sleep  Sleep:Sleep: Good   Physical Exam: Physical Exam Constitutional:      General: He is not in acute distress.    Appearance: He is not ill-appearing, toxic-appearing or diaphoretic.  Eyes:     General:        Right eye: No discharge.        Left eye: No discharge.  Cardiovascular:     Rate and Rhythm: Normal rate.  Pulmonary:     Effort: Pulmonary effort is normal. No respiratory distress.  Neurological:     Mental Status: He is alert and oriented to person, place, and time.  Psychiatric:        Thought Content: Thought content does not include homicidal or suicidal ideation.    Review of Systems  Respiratory:  Negative for  cough and shortness of breath.  Cardiovascular:  Negative for chest pain.  Gastrointestinal:  Negative for diarrhea, nausea and vomiting.  Psychiatric/Behavioral:  Negative for substance abuse and suicidal ideas. The patient has insomnia.     Blood pressure 114/77, pulse 85, temperature 98.3 F (36.8 C), temperature source Oral, resp. rate 18, height 5\' 10"  (1.778 m), weight 63.5 kg, SpO2 96 %. Body mass index is 20.09 kg/m.  Medical Decision Making: Veverly FellsMichael D. Crawford GivensDowd, a 41 year old male patient, was admitted with a history of Schizophrenia and a recurrent non-compliance with medication. [facility] where he was attempting to steal food, leading to the [facility]. The patient's father reported that Casimiro NeedleMichael had been missing for a period and had not been taking his prescribed medications. During the examination today, Casimiro NeedleMichael displays a calm and cooperative demeanor and reports his mood as euthymic. Affect is mostly blunted, but he does display some appropriate laughter with this provider. Notably, there are some involuntary hand movements observed during the assessment. Involuntary movements have improved since this provider's last interaction with him, most likely due to not taking medications for several weeks. Casimiro NeedleMichael denies the use of substances such as THC, cocaine, methamphetamines, alcohol, or any other substances. UDS is pending collection. No history of substance abuse noted on chart review. He also denied experiencing auditory and visual hallucinations and did not exhibit signs of responding to internal stimuli. Casimiro NeedleMichael reported having good sleep and appetite and denied suicidal and homicidal ideations.   Considering the contents of the IVC and the patient's history of non compliance with medications we are recommending inpatient treatment for stabilization.   Patient reports that olanzapine and other antipsychotic he has taken worsened TD.  Patient was most recently prescribed olanzapine 15 mg QHS for schizophrenia.  Restart olanzapine (zydis) at 10 mg QHS for schizophrenia Restart ingrezza 40 mg daily for TD.    Problem 1: Schizophrenia  Problem 2: Tardive Dyskinesia   Disposition: Recommend psychiatric Inpatient admission when medically cleared.  Jackelyn PolingJason A Sarrah Fiorenza, NP 10/14/2021 12:01 PM

## 2021-10-14 NOTE — ED Notes (Signed)
Pt has made multiple attempts this morning to leave via nearby exit but is redirectable at this time. Pt is wearing burgundy scrub top but unwilling to change out of jeans from personal clothing. RN made aware and agrees to let pt keep jeans on for now to avoid escalation as pt is otherwise safe and all other belongings are being held at nurses station.

## 2021-10-15 NOTE — ED Provider Notes (Signed)
Emergency Medicine Observation Re-evaluation Note  [name] is a 41 y.o. male with history of schizophrenia, seen on rounds today.  Pt initially presented to the ED for complaints of Psychiatric Evaluation Currently, the patient is walking around the halls, no acute complaints.  Physical Exam  BP 113/70 (BP Location: Left Arm)   Pulse 69   Temp 97.9 F (36.6 C) (Oral)   Resp 16   Ht 5\' 10"  (1.778 m)   Wt 63.5 kg   SpO2 96%   BMI 20.09 kg/m  Physical Exam General: Walking around the halls, sitting on the stretcher Cardiac: Well-perfused Lungs: No increased work of breathing Psych: Restless but redirectable and cooperative  ED Course / MDM  EKG:EKG Interpretation  Date/Time:  Saturday October 13 2021 13:46:54 EDT Ventricular Rate:  94 PR Interval:  126 QRS Duration: 92 QT Interval:  374 QTC Calculation: 467 R Axis:   96 Text Interpretation: Normal sinus rhythm Rightward axis Biventricular hypertrophy Abnormal ECG When compared with ECG of 10-Aug-2021 07:59, No significant change since last tracing Confirmed by 12-Aug-2021 [phone]) on 10/14/2021 6:22:32 PM  I have reviewed the labs performed to date as well as medications administered while in observation.  Recent changes in the last 24 hours include none.  Plan  Current plan is for inpatient placement.    12/14/2021, MD 10/15/21 1419

## 2021-10-15 NOTE — Consult Note (Incomplete)
[facility] Face-to-Face Psychiatry Consult   Reason for Consult:  Psych Consult Referring Physician:   Patient Identification: [name] MRN:  [id number] Principal Diagnosis: Paranoid schizophrenia (HCC) Diagnosis:  Principal Problem:   Paranoid schizophrenia (HCC) Active Problems:   Tardive dyskinesia   Total Time spent with patient: 45 minutes  Subjective:   [name] is a 41 y.o. male patient.  HPI:  [name], a 41 year old male patient, was admitted with a history of Schizophrenia and a recurrent non-compliance with medication. His admission followed an incident at [facility] where he was attempting to steal food, leading to the police bringing him to [facility]. The patient's father reported that [name] had been missing for a period and had not been taking his prescribed medications. During the examination today, [name] displays a calm and cooperative demeanor and reports his mood as euthymic. Affect is mostly blunted, but he does display some appropriate laughter with this provider. Notably, there are some involuntary hand movements observed during the assessment. Involuntary movements have improved since this provider's last interaction with him, most likely due to not taking medications for several weeks. [name] denies the use of substances such as THC, cocaine, methamphetamines, alcohol, or any other substances. UDS is pending collection. No history of substance abuse noted on chart review. He also denied experiencing auditory and visual hallucinations and did not exhibit signs of responding to internal stimuli. [name] reports having good sleep and appetite and denies suicidal and homicidal ideations.  Assessment: On assessment today via telepsych, patient is seen and examined sitting up on a bed.  Patient appeared guarded and confused.  Chart reviewed and findings shared with the treatment team and discussed with Dr. Bluford Main via this notes.  Alert and oriented to name, date of  birth, place, however, not situation.  When asked what brought patient to the hospital, reported that his blood sugar was high, he was overheated and he does not know what happened next.  Then his dad brought him to the hospital.  Able to maintain good eye contact however, speech is garbled.  Thought process coherent with thought content illogical.  Sensorium with memory fair, judgment impaired, and insight shallow.  Patient denies suicidal ideation, homicidal ideation, auditory/visual hallucinations.  Based on patient presentation at admission, above report from patient cannot be fully determined.  Presents with paranoia stating, "I cannot tell you everything." Reports sleeping well throughout the night and having good appetite. Reported being safe at home and denies access to firearms.  Denies self-injurious behavior, denies alcohol use, denies drug use, however urine drug screen is positive for benzodiazepine's, and denies family history of mental illness. Reports being followed by a therapist/psychiatrist from [facility] therapy.  Abnormal labs as of 10/13/2021 reviewed, CMP: Potassium potassium 3.3(L), glucose 104, AST 43, total protein 8.6; CBC with differential: Hemoglobin 12.3, HCT 36.1, MCV 74.6, MCH 25.4, RDW 16.1, and platelets high at 439.  Disposition: Patient is not psych cleared and continues to meet the requirement for inpatient psychiatric admission.  [facility] treatment team and [facility] physician made aware.  Past Psychiatric History: Paranoid schizophrenia, tardive dyskinesia, and undifferentiated schizophrenia, Hydrocephalus SP Shunt, and multiple hospitalizations.    Risk to Self:  Yes Risk to Others:  Yes Prior Inpatient Therapy:  Yes Prior Outpatient Therapy:  Yes  Past Medical History:  Past Medical History:  Diagnosis Date   Schizophrenia [facility])     Past Surgical History:  Procedure Laterality Date   shunt surgery  Family History:   Family History  Problem Relation Age of Onset   Stroke Mother    Thyroid disease Father    Family Psychiatric  History: Not indicated Social History:  Social History   Substance and Sexual Activity  Alcohol Use Not Currently     Social History   Substance and Sexual Activity  Drug Use Not Currently   Types: Marijuana    Social History   Socioeconomic History   Marital status: Single    Spouse name: Not on file   Number of children: Not on file   Years of education: Not on file   Highest education level: Not on file  Occupational History   Not on file  Tobacco Use   Smoking status: Every Day    Types: Cigars   Smokeless tobacco: Never  Vaping Use   Vaping Use: Former  Substance and Sexual Activity   Alcohol use: Not Currently   Drug use: Not Currently    Types: Marijuana   Sexual activity: Not on file  Other Topics Concern   Not on file  Social History Narrative   Not on file   Social Determinants of Health   Financial Resource Strain: Not on file  Food Insecurity: Not on file  Transportation Needs: Not on file  Physical Activity: Not on file  Stress: Not on file  Social Connections: Not on file   Additional Social History:    Allergies:   Allergies  Allergen Reactions   Hydroxyzine Other (See Comments)    Made the patient "too lethargic" (also may have caused Tardive dyskinesia)   Mirtazapine Other (See Comments)    Made the patient "too lethargic" (also may have caused Tardive dyskinesia)   Olanzapine Other (See Comments)    Made the patient "too lethargic" (also may have caused Tardive dyskinesia)   Risperdal [Risperidone] Other (See Comments)    Tardive dyskinesia   Temazepam Other (See Comments)    Made the patient "too lethargic" (also may have caused Tardive dyskinesia)    Labs:  Results for orders placed or performed during the hospital encounter of 10/13/21 (from the past 48 hour(s))  Rapid urine drug screen (hospital performed)      Status: Abnormal   Collection Time: 10/13/21  9:59 PM  Result Value Ref Range   Opiates NONE DETECTED NONE DETECTED   Cocaine NONE DETECTED NONE DETECTED   Benzodiazepines POSITIVE (A) NONE DETECTED   Amphetamines NONE DETECTED NONE DETECTED   Tetrahydrocannabinol NONE DETECTED NONE DETECTED   Barbiturates NONE DETECTED NONE DETECTED    Comment: (NOTE) DRUG SCREEN FOR MEDICAL PURPOSES ONLY.  IF CONFIRMATION IS NEEDED FOR ANY PURPOSE, NOTIFY LAB WITHIN 5 DAYS.  LOWEST DETECTABLE LIMITS FOR URINE DRUG SCREEN Drug Class                     Cutoff (ng/mL) Amphetamine and metabolites    1000 Barbiturate and metabolites    200 Benzodiazepine                 200 Tricyclics and metabolites     300 Opiates and metabolites        300 Cocaine and metabolites        300 THC                            50 Performed at [facility] [place] [facility], [address], [place], [place] [address]   Urinalysis,  Routine w reflex microscopic Urine, Clean Catch     Status: None   Collection Time: 10/13/21  9:59 PM  Result Value Ref Range   Color, Urine YELLOW YELLOW   APPearance CLEAR CLEAR   Specific Gravity, Urine 1.024 1.005 - 1.030   pH 6.0 5.0 - 8.0   Glucose, UA NEGATIVE NEGATIVE mg/dL   Hgb urine dipstick NEGATIVE NEGATIVE   Bilirubin Urine NEGATIVE NEGATIVE   Ketones, ur NEGATIVE NEGATIVE mg/dL   Protein, ur NEGATIVE NEGATIVE mg/dL   Nitrite NEGATIVE NEGATIVE   Leukocytes,Ua NEGATIVE NEGATIVE    Comment: Performed at [facility], [place] [address] [place], [place] [address]    Current Facility-Administered Medications  Medication Dose Route Frequency Provider Last Rate Last Admin   LORazepam (ATIVAN) injection 2 mg  2 mg Intramuscular Q6H PRN Lindon Romp A, NP       Or   LORazepam (ATIVAN) tablet 1 mg  1 mg Oral Q6H PRN Rozetta Nunnery, NP       OLANZapine zydis (ZYPREXA) disintegrating tablet 10 mg  10 mg Oral QHS Lindon Romp A, NP   10 mg at 10/15/21 2106    valbenazine (INGREZZA) capsule 40 mg  40 mg Oral Daily Lindon Romp A, NP   40 mg at 10/14/21 1522   Current Outpatient Medications  Medication Sig Dispense Refill   hydrOXYzine (ATARAX) 25 MG tablet Take 1 tablet (25 mg total) by mouth 3 (three) times daily as needed for anxiety. (Patient not taking: Reported on 10/13/2021) 30 tablet 0   mirtazapine (REMERON) 7.5 MG tablet Take 1 tablet (7.5 mg total) by mouth at bedtime. (Patient not taking: Reported on 10/13/2021) 30 tablet 0   OLANZapine zydis (ZYPREXA) 10 MG disintegrating tablet Take 1 tablet (10 mg total) by mouth at bedtime. (Patient not taking: Reported on 10/13/2021) 30 tablet 1   temazepam (RESTORIL) 15 MG capsule Take 1 capsule (15 mg total) by mouth at bedtime. (Patient not taking: Reported on 10/13/2021) 30 capsule 0   valbenazine (INGREZZA) 40 MG capsule Take 1 capsule (40 mg total) by mouth daily. (Patient not taking: Reported on 10/13/2021) 30 capsule 0    Musculoskeletal: Strength & Muscle Tone: within normal limits Gait & Station: normal Patient leans: N/A  Psychiatric Specialty Exam:  Presentation  General Appearance: Appropriate for Environment; Casual  Eye Contact:Good  Speech:Garbled  Speech Volume:Normal  Handedness:Right  Mood and Affect  Mood:Euthymic  Affect:Blunt  Thought Process  Thought Processes:Coherent  Descriptions of Associations:Intact  Orientation:Full (Time, Place and Person)  Thought Content: illogical History of Schizophrenia/Schizoaffective disorder:Yes  Duration of Psychotic Symptoms:Greater than six months  Hallucinations:Hallucinations: None Description of Auditory Hallucinations: n/a  Ideas of Reference:None  Suicidal Thoughts:Suicidal Thoughts: No  Homicidal Thoughts:Homicidal Thoughts: No  Sensorium  Memory:Immediate Fair; Remote Fair; Recent Fair  Judgment:Impaired  Insight:Shallow  Executive Functions  Concentration:Fair  Attention Span:Fair  [place]  Language:Poor  Psychomotor Activity  Psychomotor Activity:Psychomotor Activity: Restlessness Extrapyramidal Side Effects (EPS): Tardive Dyskinesia AIMS Completed?: No  Assets  Assets:Financial Resources/Insurance; Social Support; Physical Health; Housing  Sleep  Sleep:Sleep: Good Number of Hours of Sleep: 6  Physical Exam: Physical Exam Vitals and nursing note reviewed.  Constitutional:      Appearance: Normal appearance.  HENT:     Right Ear: External ear normal.     Left Ear: External ear normal.     Nose: Nose normal.     Mouth/Throat:     Mouth: Mucous  membranes are moist.     Pharynx: Oropharynx is clear.  Eyes:     Extraocular Movements: Extraocular movements intact.     Conjunctiva/sclera: Conjunctivae normal.     Pupils: Pupils are equal, round, and reactive to light.  Neck:     Vascular: No carotid bruit.  Cardiovascular:     Rate and Rhythm: Bradycardia present.     Heart sounds: Normal heart sounds.     Comments: BP 115/59, P 53. Nursing staff to recheck VS. Pulmonary:     Effort: Pulmonary effort is normal. No respiratory distress.  Abdominal:     Palpations: Abdomen is soft.  Genitourinary:    Comments: Deferred Musculoskeletal:        General: No deformity. Normal range of motion.     Cervical back: Normal range of motion and neck supple.  Skin:    General: Skin is warm.  Neurological:     General: No focal deficit present.     Mental Status: He is alert and oriented to person, place, and time.     Motor: No weakness.  Psychiatric:     Comments: Restless   Review of Systems  Constitutional: Negative.  Negative for chills and fever.  HENT: Negative.  Negative for hearing loss and tinnitus.   Eyes: Negative.  Negative for blurred vision and double vision.  Respiratory: Negative.  Negative for cough, sputum production, shortness of breath and wheezing.   Cardiovascular: Negative.  Negative for chest pain and palpitations.        BP 115/59, P 53. Nursing staff to recheck VS  Gastrointestinal:  Negative for heartburn, nausea and vomiting.  Genitourinary:  Negative for dysuria and frequency.  Skin: Negative.  Negative for itching and rash.  Endo/Heme/Allergies: Negative.  Negative for environmental allergies and polydipsia. Does not bruise/bleed easily.       See allergy list   Blood pressure (!) 115/59, pulse (!) 53, temperature 97.6 F (36.4 C), temperature source Oral, resp. rate 16, height 5\' 10"  (1.778 m), weight 63.5 kg, SpO2 98 %. Body mass index is 20.09 kg/m.  Treatment Plan Summary: Daily contact with patient to assess and evaluate symptoms and progress in treatment and Medication management  Disposition: Recommend psychiatric Inpatient admission when medically cleared.  Laretta Bolster, FNP 10/15/2021 9:19 PM

## 2021-10-15 NOTE — BH Assessment (Signed)
Per Malachy Chamber, NP, patient continues to meet criteria for inpatient psychiatric treatment. Per Delaney Meigs, patient accepted to [facility] for admission 10/17/2021 (after 9 am). The accepting provider is Estill Cotta, MD. Patient to present to the main campus upon arrival. Nurse report #[phone]. Patient's nurse at [facility] Morrell Riddle, RN) provided disposition updates.

## 2021-10-15 NOTE — BH Assessment (Addendum)
Disposition:  Continue to recommend inpatient psychiatric treatment, per Nira Conn, NP.    [facility] Selena Batten, RN) notified of patient's bed needs and confirmed no available beds.   Patient also referred to the following hospitals for consideration of inpatient treatment.       [facility]  Pending - Request Sent N/A [address] [place] [address] [phone] --  [facility]  Pending - Request Sent N/A [address], [place] [address] [phone] --  [facility]  Pending - Request Sent N/A [address] [name] [address] [phone] --  [facility]  Pending - Request Sent N/A [address], [place] [address] [phone] --  [facility]  Pending - Request Sent N/A [address] [place], [place] [address] [phone] --  [facility]  Pending - Request Sent N/A [address] [place] [address] [phone] --  [facility]  Pending - Request Sent N/A [address] [place] [address] [phone] --  [facility]  Pending - Request Sent N/A [address] [place] [address] [phone] --  [facility]  Pending - Request Sent N/A [address] [place] [address] [phone] --  [facility]  Pending - Request Sent N/A [address] [place] [address] [phone] --  [facility]  Pending - Request Sent N/A [address], [place] [address] [phone] --  [facility]  Pending - Request Sent N/A [address] [place] [address] [phone] --  [facility]  Pending - Request Sent N/A [address] [place],  [place] [address] [phone]

## 2021-10-15 NOTE — ED Notes (Signed)
Patient to room 29. Calm and cooperative. Quiet and guarded. Poor eye contact. Limited verbal interaction.

## 2021-10-15 NOTE — ED Notes (Signed)
Patient to room 29. Patient calm and cooperative with instructions.  Poor eye contact and guarded.

## 2021-10-15 NOTE — ED Notes (Addendum)
Pt given breakfast tray. Still restless, making repeated attempts to walk off toward exits and requiring increased physical redirection to stay in appropriate area

## 2021-10-15 NOTE — ED Notes (Signed)
Pt ambulated to bathroom w/o difficulty. Pt is cooperative this morning, restless and fidgety but easily redirectable. 1:1 observation maintained

## 2021-10-16 NOTE — ED Provider Notes (Signed)
Emergency Medicine Observation Re-evaluation Note  [name] is a 41 y.o. male, seen on rounds today.  Pt initially presented to the ED for complaints of Psychiatric Evaluation Currently, the patient is no acute distress.  Physical Exam  BP 101/60 (BP Location: Right Arm)   Pulse 68   Temp 97.9 F (36.6 C) (Oral)   Resp 16   Ht 1.778 m (5\' 10" )   Wt 63.5 kg   SpO2 99%   BMI 20.09 kg/m  Physical Exam   ED Course / MDM  EKG:EKG Interpretation  Date/Time:  Saturday October 13 2021 13:46:54 EDT Ventricular Rate:  94 PR Interval:  126 QRS Duration: 92 QT Interval:  374 QTC Calculation: 467 R Axis:   96 Text Interpretation: Normal sinus rhythm Rightward axis Biventricular hypertrophy Abnormal ECG When compared with ECG of 10-Aug-2021 07:59, No significant change since last tracing Confirmed by 12-Aug-2021 [phone]) on 10/14/2021 6:22:32 PM  I have reviewed the labs performed to date as well as medications administered while in observation.  Recent changes in the last 24 hours include none.  Plan  Current plan is for placement.    12/14/2021, MD 10/16/21 1309

## 2021-10-16 NOTE — ED Notes (Signed)
Patient was talking to himself last night and doing some type of exercise in the corner. But around 12 he went to sleep.

## 2021-10-17 NOTE — ED Provider Notes (Signed)
Emergency Medicine Observation Re-evaluation Note  [name] is a 41 y.o. male, seen on rounds today.  Pt initially presented to the ED for complaints of Psychiatric Evaluation Currently, the patient is resting, in no distress.  Physical Exam  BP 99/61 (BP Location: Left Arm)   Pulse 63   Temp (!) 97.4 F (36.3 C) (Oral)   Resp 18   Ht 5\' 10"  (1.778 m)   Wt 63.5 kg   SpO2 97%   BMI 20.09 kg/m  Physical Exam General: No distress Cardiac: Regular rate and rhythm Lungs: No increased work of breathing Psych: No insight into his presentation  ED Course / MDM  EKG:EKG Interpretation  Date/Time:  Saturday October 13 2021 13:46:54 EDT Ventricular Rate:  94 PR Interval:  126 QRS Duration: 92 QT Interval:  374 QTC Calculation: 467 R Axis:   96 Text Interpretation: Normal sinus rhythm Rightward axis Biventricular hypertrophy Abnormal ECG When compared with ECG of 10-Aug-2021 07:59, No significant change since last tracing Confirmed by 12-Aug-2021 [phone]) on 10/14/2021 6:22:32 PM  I have reviewed the labs performed to date as well as medications administered while in observation.  Recent changes in the last 24 hours include acceptance to [facility], Dr. [facility] is except.  Plan  Current plan is for transfer to [facility], patient was informed, but is unclear if this has registered.    [facility], MD 10/17/21 1001

## 2021-10-17 NOTE — ED Notes (Signed)
Patient discharged off unit to facility per provider. Patient alert and cooperative.  Discharge information and belongings given to [facility] for transport. Patient ambulatory off unit, escorted and transported by [facility]
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
## 2021-11-06 ENCOUNTER — Ambulatory Visit (HOSPITAL_COMMUNITY): Payer: Medicaid Other | Admitting: Psychiatry

## 2021-11-18 ENCOUNTER — Ambulatory Visit (HOSPITAL_COMMUNITY)
Admission: EM | Admit: 2021-11-18 | Discharge: 2021-11-18 | Disposition: A | Discharge status: Home / Self Care | Payer: Medicaid Other | Attending: Family | Admitting: Family

## 2021-11-18 DIAGNOSIS — F200 Paranoid schizophrenia: Secondary | ICD-10-CM | POA: Insufficient documentation

## 2021-11-18 DIAGNOSIS — F17210 Nicotine dependence, cigarettes, uncomplicated: Secondary | ICD-10-CM | POA: Insufficient documentation

## 2021-11-18 DIAGNOSIS — Z79899 Other long term (current) drug therapy: Secondary | ICD-10-CM | POA: Insufficient documentation

## 2021-11-18 MED ORDER — VALBENAZINE TOSYLATE 40 MG PO CAPS
40.0000 mg | ORAL_CAPSULE | Freq: Every day | ORAL | Status: DC
  Administered 2021-11-18: 40 mg via ORAL
  Filled 2021-11-18: qty 1

## 2021-11-18 MED ORDER — OLANZAPINE 10 MG PO TBDP
10.0000 mg | ORAL_TABLET | Freq: Every day | ORAL | Status: DC
  Administered 2021-11-18: 10 mg via ORAL
  Filled 2021-11-18 (×2): qty 1

## 2021-11-18 MED ORDER — TRAZODONE HCL 50 MG PO TABS: 50.0000 mg | ORAL_TABLET | Freq: Every evening | ORAL | Status: DC | PRN

## 2021-11-18 MED ORDER — ALUM & MAG HYDROXIDE-SIMETH 200-200-20 MG/5ML PO SUSP: 30.0000 mL | ORAL | Status: DC | PRN

## 2021-11-18 MED ORDER — MAGNESIUM HYDROXIDE 400 MG/5ML PO SUSP: 30.0000 mL | Freq: Every day | ORAL | Status: DC | PRN

## 2021-11-18 MED ORDER — ACETAMINOPHEN 325 MG PO TABS: 650.0000 mg | ORAL_TABLET | Freq: Four times a day (QID) | ORAL | Status: DC | PRN

## 2021-11-18 NOTE — ED Notes (Signed)
Patient was discharged to home by provider. Patient's father was given AVS. Patient was medicated prior to discharge.
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
## 2021-11-18 NOTE — ED Provider Notes (Signed)
Behavioral Health Urgent Care Medical Screening Exam  Patient Name: [name] MRN: [id number] Date of Evaluation: 11/18/21 Chief Complaint:   Diagnosis:  Final diagnoses:  Paranoid schizophrenia (HCC)   History of Present illness: [name] is a 41 y.o. male. Patient presents voluntarily to [facility] behavioral health for walk-in assessment.  Patient accompanied by his father, [name], who remains present during assessment as patient prefers.  [name] is assessed, face-to-face, by nurse practitioner.  He is alert and oriented to self however unable to articulate his birthdate at this time.    He presents with anxious mood, noncongruent affect.  Patient is visualized mumbling to himself throughout assessment, he appears to look toward unoccupied corner of room.  Appears to be actively responding to internal stimuli.  Patient denies auditory and visual hallucinations.  He states "I am competent, I do not have schizophrenia, I can take my medications."   Patient reports recent stressors include "my dad is fighting all the time, when I clean up the house, the more I do the less he does."  [name] is standing in assessment area.  He is disheveled and soiled.  He is malodorous.  Patient has slept very little for approximately 2 days.  He also has eaten very little.  Patient has been diagnosed with paranoid schizophrenia.  He was recently discharged from [facility] several weeks ago.  Per patient's father patient has been stable on Haldol Decanoate100 IM every 4 weeks briefly prior to admission to [facility] last month.    He denies suicidal and homicidal ideation.  Denies any history of suicide attempt, denies history of nonsuicidal self-harm behavior.  Patient resides in [place] with his father and 62 year old niece.  He denies access to weapons.  He is not employed.  He denies alcohol and substance use, reports daily use of tobacco cigarettes.  Patient  offered support and encouragement.  Patient placed under involuntary commitment by this Clinical research associate.  Patient given olanzapine Zydis 10 mg p.o.  Patient's father, [name].requests discharge home.  [name] verbalizes agreement with plan.  He states "I want to follow-up with [name]."  Patient continues to deny suicidal and homicidal ideation.  Patient's mumbling has decreased after olanzapine dose.  He denies auditory and visual hallucinations.  Patient does not appear to be responding to internal stimuli currently. [name] oriented to self and situation, at baseline per father/primary caregiver.  Patient's father states "we have an appointment at [facility] tomorrow, I think we just need to go forward with that and follow the plan.  I think by coming here he sees that if he does not follow up and take medications or he will have to come back here."  Patient's father all p.o. medications have been discontinued related to patient's noncompliance.  Current medications include only Haldol Decanoate 100 mg IM every 4 weeks.   Patient and family are educated and verbalize understanding of mental health resources and other crisis services in the community. They are instructed to call 911 and present to the nearest emergency room should patient experience any suicidal/homicidal ideation, auditory/visual/hallucinations, or detrimental worsening of mental health condition.     Psychiatric Specialty Exam  Presentation  General Appearance:Disheveled  Eye Contact:Fair  Speech:Slurred  Speech Volume:Normal  Handedness:Right   Mood and Affect  Mood: Euthymic  Affect: Congruent   Thought Process  Thought Processes: Coherent; Goal Directed  Descriptions of Associations:Intact  Orientation:Partial  Thought Content:Logical  Diagnosis of Schizophrenia or Schizoaffective disorder  in past: Yes  Duration of Psychotic Symptoms: Less than six months  Hallucinations:Other (comment) (patient denies,  appeares to be actively responding to internal stimuli) n/a  Ideas of Reference:Paranoia  Suicidal Thoughts:No  Homicidal Thoughts:No   Sensorium  Memory: Immediate Fair  Judgment: Impaired  Insight: Present   Executive Functions  Concentration: Fair  Attention Span: Fair  Recall: [employer] of Knowledge: Fair  Language: Fair   Psychomotor Activity  Psychomotor Activity: Increased Tardive Dyskinesia No   Assets  Assets: Catering manager; Housing; Physical Health; Resilience; Social Support   Sleep  Sleep: Poor  Number of hours:  6   Nutritional Assessment (For OBS and [facility] admissions only) Has the patient had a weight loss or gain of 10 pounds or more in the last 3 months?: No Has the patient had a decrease in food intake/or appetite?: Yes Does the patient have dental problems?: No Does the patient have eating habits or behaviors that may be indicators of an eating disorder including binging or inducing vomiting?: No Has the patient recently lost weight without trying?: 2.0 Has the patient been eating poorly because of a decreased appetite?: 0 Malnutrition Screening Tool Score: 2 Nutritional Assessment Referrals: Medication/Tx changes    Physical Exam: Physical Exam Vitals and nursing note reviewed.  Constitutional:      Appearance: Normal appearance. He is well-developed.  HENT:     Head: Normocephalic and atraumatic.     Nose: Nose normal.  Cardiovascular:     Rate and Rhythm: Normal rate.  Pulmonary:     Effort: Pulmonary effort is normal.  Musculoskeletal:        General: Normal range of motion.     Cervical back: Normal range of motion.  Skin:    General: Skin is warm and dry.  Neurological:     Mental Status: He is alert.  Psychiatric:        Attention and Perception: Attention normal.        Mood and Affect: Mood normal.        Speech: Speech is slurred.        Behavior: Behavior is cooperative.     Review of Systems  Constitutional: Negative.   HENT: Negative.    Eyes: Negative.   Respiratory: Negative.    Cardiovascular: Negative.   Gastrointestinal: Negative.   Genitourinary: Negative.   Musculoskeletal: Negative.   Skin: Negative.   Neurological: Negative.    There were no vitals taken for this visit. There is no height or weight on file to calculate BMI.Patient refuses assessment of vital signs.   Musculoskeletal: Strength & Muscle Tone: within normal limits Gait & Station: normal Patient leans: N/A   [facility] Discharge Disposition for Follow up and Recommendations: Based on my evaluation the patient does not appear to have an emergency medical condition and can be discharged with resources and follow up care in outpatient services for Medication Management and Individual Therapy Patient reviewed with Dr Hampton Abbot. Follow up with established outpatient psychiatry at [facility], appt scheduled on 11/19/2021. Continue current medications.    Lucky Rathke, FNP 11/18/2021, 3:55 PM

## 2021-11-19 ENCOUNTER — Telehealth (HOSPITAL_COMMUNITY): Payer: Self-pay | Admitting: Internal Medicine

## 2021-11-19 NOTE — BH Assessment (Signed)
Care Management - [facility] Follow Up Discharges   Writer attempted to make contact with patient today and was unsuccessful.  Writer left a HIPPA compliant voice message.   Per chart review, patient will follow up with established provider at [facility] on 11-19-2021.

## 2022-02-08 ENCOUNTER — Encounter (HOSPITAL_COMMUNITY): Payer: Self-pay | Admitting: Student

## 2022-02-08 ENCOUNTER — Ambulatory Visit (HOSPITAL_COMMUNITY)
Admission: EM | Admit: 2022-02-08 | Discharge: 2022-02-10 | Disposition: A | Discharge status: Other Acute Inpatient Hospital | Payer: Medicaid Other | Attending: Psychiatry | Admitting: Psychiatry

## 2022-02-08 DIAGNOSIS — F200 Paranoid schizophrenia: Secondary | ICD-10-CM | POA: Diagnosis present

## 2022-02-08 DIAGNOSIS — F209 Schizophrenia, unspecified: Secondary | ICD-10-CM | POA: Insufficient documentation

## 2022-02-08 DIAGNOSIS — F203 Undifferentiated schizophrenia: Secondary | ICD-10-CM | POA: Diagnosis not present

## 2022-02-08 DIAGNOSIS — Z1152 Encounter for screening for COVID-19: Secondary | ICD-10-CM | POA: Insufficient documentation

## 2022-02-08 DIAGNOSIS — Z982 Presence of cerebrospinal fluid drainage device: Secondary | ICD-10-CM | POA: Insufficient documentation

## 2022-02-08 LAB — RESP PANEL BY RT-PCR (RSV, FLU A&B, COVID)  RVPGX2
Influenza A by PCR: NEGATIVE
Influenza B by PCR: NEGATIVE
Resp Syncytial Virus by PCR: NEGATIVE
SARS Coronavirus 2 by RT PCR: NEGATIVE

## 2022-02-08 LAB — POC SARS CORONAVIRUS 2 AG: SARSCOV2ONAVIRUS 2 AG: NEGATIVE

## 2022-02-08 MED ORDER — LORAZEPAM 1 MG PO TABS
2.0000 mg | ORAL_TABLET | Freq: Four times a day (QID) | ORAL | Status: DC | PRN
  Administered 2022-02-08 – 2022-02-09 (×2): 2 mg via ORAL
  Filled 2022-02-08 (×2): qty 2

## 2022-02-08 MED ORDER — ADULT MULTIVITAMIN W/MINERALS CH
1.0000 | ORAL_TABLET | Freq: Every day | ORAL | Status: DC
  Filled 2022-02-08: qty 1

## 2022-02-08 MED ORDER — LOPERAMIDE HCL 2 MG PO CAPS: 2.0000 mg | ORAL_CAPSULE | ORAL | Status: DC | PRN

## 2022-02-08 MED ORDER — ALUM & MAG HYDROXIDE-SIMETH 200-200-20 MG/5ML PO SUSP: 30.0000 mL | ORAL | Status: DC | PRN

## 2022-02-08 MED ORDER — ZIPRASIDONE MESYLATE 20 MG IM SOLR: 20.0000 mg | Freq: Four times a day (QID) | INTRAMUSCULAR | Status: DC | PRN

## 2022-02-08 MED ORDER — OLANZAPINE 10 MG IM SOLR: 10.0000 mg | Freq: Two times a day (BID) | INTRAMUSCULAR | Status: DC | PRN

## 2022-02-08 MED ORDER — OLANZAPINE 10 MG PO TBDP
10.0000 mg | ORAL_TABLET | Freq: Two times a day (BID) | ORAL | Status: DC | PRN
  Administered 2022-02-08 – 2022-02-09 (×2): 10 mg via ORAL
  Filled 2022-02-08 (×2): qty 1

## 2022-02-08 MED ORDER — LORAZEPAM 1 MG PO TABS: 1.0000 mg | ORAL_TABLET | Freq: Four times a day (QID) | ORAL | Status: DC | PRN

## 2022-02-08 MED ORDER — MAGNESIUM HYDROXIDE 400 MG/5ML PO SUSP: 30.0000 mL | Freq: Every day | ORAL | Status: DC | PRN

## 2022-02-08 MED ORDER — THIAMINE MONONITRATE 100 MG PO TABS
100.0000 mg | ORAL_TABLET | Freq: Every day | ORAL | Status: DC
  Filled 2022-02-08: qty 1

## 2022-02-08 MED ORDER — ACETAMINOPHEN 325 MG PO TABS: 650.0000 mg | ORAL_TABLET | Freq: Four times a day (QID) | ORAL | Status: DC | PRN

## 2022-02-08 MED ORDER — ONDANSETRON 4 MG PO TBDP: 4.0000 mg | ORAL_TABLET | Freq: Four times a day (QID) | ORAL | Status: DC | PRN

## 2022-02-08 NOTE — ED Provider Notes (Signed)
[facility] Continuous Assessment Admission H&P  Date: 02/08/22 Patient Name: [name] MRN: [id number] Chief Complaint:  Chief Complaint  Patient presents with   Schizophrenia      Diagnoses:  Final diagnoses:  None   HPI:  [name] is a 42 y.o. male, with PMH PMH SCzA, TD,  hydrocephalus s/p VP shunt presented, multiple inpatient psych admission (last time 10/13/2021 at [facility]) voluntary to [facility] (02/08/2022) via [employer] for bizarre and disorganized behavior at a bank. IVC'd on 02/08/2022 by Dr. Alfonse Spruce.  Rx stabilized at [facility] on 08/04/2021: zydis 15 mg qHS, Restoril 15 mg nightly, Ingrezza 40 mg nightly, Remeron 7.5 mg nightly  Patient narrative: Appearance: Bizarre, Disheveled Thought process: Scattered, Tangential, Rumination, Delusions, Illogical, Perseveration Unable to fully assess due to psychosis and disorganized behavior.  Patient declined need of medication, stated he does not need issues with sleep.  Reported that his orange juice is enough.  Patient's father jumped from wanting to help other people, not having HIV, decline better fitting pants, no need for medications, being hungry.  During this time he would look upward and intermittently around the room. He is unable to provide past psych hx, insisting he doesn't need medications. Patient reported drinking alcohol, but "then wait, I don't do that". Declined difficulties with sleep and appetite.  [id number] Thoughts: No [id number] Thoughts: No [id number]: Auditory, Visual Ideas of Ref: (exam limited by psychosis)   Mood: Euphoric, Labile Sleep:Poor Appetite: Poor  Review of Systems  Unable to perform ROS: Psychiatric disorder     Past Psychiatric History:   Schizophrenia, Tardive Dyskinesia, Hydrocephalus SP Shunt, and multiple hospitalizations.   Family Psychiatric History:  Maternal Aunt: some unknown diagnosis No known Substance Abuse or Suicides  Social History:  Unable to assess    BP (!) 135/120 (BP Location: Left Arm)   Pulse 100   Resp 18   SpO2 95%   Musculoskeletal  Strength & Muscle Tone: within normal limits Gait & Station: normal Patient leans: N/A   Physical Exam Vitals and nursing note reviewed.  Constitutional:      General: He is not in acute distress.    Appearance: He is ill-appearing. He is not toxic-appearing or diaphoretic.  HENT:     Head: Normocephalic and atraumatic.  Pulmonary:     Effort: Pulmonary effort is normal. No respiratory distress.  Neurological:     Mental Status: He is alert.     Psychiatric Specialty Exam  Presentation  General Appearance:Bizarre, Disheveled Eye Contact:Fleeting (eyes darting upwards and around) Speech:Clear and Coherent, Pressured Volume:Normal Handedness:Right  Mood and Affect  Mood:Euphoric, Labile Affect:Appropriate, Congruent, Labile, Full Range  Thought Process  Thought Process:Disorganized (flight of ideas) Descriptions of Associations:Loose  Thought Content Suicidal Thoughts:Suicidal Thoughts: No Homicidal Thoughts:Homicidal Thoughts: No Hallucinations:Hallucinations: Auditory, Visual Ideas of Reference: (exam limited by psychosis) Thought Content:Scattered, Tangential, Rumination, Delusions, Illogical, Perseveration  Sensorium  Memory:Immediate Poor, Recent Poor, Remote Poor Judgment:Impaired Insight:None  Executive Functions  Orientation: (exam limited by psychosis) Language:Poor Concentration:Poor Attention:Poor Recall:Poor Fund of Knowledge:Poor  Psychomotor Activity  Psychomotor Activity:Psychomotor Activity: Increased, Restlessness  Assets  Assets:Resilience, Leisure Time  Sleep  Quality:Poor  Is the patient at risk to self? Yes  Has the patient been a risk to self in the past 6 months? Yes .    Has the patient been a risk to self within the distant past? Yes   Is the patient a risk to others? Yes   Has the  patient been a risk to others  in the past 6 months? Yes   Has the patient been a risk to others within the distant past? Yes    PHQ 2-9:  [place] ED from 08/03/2021 in [facility] ED from 02/20/2021 in [facility] ED from 11/28/2020 in [place]  Thoughts that you would be better off dead, or of hurting yourself in some way Not at all Not at all Several days  PHQ-9 Total Score 5 11 9        Flowsheet Row ED from 02/08/2022 in [facility] ED from 11/18/2021 in [facility] ED from 10/13/2021 in [place] DEPT  C-SSRS RISK CATEGORY No Risk No Risk No Risk        Past Medical History:  Past Medical History:  Diagnosis Date   Schizophrenia [facility])     Past Surgical History:  Procedure Laterality Date   shunt surgery     Family History:  Family History  Problem Relation Age of Onset   Stroke Mother    Thyroid disease Father    Social History:  Social History   Socioeconomic History   Marital status: Single    Spouse name: Not on file   Number of children: Not on file   Years of education: Not on file   Highest education level: Not on file  Occupational History   Not on file  Tobacco Use   Smoking status: Every Day    Types: Cigars   Smokeless tobacco: Never  Vaping Use   Vaping Use: Former  Substance and Sexual Activity   Alcohol use: Not Currently   Drug use: Not Currently    Types: Marijuana   Sexual activity: Not on file  Other Topics Concern   Not on file  Social History Narrative   Not on file   Social Determinants of Health   Financial Resource Strain: Not on file  Food Insecurity: Not on file  Transportation Needs: Not on file  Physical Activity: Not on file  Stress: Not on file  Social Connections: Not on file  Intimate Partner Violence: Not on file   SDOH:  SDOH Screenings   Depression  (PHQ2-9): Medium Risk (08/03/2021)  Tobacco Use: High Risk (02/08/2022)   Last Labs:  Admission on 10/13/2021, Discharged on 10/17/2021  Component Date Value Ref Range Status   Sodium 10/13/2021 139  135 - 145 mmol/L Final   Potassium 10/13/2021 3.3 (L)  3.5 - 5.1 mmol/L Final   Chloride 10/13/2021 106  98 - 111 mmol/L Final   CO2 10/13/2021 24  22 - 32 mmol/L Final   Glucose, Bld 10/13/2021 104 (H)  70 - 99 mg/dL Final   Glucose reference range applies only to samples taken after fasting for at least 8 hours.   BUN 10/13/2021 14  6 - 20 mg/dL Final   Creatinine, Ser 10/13/2021 0.92  0.61 - 1.24 mg/dL Final   Calcium 10/13/2021 9.0  8.9 - 10.3 mg/dL Final   Total Protein 10/13/2021 8.6 (H)  6.5 - 8.1 g/dL Final   Albumin 10/13/2021 4.2  3.5 - 5.0 g/dL Final   AST 10/13/2021 43 (H)  15 - 41 U/L Final   ALT 10/13/2021 18  0 - 44 U/L Final   Alkaline Phosphatase 10/13/2021 72  38 - 126 U/L Final   Total Bilirubin 10/13/2021 0.7  0.3 - 1.2 mg/dL Final  GFR, Estimated 10/13/2021 >60  >60 mL/min Final   Comment: (NOTE) Calculated using the CKD-EPI Creatinine Equation (2021)    Anion gap 10/13/2021 9  5 - 15 Final   Performed at [facility], [place] [address] [place], [place] [address]   Alcohol, Ethyl (B) 10/13/2021 <10  <10 mg/dL Final   Comment: (NOTE) Lowest detectable limit for serum alcohol is 10 mg/dL.  For medical purposes only. Performed at [facility], [place] [address] [place], [place] [address]    WBC 10/13/2021 9.9  4.0 - 10.5 K/uL Final   RBC 10/13/2021 4.84  4.22 - 5.81 MIL/uL Final   Hemoglobin 10/13/2021 12.3 (L)  13.0 - 17.0 g/dL Final   HCT 10/13/2021 36.1 (L)  39.0 - 52.0 % Final   MCV 10/13/2021 74.6 (L)  80.0 - 100.0 fL Final   MCH 10/13/2021 25.4 (L)  26.0 - 34.0 pg Final   MCHC 10/13/2021 34.1  30.0 - 36.0 g/dL Final   RDW 10/13/2021 16.1 (H)  11.5 - 15.5 % Final   Platelets 10/13/2021 439 (H)  150 - 400 K/uL Final   nRBC  10/13/2021 0.0  0.0 - 0.2 % Final   Performed at [facility], [place] [address], [place], [place] [address]   Opiates 10/13/2021 NONE DETECTED  NONE DETECTED Final   Cocaine 10/13/2021 NONE DETECTED  NONE DETECTED Final   Benzodiazepines 10/13/2021 POSITIVE (A)  NONE DETECTED Final   Amphetamines 10/13/2021 NONE DETECTED  NONE DETECTED Final   Tetrahydrocannabinol 10/13/2021 NONE DETECTED  NONE DETECTED Final   Barbiturates 10/13/2021 NONE DETECTED  NONE DETECTED Final   Comment: (NOTE) DRUG SCREEN FOR MEDICAL PURPOSES ONLY.  IF CONFIRMATION IS NEEDED FOR ANY PURPOSE, NOTIFY LAB WITHIN 5 DAYS.  LOWEST DETECTABLE LIMITS FOR URINE DRUG SCREEN Drug Class                     Cutoff (ng/mL) Amphetamine and metabolites    1000 Barbiturate and metabolites    200 Benzodiazepine                 [id number] Tricyclics and metabolites     300 Opiates and metabolites        300 Cocaine and metabolites        300 THC                            50 Performed at [facility], [place] [address], [place], [place] [address]    Color, Urine 10/13/2021 YELLOW  YELLOW Final   APPearance 10/13/2021 CLEAR  CLEAR Final   Specific Gravity, Urine 10/13/2021 1.024  1.005 - 1.030 Final   pH 10/13/2021 6.0  5.0 - 8.0 Final   Glucose, UA 10/13/2021 NEGATIVE  NEGATIVE mg/dL Final   Hgb urine dipstick 10/13/2021 NEGATIVE  NEGATIVE Final   Bilirubin Urine 10/13/2021 NEGATIVE  NEGATIVE Final   Ketones, ur 10/13/2021 NEGATIVE  NEGATIVE mg/dL Final   Protein, ur 10/13/2021 NEGATIVE  NEGATIVE mg/dL Final   Nitrite 10/13/2021 NEGATIVE  NEGATIVE Final   Leukocytes,Ua 10/13/2021 NEGATIVE  NEGATIVE Final   Performed at [facility], [place] [address], [place], [place] [address]   SARS Coronavirus 2 by RT PCR 10/13/2021 NEGATIVE  NEGATIVE Final   Comment: (NOTE) SARS-CoV-2 target nucleic acids are NOT DETECTED.  The SARS-CoV-2 RNA is generally detectable in upper  respiratory specimens during the acute phase  of infection. The lowest concentration of SARS-CoV-2 viral copies this assay can detect is 138 copies/mL. A negative result does not preclude SARS-Cov-2 infection and should not be used as the sole basis for treatment or other patient management decisions. A negative result may occur with  improper specimen collection/handling, submission of specimen other than nasopharyngeal swab, presence of viral mutation(s) within the areas targeted by this assay, and inadequate number of viral copies(<138 copies/mL). A negative result must be combined with clinical observations, patient history, and epidemiological information. The expected result is Negative.  Fact Sheet for Patients:  EntrepreneurPulse.com.au  Fact Sheet for Healthcare Providers:  IncredibleEmployment.be  This test is no                          t yet approved or cleared by the [place] FDA and  has been authorized for detection and/or diagnosis of SARS-CoV-2 by FDA under an Emergency Use Authorization (EUA). This EUA will remain  in effect (meaning this test can be used) for the duration of the COVID-19 declaration under Section 564(b)(1) of the Act, 21 U.S.C.section 360bbb-3(b)(1), unless the authorization is terminated  or revoked sooner.       Influenza A by PCR 10/13/2021 NEGATIVE  NEGATIVE Final   Influenza B by PCR 10/13/2021 NEGATIVE  NEGATIVE Final   Comment: (NOTE) The Xpert Xpress SARS-CoV-2/FLU/RSV plus assay is intended as an aid in the diagnosis of influenza from Nasopharyngeal swab specimens and should not be used as a sole basis for treatment. Nasal washings and aspirates are unacceptable for Xpert Xpress SARS-CoV-2/FLU/RSV testing.  Fact Sheet for Patients: EntrepreneurPulse.com.au  Fact Sheet for Healthcare Providers: IncredibleEmployment.be  This test is not yet approved or  cleared by the [place] FDA and has been authorized for detection and/or diagnosis of SARS-CoV-2 by FDA under an Emergency Use Authorization (EUA). This EUA will remain in effect (meaning this test can be used) for the duration of the COVID-19 declaration under Section 564(b)(1) of the Act, 21 U.S.C. section 360bbb-3(b)(1), unless the authorization is terminated or revoked.  Performed at [facility], [place] [address], [place], [place] [address]   Admission on 08/28/2021, Discharged on 08/31/2021  Component Date Value Ref Range Status   SARS Coronavirus 2 by RT PCR 08/28/2021 NEGATIVE  NEGATIVE Final   Comment: (NOTE) SARS-CoV-2 target nucleic acids are NOT DETECTED.  The SARS-CoV-2 RNA is generally detectable in upper respiratory specimens during the acute phase of infection. The lowest concentration of SARS-CoV-2 viral copies this assay can detect is 138 copies/mL. A negative result does not preclude SARS-Cov-2 infection and should not be used as the sole basis for treatment or other patient management decisions. A negative result may occur with  improper specimen collection/handling, submission of specimen other than nasopharyngeal swab, presence of viral mutation(s) within the areas targeted by this assay, and inadequate number of viral copies(<138 copies/mL). A negative result must be combined with clinical observations, patient history, and epidemiological information. The expected result is Negative.  Fact Sheet for Patients:  EntrepreneurPulse.com.au  Fact Sheet for Healthcare Providers:  IncredibleEmployment.be  This test is no                          t yet approved or cleared by the [place] FDA and  has been authorized for detection and/or diagnosis of SARS-CoV-2 by FDA under an Emergency Use Authorization (EUA). This EUA will remain  in effect (meaning this test can be used) for the duration of  the COVID-19 declaration under Section 564(b)(1) of the Act, 21 U.S.C.section 360bbb-3(b)(1), unless the authorization is terminated  or revoked sooner.       Influenza A by PCR 08/28/2021 NEGATIVE  NEGATIVE Final   Influenza B by PCR 08/28/2021 NEGATIVE  NEGATIVE Final   Comment: (NOTE) The Xpert Xpress SARS-CoV-2/FLU/RSV plus assay is intended as an aid in the diagnosis of influenza from Nasopharyngeal swab specimens and should not be used as a sole basis for treatment. Nasal washings and aspirates are unacceptable for Xpert Xpress SARS-CoV-2/FLU/RSV testing.  Fact Sheet for Patients: EntrepreneurPulse.com.au  Fact Sheet for Healthcare Providers: IncredibleEmployment.be  This test is not yet approved or cleared by the [place] FDA and has been authorized for detection and/or diagnosis of SARS-CoV-2 by FDA under an Emergency Use Authorization (EUA). This EUA will remain in effect (meaning this test can be used) for the duration of the COVID-19 declaration under Section 564(b)(1) of the Act, 21 U.S.C. section 360bbb-3(b)(1), unless the authorization is terminated or revoked.  Performed at [facility], [place] [address] [place], [place] [address]    Sodium 08/28/2021 141  135 - 145 mmol/L Final   Potassium 08/28/2021 3.7  3.5 - 5.1 mmol/L Final   Chloride 08/28/2021 105  98 - 111 mmol/L Final   CO2 08/28/2021 26  22 - 32 mmol/L Final   Glucose, Bld 08/28/2021 144 (H)  70 - 99 mg/dL Final   Glucose reference range applies only to samples taken after fasting for at least 8 hours.   BUN 08/28/2021 9  6 - 20 mg/dL Final   Creatinine, Ser 08/28/2021 0.93  0.61 - 1.24 mg/dL Final   Calcium 08/28/2021 9.4  8.9 - 10.3 mg/dL Final   Total Protein 08/28/2021 9.1 (H)  6.5 - 8.1 g/dL Final   Albumin 08/28/2021 3.9  3.5 - 5.0 g/dL Final   AST 08/28/2021 25  15 - 41 U/L Final   ALT 08/28/2021 16  0 - 44 U/L Final   Alkaline  Phosphatase 08/28/2021 79  38 - 126 U/L Final   Total Bilirubin 08/28/2021 0.5  0.3 - 1.2 mg/dL Final   GFR, Estimated 08/28/2021 >60  >60 mL/min Final   Comment: (NOTE) Calculated using the CKD-EPI Creatinine Equation (2021)    Anion gap 08/28/2021 10  5 - 15 Final   Performed at [facility], [place] [address], [place], [place] [address]   Alcohol, Ethyl (B) 08/28/2021 <10  <10 mg/dL Final   Comment: (NOTE) Lowest detectable limit for serum alcohol is 10 mg/dL.  For medical purposes only. Performed at [facility], [place] [address], [place],  [address]    Opiates 08/29/2021 NONE DETECTED  NONE DETECTED Final   Cocaine 08/29/2021 NONE DETECTED  NONE DETECTED Final   Benzodiazepines 08/29/2021 POSITIVE (A)  NONE DETECTED Final   Amphetamines 08/29/2021 NONE DETECTED  NONE DETECTED Final   Tetrahydrocannabinol 08/29/2021 NONE DETECTED  NONE DETECTED Final   Barbiturates 08/29/2021 NONE DETECTED  NONE DETECTED Final   Comment: (NOTE) DRUG SCREEN FOR MEDICAL PURPOSES ONLY.  IF CONFIRMATION IS NEEDED FOR ANY PURPOSE, NOTIFY LAB WITHIN 5 DAYS.  LOWEST DETECTABLE LIMITS FOR URINE DRUG SCREEN Drug Class                     Cutoff (ng/mL) Amphetamine and metabolites    1000 Barbiturate and metabolites  200 Benzodiazepine                 035 Tricyclics and metabolites     300 Opiates and metabolites        300 Cocaine and metabolites        300 THC                            50 Performed at [facility], [place] [address] [place], [place] [address]    WBC 08/28/2021 11.6 (H)  4.0 - 10.5 K/uL Final   RBC 08/28/2021 5.39  4.22 - 5.81 MIL/uL Final   Hemoglobin 08/28/2021 13.4  13.0 - 17.0 g/dL Final   HCT 08/28/2021 39.7  39.0 - 52.0 % Final   MCV 08/28/2021 73.7 (L)  80.0 - 100.0 fL Final   MCH 08/28/2021 24.9 (L)  26.0 - 34.0 pg Final   MCHC 08/28/2021 33.8  30.0 - 36.0 g/dL Final   RDW 08/28/2021 15.4  11.5 - 15.5 %  Final   Platelets 08/28/2021 412 (H)  150 - 400 K/uL Final   nRBC 08/28/2021 0.0  0.0 - 0.2 % Final   Neutrophils Relative % 08/28/2021 67  % Final   Neutro Abs 08/28/2021 7.6  1.7 - 7.7 K/uL Final   Lymphocytes Relative 08/28/2021 22  % Final   Lymphs Abs 08/28/2021 2.6  0.7 - 4.0 K/uL Final   Monocytes Relative 08/28/2021 11  % Final   Monocytes Absolute 08/28/2021 1.3 (H)  0.1 - 1.0 K/uL Final   Eosinophils Relative 08/28/2021 0  % Final   Eosinophils Absolute 08/28/2021 0.1  0.0 - 0.5 K/uL Final   Basophils Relative 08/28/2021 0  % Final   Basophils Absolute 08/28/2021 0.1  0.0 - 0.1 K/uL Final   Immature Granulocytes 08/28/2021 0  % Final   Abs Immature Granulocytes 08/28/2021 0.04  0.00 - 0.07 K/uL Final   Performed at [facility], [place] [address], [place], [place] [address]   Salicylate Lvl 37/16/9678 <7.0 (L)  7.0 - 30.0 mg/dL Final   Performed at [place] [address], [place], [place] [address]   Acetaminophen (Tylenol), Serum 08/28/2021 <10 (L)  10 - 30 ug/mL Final   Comment: (NOTE) Therapeutic concentrations vary significantly. A range of 10-30 ug/mL  may be an effective concentration for many patients. However, some  are best treated at concentrations outside of this range. Acetaminophen concentrations >150 ug/mL at 4 hours after ingestion  and >50 ug/mL at 12 hours after ingestion are often associated with  toxic reactions.  Performed at [facility], [place] [address], [place], [place] [address]    Total CK 08/28/2021 532 (H)  49 - 397 U/L Final   Performed at [facility], [place] [address] [place], [place] [address]  Admission on 08/04/2021, Discharged on 08/15/2021  Component Date Value Ref Range Status   Cholesterol 08/06/2021 126  0 - 200 mg/dL Final   Triglycerides 08/06/2021 41  <150 mg/dL Final   HDL 08/06/2021 45  >40 mg/dL Final   Total CHOL/HDL Ratio 08/06/2021 2.8  RATIO Final    VLDL 08/06/2021 8  0 - 40 mg/dL Final   LDL Cholesterol 08/06/2021 73  0 - 99 mg/dL Final   Comment:        Total Cholesterol/HDL:CHD Risk Coronary Heart Disease Risk Table  Men   Women  1/2 Average Risk   3.4   3.3  Average Risk       5.0   4.4  2 X Average Risk   9.6   7.1  3 X Average Risk  23.4   11.0        Use the calculated Patient Ratio above and the CHD Risk Table to determine the patient's CHD Risk.        ATP III CLASSIFICATION (LDL):  <100     mg/dL   Optimal  100-129  mg/dL   Near or Above                    Optimal  130-159  mg/dL   Borderline  160-189  mg/dL   High  >190     mg/dL   Very High Performed at [place] [address] [place], [place] [address]    TSH 08/06/2021 1.335  0.350 - 4.500 uIU/mL Final   Comment: Performed by a 3rd Generation assay with a functional sensitivity of <=0.01 uIU/mL. Performed at [facility], [place] [address] [place], [place] [address]    Hgb A1c MFr Bld 08/06/2021 5.4  4.8 - 5.6 % Final   Comment: REPEATED TO VERIFY (NOTE) Pre diabetes:          5.7%-6.4%  Diabetes:              >6.4%  Glycemic control for   <7.0% adults with diabetes    Mean Plasma Glucose 08/06/2021 108.28  mg/dL Final   Performed at [facility], [place] [address], [place], [place] [address]   Allergies: Hydroxyzine, Mirtazapine, Olanzapine, Risperdal [risperidone], and Temazepam PTA Medications: (Not in a hospital admission)   Medical Decision Making  Patient is currently psychotic 2/2 medication non-adherence. Admitted to [facility] for obs until available inpatient psych bed.   Patient IVC'd by Dr. Alfonse Spruce - petition and first exam completed 02/08/2022    Total Time spent with patient: 20 minutes Recommendations  Based on my evaluation the patient does not appear to have an emergency medical condition. Started CIWA with PRN ativan Started agitation protocol per below    Labs  Reviewed  RESP PANEL BY RT-PCR (RSV, FLU A&B, COVID)  [id number]  CBC WITH DIFFERENTIAL/PLATELET  COMPREHENSIVE METABOLIC PANEL  HEMOGLOBIN A1C  MAGNESIUM  ETHANOL  LIPID PANEL  TSH  PROLACTIN  HEPATITIS PANEL, ACUTE  RPR  HIV ANTIBODY (ROUTINE TESTING W REFLEX)  POCT URINE DRUG SCREEN - MANUAL ENTRY (I-SCREEN)  GC/CHLAMYDIA PROBE AMP ([place]) NOT AT [facility]    Rx [id number] with minerals, 1 tablet, Oral, Daily [START ON 02/09/2022] thiamine, 100 mg, Oral, Daily   Rx [id number], 650 mg, Q6H PRN alum & mag hydroxide-simeth, 30 mL, Q4H PRN loperamide, 2-4 mg, PRN LORazepam, 2 mg, Q6H PRN magnesium hydroxide, 30 mL, Daily PRN OLANZapine zydis, 10 mg, BID PRN  Or OLANZapine, 10 mg, BID PRN ondansetron, 4 mg, Q6H PRN ziprasidone, 20 mg, Q6H PRN      Signed: Merrily Brittle, DO Psychiatry Resident, PGY-2 [facility]  02/08/2022, 4:18 PM

## 2022-02-08 NOTE — ED Notes (Signed)
Pt was searched by security.  Pt lifted his shirt and pant legs with skin intact and no contraband visible. Pt allowed staff to perform covid test but refused blood draw, UDS, EKG at this time.  Pt was escorted to Flex area and is currently sitting on the bed. Pt appears to be RIS and talking to himself. He reports that " I was on a lot of medication and it made me use the bathroom on myself that is why I dont want so much.  He is redirectable and offered applejuice.  Staff will monitor for safety

## 2022-02-08 NOTE — ED Notes (Signed)
Pt sedated, sleeping at present, no distress noted. Skin color good, respirations even & unlabored.  Monitoring for safety.

## 2022-02-08 NOTE — ED Triage Notes (Signed)
Pt presents to [facility] voluntarily escorted by [employer].Pt states he has not taken medication in"years" because it makes him feel worse. Pt reports drinking orange juice for his symptoms instead of medication. Pt appears to be responding to internal stimuli, disorganized. Pt reports visual hallucinations but cannot elaborate. Pt denies SI/HI, drug and alcohol use at this time.

## 2022-02-08 NOTE — Progress Notes (Signed)
Pt is under review at [facility] and [facility]. Per nursing Miguel Rota, RN Sharlene Motts requested the IVC paperwork and confirmed that it was faxed.   [facility] requested labs and CSW sent. CSW will continue to assist with placement.  Benjaman Kindler, MSW, [facility] 02/08/2022 11:44 PM

## 2022-02-08 NOTE — Plan of Care (Signed)
Unable to get in contact with dad. Left HIPAA compliant voicemail x1

## 2022-02-08 NOTE — BH Assessment (Signed)
Received call from access center at [facility] stating they have no male beds available.   Evelena Peat, [facility], [place] [facility] Triage Specialist [phone]

## 2022-02-08 NOTE — ED Notes (Signed)
Pt's IVC papers served and placed in chart

## 2022-02-08 NOTE — ED Notes (Signed)
Pt sleeping@this time. Breathing even and unlabored. Will continue to monitor for safety 

## 2022-02-08 NOTE — ED Notes (Signed)
Pt assisted into scrub pants and shirt.  He has generalized rash scars over his body. Pt also has very unkempt and long toe nails.   Assisted with clean scrubs.  Old clothes were washed.

## 2022-02-08 NOTE — ED Notes (Signed)
Pt was given prn medication as ordered.  Pt was also given a juice and fruit bar.  He is currently sitting in room 135.  Security is at the door.  Staff will continue to monitor

## 2022-02-08 NOTE — ED Notes (Signed)
Unable to obtain blood, urine, and EKG from pt.  Pt is difficult to redirect and jumps from subject to subject.  PRN medication provided, pt took medication without issue.  Will continue to monitor for safety and attempt lab draw once pt is more redirectable.

## 2022-02-08 NOTE — Progress Notes (Signed)
Inpatient Behavioral Health Placement  Pt meets inpatient criteria per Merrily Brittle, DO. There are no available beds at [facility] per [facility]  Referral was sent to the following facilities;    Service Provider Address Phone Fax  [facility]  [address] [place] [address] [phone]  [facility]  [place], [place] [address] [phone]  [facility]  [address] [place] [address] [place]  [facility]  [address], [place] [address] [phone]  [facility]  [place] [place] [address] [place]  [facility]  [address] [place] [address] [phone]  [facility]  [address] [place]  [address] [phone]  [place] [address] [phone]  [facility]  [address], [place] [address] [phone]  [facility]  [address], [place] [address] [phone]  [facility]  [address] [place] [address] [phone]  [facility]  [address], [place] [address] [phone] [facility]  [address], [place] [address] [phone] [facility]  [address] [place] [address] [phone]  [place]  [place], [place] [address] [phone]     Situation ongoing,  CSW will follow up.   Benjaman Kindler, MSW, [facility] 02/08/2022  @ 10:27 PM
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
## 2022-02-08 NOTE — ED Notes (Signed)
Pt given dinner meal 

## 2022-02-09 LAB — COMPREHENSIVE METABOLIC PANEL
ALT: 17 U/L (ref 0–44)
AST: 28 U/L (ref 15–41)
Albumin: 3.5 g/dL (ref 3.5–5.0)
Alkaline Phosphatase: 59 U/L (ref 38–126)
Anion gap: 11 (ref 5–15)
BUN: 5 mg/dL — ABNORMAL LOW (ref 6–20)
CO2: 27 mmol/L (ref 22–32)
Calcium: 9 mg/dL (ref 8.9–10.3)
Chloride: 99 mmol/L (ref 98–111)
Creatinine, Ser: 1.03 mg/dL (ref 0.61–1.24)
GFR, Estimated: >60 mL/min (ref >60)
Glucose, Bld: 108 mg/dL — ABNORMAL HIGH (ref 70–99)
Potassium: 3.9 mmol/L (ref 3.5–5.1)
Sodium: 137 mmol/L (ref 135–145)
Total Bilirubin: 0.6 mg/dL (ref 0.3–1.2)
Total Protein: 7.4 g/dL (ref 6.5–8.1)

## 2022-02-09 LAB — CBC WITH DIFFERENTIAL/PLATELET
Abs Immature Granulocytes: 0.02 10*3/uL (ref 0.00–0.07)
Basophils Absolute: 0.1 10*3/uL (ref 0.0–0.1)
Basophils Relative: 2 %
Eosinophils Absolute: 0.2 10*3/uL (ref 0.0–0.5)
Eosinophils Relative: 3 %
HCT: 36.9 % — ABNORMAL LOW (ref 39.0–52.0)
Hemoglobin: 12.7 g/dL — ABNORMAL LOW (ref 13.0–17.0)
Immature Granulocytes: 0 %
Lymphocytes Relative: 29 %
Lymphs Abs: 2.1 10*3/uL (ref 0.7–4.0)
MCH: 25.6 pg — ABNORMAL LOW (ref 26.0–34.0)
MCHC: 34.4 g/dL (ref 30.0–36.0)
MCV: 74.4 fL — ABNORMAL LOW (ref 80.0–100.0)
Monocytes Absolute: 0.8 10*3/uL (ref 0.1–1.0)
Monocytes Relative: 11 %
Neutro Abs: 4 10*3/uL (ref 1.7–7.7)
Neutrophils Relative %: 55 %
Platelets: 481 10*3/uL — ABNORMAL HIGH (ref 150–400)
RBC: 4.96 MIL/uL (ref 4.22–5.81)
RDW: 14.5 % (ref 11.5–15.5)
WBC: 7.1 10*3/uL (ref 4.0–10.5)
nRBC: 0 % (ref 0.0–0.2)

## 2022-02-09 LAB — HEPATITIS PANEL, ACUTE
HCV Ab: NONREACTIVE
Hep A IgM: NONREACTIVE
Hep B C IgM: NONREACTIVE
Hepatitis B Surface Ag: NONREACTIVE

## 2022-02-09 LAB — POCT URINE DRUG SCREEN - MANUAL ENTRY (I-SCREEN)
POC Amphetamine UR: NOT DETECTED
POC Buprenorphine (BUP): NOT DETECTED
POC Cocaine UR: NOT DETECTED
POC Marijuana UR: NOT DETECTED
POC Methadone UR: NOT DETECTED
POC Methamphetamine UR: NOT DETECTED
POC Morphine: NOT DETECTED
POC Oxazepam (BZO): POSITIVE — AB
POC Oxycodone UR: NOT DETECTED
POC Secobarbital (BAR): NOT DETECTED

## 2022-02-09 LAB — RPR: RPR Ser Ql: NONREACTIVE

## 2022-02-09 LAB — LIPID PANEL
Cholesterol: 134 mg/dL (ref 0–200)
HDL: 57 mg/dL (ref >40)
LDL Cholesterol: 68 mg/dL (ref 0–99)
Total CHOL/HDL Ratio: 2.4 RATIO
Triglycerides: 45 mg/dL (ref <150)
VLDL: 9 mg/dL (ref 0–40)

## 2022-02-09 LAB — HIV ANTIBODY (ROUTINE TESTING W REFLEX): HIV Screen 4th Generation wRfx: NONREACTIVE

## 2022-02-09 LAB — TSH: TSH: 0.716 u[IU]/mL (ref 0.350–4.500)

## 2022-02-09 LAB — ETHANOL: Alcohol, Ethyl (B): <10 mg/dL (ref <10)

## 2022-02-09 LAB — MAGNESIUM: Magnesium: 1.9 mg/dL (ref 1.7–2.4)

## 2022-02-09 MED ORDER — OLANZAPINE 5 MG PO TBDP
5.0000 mg | ORAL_TABLET | Freq: Every day | ORAL | Status: DC
  Administered 2022-02-09: 5 mg via ORAL
  Filled 2022-02-09: qty 1

## 2022-02-09 NOTE — Discharge Instructions (Addendum)
Accepted at [facility] regional under the care of of Dr. Lovena Le MD Albina Billet. RN asked that report be called after [facility] transport has arrived

## 2022-02-09 NOTE — Progress Notes (Signed)
Patient has been denied by [facility] due to no appropriate beds available. Patient meets [facility] inpatient criteria per Almyra Free Nguyen,DO. Patient has been faxed out to the following facilities:   [facility]  [address] [place] [address] [phone]  [facility]  [place], [place] [address] [place]  [facility]  [address] [place] [address] [place]  [facility]  [address], [place] [address] [phone]  [facility]  [place] [place] [address] [place]  [facility]  [address] [place] [address] [phone]  [facility]  [address] [place] [address] [phone]  [place] [address] [phone]  [place], [place] [address] [phone] [place]  [address], [place] [address] [phone]  [facility]  [address] [place] [address] [phone]  [facility]  [address] [place] [address] [phone]  [facility]  [address], [place] [address] [phone] [facility]  [address], [place] [address] [phone] [place]  [place], [place] [address] [phone]   Mariea Clonts, MSW, LCSW-A  11:17 AM 02/09/2022

## 2022-02-09 NOTE — ED Notes (Signed)
Pt sleeping in recliner bed. Safety checks per facility protocol. Skin w/d. Resp even and unlabored.

## 2022-02-09 NOTE — ED Notes (Signed)
[facility] contacted and informed that [facility] would be unable to transport patient tonight.
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
## 2022-02-09 NOTE — ED Notes (Signed)
Pt is in the bed sleeping. Respirations are even and unlabored. No acute distress noted. Will continue to monitor for safety. 

## 2022-02-09 NOTE — ED Notes (Signed)
Left message with [facility] Department for patient transport to [facility].

## 2022-02-09 NOTE — Progress Notes (Signed)
[facility] LCSW Progress Note   02/09/2022    2:59 PM  [name]   [id number]   Type of Contact and Topic:  Psychiatric Bed Placement   Pt accepted to [place]     Patient meets inpatient criteria per [facility] [place]    The attending provider will be Qattlebaum,MD   Call report to [phone]  Anda Latina, RN @ [facility] notified.     Pt scheduled  to arrive at [place] ANYTIME.    Mariea Clonts, MSW, LCSW-A  3:00 PM 02/09/2022

## 2022-02-09 NOTE — ED Notes (Signed)
Pt sleeping at present, no distress noted, monitoring for safety. 

## 2022-02-09 NOTE — ED Notes (Signed)
[facility] transport team called back and reported that they would be unable to transport patient tonight. They reported possible tomorrow morning but it might even be Monday morning. They advised staff to call back and check on progress tomorrow morning.

## 2022-02-09 NOTE — ED Notes (Signed)
Patient reports he does not want to do an EKG at this time. Patient refused to give a urine specimen. Patient will continued to be encouraged to cooperate with staff to receive specimen and EKG.

## 2022-02-09 NOTE — ED Notes (Signed)
Pt awake, requesting snack. Speech is difficult to understand. Skin w/d. Denies SI/HI/AVH. Will continue to monitor for safety per facility protocol

## 2022-02-09 NOTE — ED Notes (Signed)
Patient A&Ox4. Patient denies SI/HI and AVH. However, patient appears to be responding to internal and external stimuli. Patient initially decline to have lab work drawn but changed his mind after receiving prn Zyprexa. Patient denies any physical complaints when asked. No acute distress noted. Support and encouragement provided. Routine safety checks conducted according to facility protocol. Encouraged patient to notify staff if thoughts of harm toward self or others arise. Patient verbalize understanding and agreement. Will continue to monitor for safety.

## 2022-02-09 NOTE — ED Notes (Signed)
Patient resting quietly in bed.  Respirations equal and unlabored, skin warm and dry, NAD. Routine safety checks conducted according to facility protocol. Will continue to monitor for safety.  

## 2022-02-09 NOTE — ED Notes (Signed)
Patient resting quietly in bed with eyes closed. Respirations equal and unlabored, skin warm and dry, NAD. Routine safety checks conducted according to facility protocol. Will continue to monitor for safety.  

## 2022-02-09 NOTE — ED Notes (Signed)
Pt anxious and disorganized. HS  and PRN med given. Per facility safety protocol will continue to monitor.

## 2022-02-10 NOTE — ED Notes (Signed)
Patient discharge via ambulatory with a steady gait in the custody of [facility] department. Respirations equal and unlabored, skin warm and dry. No acute distress noted. Belongings given to [facility].

## 2022-02-10 NOTE — ED Notes (Signed)
Pt sleeping, resp even and unlabored. No noted resp distress. Will continue to monitor for safety.

## 2022-02-10 NOTE — ED Notes (Signed)
Pt sleeping, resp even and unlabored, skin warm/dry, Will continue to monitor for safety

## 2022-02-10 NOTE — ED Notes (Signed)
Patient A&Ox4. SI/HI and AVH. Denies A/VH. Patient denies any physical complaints when asked. No acute distress noted. Support and encouragement provided. Routine safety checks conducted according to facility protocol. Encouraged patient to notify staff if thoughts of harm toward self or others arise. Patient verbalize understanding and agreement. Will continue to monitor for safety.

## 2022-02-11 LAB — HEMOGLOBIN A1C
Hgb A1c MFr Bld: 5.6 % (ref 4.8–5.6)
Mean Plasma Glucose: 114 mg/dL

## 2022-02-11 LAB — PROLACTIN: Prolactin: 10.2 ng/mL (ref 3.9–22.7)

## 2022-06-27 ENCOUNTER — Telehealth (HOSPITAL_COMMUNITY): Payer: Self-pay | Admitting: *Deleted

## 2022-06-27 NOTE — Telephone Encounter (Signed)
Calling for a rx for trazodone. Not seen here in outpatient, only seen thru emergency services. Explained we can not give him rx until he has been seen by one of our providers and offered and explained to him walk in appts mon thru fri first come first serve. He verbalized his understanding and said he would be here tomorrow.

## 2022-07-13 ENCOUNTER — Other Ambulatory Visit: Payer: Self-pay

## 2022-07-13 ENCOUNTER — Emergency Department (HOSPITAL_COMMUNITY)
Admission: EM | Admit: 2022-07-13 | Discharge: 2022-07-13 | Disposition: A | Discharge status: Home / Self Care | Payer: Medicaid Other | Attending: Emergency Medicine | Admitting: Emergency Medicine

## 2022-07-13 DIAGNOSIS — F209 Schizophrenia, unspecified: Secondary | ICD-10-CM

## 2022-07-13 DIAGNOSIS — R451 Restlessness and agitation: Secondary | ICD-10-CM | POA: Diagnosis present

## 2022-07-13 MED ORDER — OLANZAPINE 5 MG PO TBDP
15.0000 mg | ORAL_TABLET | Freq: Every day | ORAL | Status: DC
  Administered 2022-07-13: 15 mg via ORAL
  Filled 2022-07-13: qty 1

## 2022-07-13 NOTE — Discharge Instructions (Signed)
Follow-up with your psychiatrist on Monday.  Return to the emergency room if you have any worsening symptoms.  Continue taking the Zyprexa and trazodone until you clarify your medications with your psychiatrist on Monday.

## 2022-07-13 NOTE — ED Triage Notes (Signed)
Pt arrived via POV with father. Pt is incoherent, and mumbling. Pt's father states pt has been increasingly agitated, and is concerned their meds need to be adjusted.

## 2022-07-13 NOTE — ED Notes (Signed)
Plan discussed with father, father verbalized understanding and had no questions.

## 2022-07-13 NOTE — ED Notes (Signed)
Pt didn't want his temp taking

## 2022-07-13 NOTE — ED Provider Notes (Signed)
[place] EMERGENCY DEPARTMENT AT [facility] Provider Note   CSN: [id number] Arrival date & time: 07/13/22  1206     History  Chief Complaint  Patient presents with   Agitation    [name] is a 42 y.o. male.  Patient is a 42 year old male with a history of schizophrenia who in the past has been noncompliant with medications who presents with agitation and disorganized thoughts.  Patient's father is at bedside who helps provide history.  He states that his son is not taking his Zyprexa.  He does take trazodone without stealing medication that he takes.  He has been increasingly agitated and he is concerned that his medications may need to be adjusted or that he is having a side effect of the trazodone.  He has not had any recent illnesses.  No vomiting.  No headache.  Patient denies any hallucinations.  He denies any SI or HI.       Home Medications Prior to Admission medications   Medication Sig Start Date End Date Taking? Authorizing Provider  hydrOXYzine (ATARAX) 25 MG tablet Take 1 tablet (25 mg total) by mouth 3 (three) times daily as needed for anxiety. Patient not taking: Reported on 10/13/2021 08/15/21   Sarita Bottom, MD  mirtazapine (REMERON) 7.5 MG tablet Take 1 tablet (7.5 mg total) by mouth at bedtime. Patient not taking: Reported on 10/13/2021 08/15/21   Sarita Bottom, MD  OLANZapine zydis (ZYPREXA) 10 MG disintegrating tablet Take 1 tablet (10 mg total) by mouth at bedtime. Patient not taking: Reported on 10/13/2021 08/31/21   Jackelyn Poling, NP  temazepam (RESTORIL) 15 MG capsule Take 1 capsule (15 mg total) by mouth at bedtime. Patient not taking: Reported on 10/13/2021 08/15/21   Sarita Bottom, MD  valbenazine [facility]) 40 MG capsule Take 1 capsule (40 mg total) by mouth daily. Patient not taking: Reported on 10/13/2021 08/15/21   Sarita Bottom, MD      Allergies    Hydroxyzine, Mirtazapine, Olanzapine, Risperdal [risperidone], and Temazepam    Review of  Systems   Review of Systems  Unable to perform ROS: Psychiatric disorder    Physical Exam Updated Vital Signs BP 115/69 (BP Location: Right Arm)   Pulse 70   Resp 16   SpO2 99%  Physical Exam Constitutional:      Appearance: He is well-developed.  HENT:     Head: Normocephalic and atraumatic.  Eyes:     Pupils: Pupils are equal, round, and reactive to light.  Cardiovascular:     Rate and Rhythm: Normal rate and regular rhythm.     Heart sounds: Normal heart sounds.  Pulmonary:     Effort: Pulmonary effort is normal. No respiratory distress.     Breath sounds: Normal breath sounds. No wheezing or rales.  Chest:     Chest wall: No tenderness.  Abdominal:     General: Bowel sounds are normal.     Palpations: Abdomen is soft.     Tenderness: There is no abdominal tenderness. There is no guarding or rebound.  Musculoskeletal:        General: Normal range of motion.     Cervical back: Normal range of motion and neck supple.  Lymphadenopathy:     Cervical: No cervical adenopathy.  Skin:    General: Skin is warm and dry.     Findings: No rash.  Neurological:     Mental Status: He is alert.     Comments: Patient is alert  and oriented, no focal deficits noted  Psychiatric:     Comments: He is mumbling to himself but will speak louder when he gets agitated.  He appears to be oriented but is mumbling about he does not want to take his medications.  Appears to have disorganized thoughts     ED Results / Procedures / Treatments   Labs (all labs ordered are listed, but only abnormal results are displayed) Labs Reviewed  BASIC METABOLIC PANEL  CBC WITH DIFFERENTIAL/PLATELET  ETHANOL  RAPID URINE DRUG SCREEN, HOSP PERFORMED    EKG None  Radiology No results found.  Procedures Procedures    Medications Ordered in ED Medications  OLANZapine zydis (ZYPREXA) disintegrating tablet 15 mg (15 mg Oral Given 07/13/22 1449)    ED Course/ Medical Decision Making/ A&P                              Medical Decision Making Amount and/or Complexity of Data Reviewed Labs: ordered.   Patient is a 42 year old male with a history of schizophrenia who presents with some disorganized thoughts and agitation.  He is calm on my exam but does appear to have some disorganized thoughts.  Patient's dad is at bedside and states that he does get like this when his schizophrenia is acting up.  He is confused about his medications.  Currently the patient is only taking trazodone although he does have Zyprexa and other medications at home.  The father was confused about how to take them.  The only note states he are from January when he was discharged from the psychiatric facility.  At that time he was discharged on Zyprexa 15 mg and trazodone.  There is most current medications that I could see.  Patient does not have any SI or HI.  He denies any hallucinations.  He does not appear to be a harm to himself currently.  The father is at bedside.  I offered to consult psychiatry however patient is refusing any blood work or other interventions currently.  The father just wanted to clarify his medications and feels that if he gets back on his medications, his behavior will stabilize.  He feels comfortable managing the patient at home currently.  I feel that this is appropriate management.  I do not feel at this point that patient needs to be under IVC.  Patient was amenable to taking oral Zyprexa in the ED and he was given this.  Father will continue with his home Zyprexa as well as the trazodone and follow-up on Monday with his psychiatrist to further clarify his medications.  He was discharged home in good condition.  Return precautions were given.  Final Clinical Impression(s) / ED Diagnoses Final diagnoses:  Schizophrenia, unspecified type [facility])    Rx / DC Orders ED Discharge Orders     None         Rolan Bucco, MD 07/13/22 1504

## 2022-07-13 NOTE — ED Notes (Signed)
Went to collect labs pt refused he stated he don't like needles

## 2022-08-10 ENCOUNTER — Emergency Department (HOSPITAL_COMMUNITY)
Admission: EM | Admit: 2022-08-10 | Discharge: 2022-08-13 | Disposition: A | Discharge status: Other Acute Inpatient Hospital | Payer: MEDICAID | Attending: Emergency Medicine | Admitting: Emergency Medicine

## 2022-08-10 ENCOUNTER — Other Ambulatory Visit: Payer: Self-pay

## 2022-08-10 DIAGNOSIS — Z20822 Contact with and (suspected) exposure to covid-19: Secondary | ICD-10-CM | POA: Diagnosis not present

## 2022-08-10 DIAGNOSIS — F203 Undifferentiated schizophrenia: Secondary | ICD-10-CM | POA: Diagnosis not present

## 2022-08-10 DIAGNOSIS — F209 Schizophrenia, unspecified: Secondary | ICD-10-CM

## 2022-08-10 DIAGNOSIS — R4182 Altered mental status, unspecified: Secondary | ICD-10-CM | POA: Diagnosis present

## 2022-08-10 LAB — BASIC METABOLIC PANEL
Anion gap: 11 (ref 5–15)
BUN: 7 mg/dL (ref 6–20)
CO2: 26 mmol/L (ref 22–32)
Calcium: 8.9 mg/dL (ref 8.9–10.3)
Chloride: 102 mmol/L (ref 98–111)
Creatinine, Ser: 0.93 mg/dL (ref 0.61–1.24)
GFR, Estimated: >60 mL/min (ref >60)
Glucose, Bld: 132 mg/dL — ABNORMAL HIGH (ref 70–99)
Potassium: 2.4 mmol/L — CL (ref 3.5–5.1)
Sodium: 139 mmol/L (ref 135–145)

## 2022-08-10 LAB — CBC WITH DIFFERENTIAL/PLATELET
Abs Immature Granulocytes: 0.02 10*3/uL (ref 0.00–0.07)
Basophils Absolute: 0.1 10*3/uL (ref 0.0–0.1)
Basophils Relative: 1 %
Eosinophils Absolute: 0 10*3/uL (ref 0.0–0.5)
Eosinophils Relative: 0 %
HCT: 38.8 % — ABNORMAL LOW (ref 39.0–52.0)
Hemoglobin: 13.1 g/dL (ref 13.0–17.0)
Immature Granulocytes: 0 %
Lymphocytes Relative: 20 %
Lymphs Abs: 1.6 10*3/uL (ref 0.7–4.0)
MCH: 25 pg — ABNORMAL LOW (ref 26.0–34.0)
MCHC: 33.8 g/dL (ref 30.0–36.0)
MCV: 73.9 fL — ABNORMAL LOW (ref 80.0–100.0)
Monocytes Absolute: 0.5 10*3/uL (ref 0.1–1.0)
Monocytes Relative: 7 %
Neutro Abs: 5.7 10*3/uL (ref 1.7–7.7)
Neutrophils Relative %: 72 %
Platelets: 397 10*3/uL (ref 150–400)
RBC: 5.25 MIL/uL (ref 4.22–5.81)
RDW: 15.1 % (ref 11.5–15.5)
WBC: 7.9 10*3/uL (ref 4.0–10.5)
nRBC: 0 % (ref 0.0–0.2)

## 2022-08-10 LAB — RAPID URINE DRUG SCREEN, HOSP PERFORMED
Amphetamines: NOT DETECTED
Barbiturates: NOT DETECTED
Benzodiazepines: NOT DETECTED
Cocaine: NOT DETECTED
Opiates: NOT DETECTED
Tetrahydrocannabinol: NOT DETECTED

## 2022-08-10 MED ORDER — POTASSIUM CHLORIDE CRYS ER 20 MEQ PO TBCR: 20.0000 meq | EXTENDED_RELEASE_TABLET | Freq: Two times a day (BID) | ORAL | Status: DC

## 2022-08-10 MED ORDER — POTASSIUM CHLORIDE CRYS ER 20 MEQ PO TBCR
40.0000 meq | EXTENDED_RELEASE_TABLET | Freq: Once | ORAL | Status: AC
  Administered 2022-08-10: 40 meq via ORAL
  Filled 2022-08-10: qty 2

## 2022-08-10 MED ORDER — POTASSIUM CHLORIDE 10 MEQ/100ML IV SOLN: 10.0000 meq | Freq: Once | INTRAVENOUS | Status: DC

## 2022-08-10 MED ORDER — ZIPRASIDONE MESYLATE 20 MG IM SOLR
10.0000 mg | Freq: Once | INTRAMUSCULAR | Status: AC
  Administered 2022-08-10: 10 mg via INTRAMUSCULAR
  Filled 2022-08-10: qty 20

## 2022-08-10 MED ORDER — STERILE WATER FOR INJECTION IJ SOLN
INTRAMUSCULAR | Status: AC
  Filled 2022-08-10: qty 10

## 2022-08-10 MED ORDER — POTASSIUM CHLORIDE CRYS ER 20 MEQ PO TBCR
20.0000 meq | EXTENDED_RELEASE_TABLET | Freq: Two times a day (BID) | ORAL | Status: DC
  Administered 2022-08-12 – 2022-08-13 (×2): 20 meq via ORAL
  Filled 2022-08-10 (×4): qty 1

## 2022-08-10 MED ORDER — POTASSIUM CHLORIDE CRYS ER 20 MEQ PO TBCR: 40.0000 meq | EXTENDED_RELEASE_TABLET | Freq: Once | ORAL | Status: DC

## 2022-08-10 NOTE — ED Notes (Signed)
Pt currently sleeping, pt attempted urine and missed and hit the floor, will try again later.

## 2022-08-10 NOTE — ED Triage Notes (Signed)
Patient brought in by his dad who states patient has been off his Trazodone for 2 months. Per his dad he denies SI/HI, mild hallucinations and paranoid.

## 2022-08-10 NOTE — ED Notes (Signed)
Father has Pt. belongings
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
## 2022-08-10 NOTE — ED Notes (Signed)
Patient is refusing labs at this moment

## 2022-08-10 NOTE — ED Provider Notes (Addendum)
[place] EMERGENCY DEPARTMENT AT [facility] Provider Note   CSN: [id number] Arrival date & time: 08/10/22  1628     History Chief Complaint  Patient presents with   Altered Mental Status    HPI [name] is a 42 y.o. male   Patient is a 42 year old male with a history of schizophrenia who in the past has been noncompliant with medications who presents with altered mental status.  Patient's father is at bedside who helps provide history.  He states that his son has  not been taking any of his medicines for about 1-2 months.   He has been increasingly agitated and verbally abusive to people around him.  He has not had any recent illnesses. No vomiting. No headache. Patient denies any hallucinations. He denies any SI or HI.   He was brought to the ED due to danger to himself and others.  Patient's recorded medical, surgical, social, medication list and allergies were reviewed in the Snapshot window as part of the initial history.   Review of Systems   Review of Systems  Unable to perform ROS: Psychiatric disorder   Physical Exam Updated Vital Signs BP (!) 153/104 (BP Location: Right Arm)   Pulse 96   Temp 98 F (36.7 C) (Oral)   Resp 18   Ht 5\' 11"  (1.803 m)   Wt 63.5 kg   SpO2 100%   BMI 19.53 kg/m  Physical Exam Unable to perform due to acute decompensation.  Psych: He is mumbling to himself. He appears to be alert and oriented. Appears to have disorganized thoughts   ED Course/ Medical Decision Making/ A&P Clinical Course as of 08/10/22 2132  Sat Aug 10, 2022  1650 Stable (Resident)  [CC]  1721 49 YOM with a history of AMS HX of Schizophrenia. Noncompliant with medications. Increasing hallucinations and paranoia. Family feels that the patient is high risk. He has been wandering into traffic. Supposed to be on zyprexa. [CC]    Clinical Course User Index [CC] Glyn Ade, MD    Procedures Procedures   Medications Ordered in  ED Medications  potassium chloride SA (KLOR-CON M) CR tablet 20 mEq (has no administration in time range)  ziprasidone (GEODON) injection 10 mg (10 mg Intramuscular Given 08/10/22 1746)  sterile water (preservative free) injection (  Given 08/10/22 1746)  potassium chloride SA (KLOR-CON M) CR tablet 40 mEq (40 mEq Oral Given 08/10/22 2024)    Medical Decision Making:    [name] is a 42 y.o. male who presented to the ED today with altered mental status due to medication noncompliance.  Additional history discussed with patient's family/caregivers.   Complete initial physical exam performed, notably the patient  was alert and oriented, however mumbling words to himself with disorganized thoughts.      Reviewed and confirmed nursing documentation for past medical history, family history, social history.    Initial Assessment:   With the patient's presentation of altered mental status, most likely diagnosis is AMS secondary to medication noncompliance. Other diagnoses were considered including meningitis vs intracranial mass. These are considered less likely due to history of present illness and physical exam findings.  On exam patient he is agitated and refusing meds. This is most consistent with an acute life/limb threatening illness complicated by underlying chronic conditions.  Initial Plan:  Screening labs including CBC and Metabolic panel to evaluate for infectious or metabolic etiology of disease.  Urine drug screen EKG  IM ziprasidone for agitation.  Involuntary commitment(IVC)  Objective evaluation as below reviewed with plan for close reassessment  Initial Study Results:   Laboratory  All laboratory results reviewed without evidence of clinically relevant pathology.   Exceptions include: Potassium low at 2.4   EKG was reviewed independently. Rate, rhythm, axis, intervals all examined and without medically relevant abnormality. ST segments without concerns for elevations.      Reassessment and Plan:   He is calm and cooperative after IM ziprasidone.  He received oral KCl 40 mg.  Will continue with oral KCl 20 mg twice daily starting 7/7 for 5 doses.  Initial consultation for psychiatry placed.   Clinical Impression:  1. Schizophrenia, unspecified type (HCC)      Data Unavailable   Final Clinical Impression(s) / ED Diagnoses Final diagnoses:  Schizophrenia, unspecified type [facility])    Rx / DC Orders ED Discharge Orders     None         Laretta Bolster, MD 08/10/22 2121    Laretta Bolster, MD 08/10/22 2133    Glyn Ade, MD 08/10/22 2258

## 2022-08-10 NOTE — ED Notes (Signed)
Pt moved into a private room to do psych assessment.  Cart in room
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
## 2022-08-10 NOTE — ED Notes (Signed)
Pt refused EKG.

## 2022-08-11 DIAGNOSIS — R4182 Altered mental status, unspecified: Secondary | ICD-10-CM | POA: Diagnosis present

## 2022-08-11 DIAGNOSIS — F203 Undifferentiated schizophrenia: Secondary | ICD-10-CM

## 2022-08-11 LAB — SARS CORONAVIRUS 2 BY RT PCR: SARS Coronavirus 2 by RT PCR: NEGATIVE

## 2022-08-11 MED ORDER — STERILE WATER FOR INJECTION IJ SOLN
INTRAMUSCULAR | Status: AC
  Filled 2022-08-11: qty 10

## 2022-08-11 MED ORDER — VALBENAZINE TOSYLATE 40 MG PO CAPS
40.0000 mg | ORAL_CAPSULE | Freq: Every day | ORAL | Status: DC
  Administered 2022-08-11 – 2022-08-13 (×2): 40 mg via ORAL
  Filled 2022-08-11 (×3): qty 1

## 2022-08-11 MED ORDER — ZIPRASIDONE MESYLATE 20 MG IM SOLR
20.0000 mg | Freq: Once | INTRAMUSCULAR | Status: AC
  Administered 2022-08-11: 20 mg via INTRAMUSCULAR
  Filled 2022-08-11: qty 20

## 2022-08-11 MED ORDER — HALOPERIDOL 5 MG PO TABS
5.0000 mg | ORAL_TABLET | Freq: Every day | ORAL | Status: DC
  Administered 2022-08-11: 5 mg via ORAL
  Filled 2022-08-11 (×2): qty 1

## 2022-08-11 MED ORDER — TRAZODONE HCL 50 MG PO TABS
50.0000 mg | ORAL_TABLET | Freq: Every day | ORAL | Status: DC
  Administered 2022-08-11: 50 mg via ORAL
  Filled 2022-08-11: qty 1

## 2022-08-11 NOTE — ED Notes (Signed)
Pt refused his medication. COVID swab required for placement at [facility]. Swab completed. Awaiting results

## 2022-08-11 NOTE — ED Provider Notes (Signed)
Emergency Medicine Observation Re-evaluation Note  [name] is a 42 y.o. male, seen on rounds today.  Pt initially presented to the ED for complaints of Altered Mental Status Currently, the patient is asleep.  Pt presented yesterday with schizophrenia and medical noncompliance.  He was wandering into traffic.  Family concerned pt is a risk to himself.  IVC papers done.  Inpatient psych placement recommended.  Physical Exam  BP 113/73 (BP Location: Right Arm)   Pulse 72   Temp 97.7 F (36.5 C) (Oral)   Resp 18   Ht 5\' 11"  (1.803 m)   Wt 63.5 kg   SpO2 100%   BMI 19.53 kg/m  Physical Exam General: asleep Cardiac: rr Lungs: clear Psych: calm  ED Course / MDM  EKG:EKG Interpretation Date/Time:  Saturday August 10 2022 19:37:27 EDT Ventricular Rate:  62 PR Interval:  140 QRS Duration:  102 QT Interval:  420 QTC Calculation: 426 R Axis:   63  Text Interpretation: Normal sinus rhythm Normal ECG When compared with ECG of 13-Oct-2021 13:46, PREVIOUS ECG IS PRESENT Confirmed by Glyn Ade [phone]) on 08/10/2022 8:52:47 PM  I have reviewed the labs performed to date as well as medications administered while in observation.  Recent changes in the last 24 hours include none.  Plan  Current plan is for inpatient placement.    Jacalyn Lefevre, MD 08/11/22 [phone]

## 2022-08-11 NOTE — Consult Note (Cosign Needed Addendum)
[facility] ED ASSESSMENT   Reason for Consult:  Psychiatry evaluation Referring Physician:  ER Physician Patient Identification: [name] MRN:  [id number] ED Chief Complaint: Undifferentiated schizophrenia [facility])  Diagnosis:  Principal Problem:   Undifferentiated schizophrenia (HCC) Active Problems:   Altered mental status, unspecified   ED Assessment Time Calculation: Start Time: 1914 Stop Time: 1933 Total Time in Minutes (Assessment Completion): 19   Subjective:   [name] is a 42 y.o. male patient admitted with hx Schizophrenia brought to the ER by his father. Per  Triage Note ". Patient has been wandering in and out of ED lobby and parking lot for approximately 30 minutes. Father wanting him checked in for psychosis. [employer] officer and security aware. Toni Arthurs, EMT overheard patient say he wanted to shoot himself and die. Toni Arthurs, EMT encouraged patient in parking lot and was able to get him back in lobby so father could register him. [employer] officer, security and charge nurse Felipa Furnace RN informed that patient is in lobby with father and needs a bed in the department, but is refusing to come back to triage/treatment area. Dr. Particia Nearing and [name] informed as well. Patient remains in lobby at this time with father"  HPI:  Several attempts to speak to patient failed.  He is constantly talking to himself and pacing around in his room  He is not making any eye contact with staff he is not stopping to speak with anybody.  Per chart review patient has extensive hx of Schizophrenia, Tardive Dyskinesia and Hydrocephalus  SP shunt.  He has hx of multiple hospitalizations in the past..  He was hospitalized at [facility] in 2023 and 2024 and multiple [facility] visits and [facility] as well.  Per his father patient is known for not taking his Medications and father also states he does not know how to give his son his medications.  As patient is walking and talking his speech is garbled and difficult to  understand.  At this time patient meets criteria for inpatient Psychiatric hospitalization.  We have faxed out record to hospitals for available beds.  Haldol is prescribed for night time with Trazodone for sleep and Ingrezza for Tardive Dyskinesia.  Past Psychiatric History: hx of Schizophrenia, Tardive Dyskinesia and Hydrocephalus  SP shunt.   Multiple inpatient hospitalizations, Multiple ER Visits including [facility].  Patient is non compliant with his medications.  Risk to Self or Others: Is the patient at risk to self? No Has the patient been a risk to self in the past 6 months? No Has the patient been a risk to self within the distant past? No Is the patient a risk to others? No Has the patient been a risk to others in the past 6 months? No Has the patient been a risk to others within the distant past? No  [place] Scale:  Flowsheet Row ED from 08/10/2022 in [facility] Emergency Department at [facility] ED from 07/13/2022 in [facility] Emergency Department at [facility] ED from 02/08/2022 in [facility]  C-SSRS RISK CATEGORY No Risk No Risk No Risk       AIMS:  , , ,  ,   ASAM:    Substance Abuse:  Alcohol / Drug Use Pain Medications: see MAR Prescriptions: Pt not taking meds.  He did say he had prescription for Zyprexa. Over the Counter: see MAR History of alcohol / drug use?: No history of alcohol / drug abuse  Past Medical  History:  Past Medical History:  Diagnosis Date   Schizophrenia [facility])     Past Surgical History:  Procedure Laterality Date   shunt surgery     Family History:  Family History  Problem Relation Age of Onset   Stroke Mother    Thyroid disease Father    Family Psychiatric  History: Unknown Psychiatry illness for maternal aunt. Social History:  Social History   Substance and Sexual Activity  Alcohol Use Not Currently     Social History   Substance and Sexual Activity  Drug Use Not Currently    Types: Marijuana    Social History   Socioeconomic History   Marital status: Single    Spouse name: Not on file   Number of children: Not on file   Years of education: Not on file   Highest education level: Not on file  Occupational History   Not on file  Tobacco Use   Smoking status: Every Day    Types: Cigars   Smokeless tobacco: Never  Vaping Use   Vaping Use: Former  Substance and Sexual Activity   Alcohol use: Not Currently   Drug use: Not Currently    Types: Marijuana   Sexual activity: Not on file  Other Topics Concern   Not on file  Social History Narrative   Not on file   Social Determinants of Health   Financial Resource Strain: Not on file  Food Insecurity: Not on file  Transportation Needs: Not on file  Physical Activity: Not on file  Stress: Not on file  Social Connections: Not on file   Additional Social History:    Allergies:   Allergies  Allergen Reactions   Shellfish-Derived Products Anaphylaxis   Hydroxyzine Other (See Comments)    Made the patient "too lethargic" (also may have caused Tardive dyskinesia)   Mirtazapine Other (See Comments)    Made the patient "too lethargic" (also may have caused Tardive dyskinesia)   Olanzapine Other (See Comments)    Made the patient "too lethargic" (also may have caused Tardive dyskinesia)   Risperdal [Risperidone] Other (See Comments)    Tardive dyskinesia   Temazepam Other (See Comments)    Made the patient "too lethargic" (also may have caused Tardive dyskinesia)    Labs:  Results for orders placed or performed during the hospital encounter of 08/10/22 (from the past 48 hour(s))  CBC with Differential     Status: Abnormal   Collection Time: 08/10/22  6:24 PM  Result Value Ref Range   WBC 7.9 4.0 - 10.5 K/uL   RBC 5.25 4.22 - 5.81 MIL/uL   Hemoglobin 13.1 13.0 - 17.0 g/dL   HCT 16.1 (L) 09.6 - 04.5 %   MCV 73.9 (L) 80.0 - 100.0 fL   MCH 25.0 (L) 26.0 - 34.0 pg   MCHC 33.8 30.0 - 36.0 g/dL    RDW 40.9 81.1 - 91.4 %   Platelets 397 150 - 400 K/uL   nRBC 0.0 0.0 - 0.2 %   Neutrophils Relative % 72 %   Neutro Abs 5.7 1.7 - 7.7 K/uL   Lymphocytes Relative 20 %   Lymphs Abs 1.6 0.7 - 4.0 K/uL   Monocytes Relative 7 %   Monocytes Absolute 0.5 0.1 - 1.0 K/uL   Eosinophils Relative 0 %   Eosinophils Absolute 0.0 0.0 - 0.5 K/uL   Basophils Relative 1 %   Basophils Absolute 0.1 0.0 - 0.1 K/uL   Immature Granulocytes 0 %   Abs  Immature Granulocytes 0.02 0.00 - 0.07 K/uL    Comment: Performed at [facility], [address], [place], [place] [address]  Basic metabolic panel     Status: Abnormal   Collection Time: 08/10/22  6:24 PM  Result Value Ref Range   Sodium 139 135 - 145 mmol/L   Potassium 2.4 (LL) 3.5 - 5.1 mmol/L    Comment: CRITICAL RESULT CALLED TO, READ BACK BY AND VERIFIED WITH Gwenette Greet RN @ 1928 08/10/22 MCLEAN K.    Chloride 102 98 - 111 mmol/L   CO2 26 22 - 32 mmol/L   Glucose, Bld 132 (H) 70 - 99 mg/dL    Comment: Glucose reference range applies only to samples taken after fasting for at least 8 hours.   BUN 7 6 - 20 mg/dL   Creatinine, Ser 6.04 0.61 - 1.24 mg/dL   Calcium 8.9 8.9 - 54.0 mg/dL   GFR, Estimated >98 >11 mL/min    Comment: (NOTE) Calculated using the CKD-EPI Creatinine Equation (2021)    Anion gap 11 5 - 15    Comment: Performed at [facility], [address] [place], [place] [address]  Rapid urine drug screen (hospital performed)     Status: None   Collection Time: 08/10/22  6:53 PM  Result Value Ref Range   Opiates NONE DETECTED NONE DETECTED   Cocaine NONE DETECTED NONE DETECTED   Benzodiazepines NONE DETECTED NONE DETECTED   Amphetamines NONE DETECTED NONE DETECTED   Tetrahydrocannabinol NONE DETECTED NONE DETECTED   Barbiturates NONE DETECTED NONE DETECTED    Comment: (NOTE) DRUG SCREEN FOR MEDICAL PURPOSES ONLY.  IF CONFIRMATION IS NEEDED FOR ANY PURPOSE, NOTIFY LAB WITHIN 5 DAYS.  LOWEST  DETECTABLE LIMITS FOR URINE DRUG SCREEN Drug Class                     Cutoff (ng/mL) Amphetamine and metabolites    1000 Barbiturate and metabolites    200 Benzodiazepine                 200 Opiates and metabolites        300 Cocaine and metabolites        300 THC                            50 Performed at [facility], [address], [place], [place] [address]     Current Facility-Administered Medications  Medication Dose Route Frequency Provider Last Rate Last Admin   haloperidol (HALDOL) tablet 5 mg  5 mg Oral QHS Sable Knoles C, NP       potassium chloride SA (KLOR-CON M) CR tablet 20 mEq  20 mEq Oral BID Laretta Bolster, MD       traZODone (DESYREL) tablet 50 mg  50 mg Oral QHS Tameeka Luo C, NP       valbenazine (INGREZZA) capsule 40 mg  40 mg Oral Daily Dahlia Byes C, NP       Current Outpatient Medications  Medication Sig Dispense Refill   hydrOXYzine (ATARAX) 25 MG tablet Take 1 tablet (25 mg total) by mouth 3 (three) times daily as needed for anxiety. (Patient not taking: Reported on 10/13/2021) 30 tablet 0   mirtazapine (REMERON) 7.5 MG tablet Take 1 tablet (7.5 mg total) by mouth at bedtime. (Patient not taking: Reported on 10/13/2021) 30 tablet 0   OLANZapine (ZYPREXA) 15 MG tablet Take 15  mg by mouth at bedtime. (Patient not taking: Reported on 08/11/2022)     OLANZapine zydis (ZYPREXA) 10 MG disintegrating tablet Take 1 tablet (10 mg total) by mouth at bedtime. (Patient not taking: Reported on 10/13/2021) 30 tablet 1   temazepam (RESTORIL) 15 MG capsule Take 1 capsule (15 mg total) by mouth at bedtime. (Patient not taking: Reported on 10/13/2021) 30 capsule 0   traZODone (DESYREL) 100 MG tablet Take 100 mg by mouth at bedtime. (Patient not taking: Reported on 08/11/2022)     valbenazine (INGREZZA) 40 MG capsule Take 1 capsule (40 mg total) by mouth daily. (Patient not taking: Reported on 10/13/2021) 30 capsule 0    Musculoskeletal: Strength &  Muscle Tone: within normal limits Gait & Station: normal Patient leans: Front   Psychiatric Specialty Exam: Presentation  General Appearance:  Casual; Disheveled; Other (comment) (Emaciated, ill looking)  Eye Contact: None  Speech: Other (comment) (incoherent, jibbrish)  Speech Volume: Normal  Handedness: Right   Mood and Affect  Mood: -- (unable to respond to this question, talking nonsensical)  Affect: Constricted   Thought Process  Thought Processes: Disorganized; Irrevelant  Descriptions of Associations:Circumstantial  Orientation:None  Thought Content:Illogical  History of Schizophrenia/Schizoaffective disorder:Yes  Duration of Psychotic Symptoms:Greater than six months  Hallucinations:Hallucinations: Auditory Description of Auditory Hallucinations: Talking to self, continuously talking and moving around in his room, responding to something  Ideas of Reference:-- (unable to assess)  Suicidal Thoughts:Suicidal Thoughts: -- (unable to assess, not interacting pacing)  Homicidal Thoughts:No data recorded  Sensorium  Memory: Other (comment) (unable to assess, pacing around and talking to self and to unseen people.)  Judgment: -- (unable to assess, pacing around and talking to self and to unseen people.)  Insight: -- (unable to assess, pacing around and talking to self and to unseen people.)   Executive Functions  Concentration: Poor  Attention Span: Poor  Recall: Poor  Fund of Knowledge: Poor  Language: Poor   Psychomotor Activity  Psychomotor Activity: Psychomotor Activity: Restlessness; Increased   Assets  Assets: Housing    Sleep  Sleep: Sleep: -- (unable to assess, pacing around and talking to self and to unseen people.)   Physical Exam: Physical Exam  unable to obtain, patient cannot sit for evaluation ROS unable to obtain/assess.  Patient is non contributory Blood pressure 129/89, pulse 67, temperature 98.1 F  (36.7 C), temperature source Oral, resp. rate 19, height 5\' 11"  (1.803 m), weight 63.5 kg, SpO2 99 %. Body mass index is 19.53 kg/m.  Medical Decision Making: Patient meets criteria for inpatient Psychiatric hospitalization.  We have faxed out record to hospitals for available beds.  Haldol is prescribed for night time with Trazodone for sleep and Ingrezza for Tardive Dyskinesia.  Problem 1: Undifferentiated Schizophrenia  Problem 2: Altered Mental Status, unspecified.  Disposition:  admit seek bed placement.  Earney Navy, NP-PMHNP-BC 08/11/2022 7:35 PM

## 2022-08-11 NOTE — Progress Notes (Signed)
Patient has been denied by [facility] due to no appropriate beds available. Patient meets BH inpatient criteria per Dahlia Byes, NP. Patient has been faxed out to the following facilities:   [facility]  [address] [place]., [place] [address] [phone]  [facility]  [address] [place] [address] [phone]  [facility]  [address], [place] [address] [phone]  [facility]  [address] [place] [address] [phone]  [facility]  [address] [place] [address] [phone]  [facility]  [address], [place] [address] [phone]  [facility]  [address] [place] [address] [phone]  [facility]  [address] [place], [place] [address] [phone]  [facility]  [address] [place], [place] [address] [phone]  [facility]  [address] [place] [address] [phone]  [facility]  [address] [place] [address] [phone]  [facility]  [address], [place] [address] [phone]  [facility]  [address], [place] [address] [phone]   Damita Dunnings, MSW, LCSW-A  1:14 PM 08/11/2022

## 2022-08-11 NOTE — ED Notes (Signed)
[facility] called to notify of negative covid swab. Awaiting decision from [facility] to see if pt will be accepted at facility

## 2022-08-11 NOTE — ED Notes (Signed)
Patient refused to take his Potassium Chloride. I attempted to explain what it was for, he still refused

## 2022-08-11 NOTE — ED Notes (Signed)
Pt refuses to take any medication. Told pt his potassium was low and he said it was not low.

## 2022-08-12 LAB — POTASSIUM: Potassium: 3.7 mmol/L (ref 3.5–5.1)

## 2022-08-12 MED ORDER — LORAZEPAM 2 MG/ML IJ SOLN: 2.0000 mg | Freq: Once | INTRAMUSCULAR | Status: DC

## 2022-08-12 MED ORDER — HALOPERIDOL 1 MG PO TABS
2.0000 mg | ORAL_TABLET | Freq: Every day | ORAL | Status: DC
  Administered 2022-08-12: 2 mg via ORAL
  Filled 2022-08-12 (×2): qty 2

## 2022-08-12 MED ORDER — ZIPRASIDONE MESYLATE 20 MG IM SOLR: 20.0000 mg | Freq: Once | INTRAMUSCULAR | Status: DC

## 2022-08-12 NOTE — ED Notes (Signed)
Patient alert this shift. Patient was medication compliant. Patient did take K and the ordered Haldol(see MAR).   Patient continues to actively hallucinate and talk to people not there. Patient argues and becomes upset will hallucinating.   Patient did eat and is redirectable.Marland Kitchen

## 2022-08-12 NOTE — ED Notes (Signed)
Patient has intermittent episodes where he raises his voice as it appears that he could be experiencing AH.  When attempting to deescalate patient, patient stated, "No one cares about me.  I should have shot myself."  Patient continues to refuse his potassium.  Patient asked for something to drink but declined anything as it appears he is paranoid about [place] putting something in his drink.

## 2022-08-12 NOTE — Progress Notes (Signed)
Patient has been denied by [facility] due to no appropriate beds available. Patient meets BH inpatient criteria per Fayette Pho, NP. Patient has been faxed out to the following facilities:   [facility]  [address] [place]., [place] [address] [phone]  [facility]  [address] [place] [address] [phone]  [facility]  [address] [place] [address] [phone]  [facility]  [address] [place] [address] [phone]  [facility]  [address] [place] [address] [phone]  [facility]  [address], [place] [address] [phone]  [facility]  [address] [place] [address] [phone]  [facility]  [address] [place], [place] [address] [phone]  [facility]  [address] [place], [place] [address] [phone]  [facility]  [address] [place] [address] [phone]  [facility]  [address] [place] [address] [phone]  [facility]  [address], [place] [address] [phone]  [facility]  [address], [place] [address] [phone]  [facility]  [address] [place] [address] [phone]  [facility]  [address], [place] [address] [phone]  [facility]  [address] [place], [place] [address] [phone]  [facility] [address] [place]  [address] [place] [address] [phone]  [facility] [place] [address] [phone]  [facility]  [address] [place], [place] [address] [phone]  [facility]  [address] [place] [address] [phone]  [facility]  [address], [place] [address] [phone]  [facility]  [address] [place] [address] [phone]  [facility]  [address], [place] [address] [phone]  [facility]  [address], [place] [address] [phone]  [facility]  [address] [place] [address] [phone]  [facility]  [address], [place] [address] [phone]  [facility]  [address] [place] [address] [phone]   Damita Dunnings, MSW, LCSW-A  7:15 PM 08/12/2022

## 2022-08-12 NOTE — Progress Notes (Signed)
[facility] Psych ED Progress Note  08/12/2022 11:47 AM [name]  MRN:  [id number]   Subjective:  [name] is a 42 y.o. male patient admitted with hx Schizophrenia brought to the ER by his father. Per  Triage Note ". Patient has been wandering in and out of ED lobby and parking lot for approximately 30 minutes. Father wanting him checked in for psychosis. [employer] officer and security aware. [name], EMT overheard patient say he wanted to shoot himself and die. [name], EMT encouraged patient in parking lot and was able to get him back in lobby so father could register him. [employer] officer, security and charge nurse [name] informed that patient is in lobby with father and needs a bed in the department, but is refusing to come back to triage/treatment area. [name] and [name] informed as well. Patient remains in lobby at this time with father"  Patient was loud, confused and disorganized last night.  He appeared to be responding to IS and needed frequent redirecting.  This morning he refused V/S but accepted his Potassium for replacement.  We will repeat Potassium level in two hours.  This is a condition for [facility] to take him tomorrow.  He has been accepted and bed assigned but need Potassium to be replaced.  Patient intermittently yells out but is easily redirected and patient does  engage in meaningful conversation.this morning stating he is ok and making hand gestures in the air.  Patient denies SI/HI/AVH but intermittently talking to unseen people.  As stated plan is to transfer him to [facility] tomorrow morning. Principal Problem: Undifferentiated schizophrenia (HCC) Diagnosis:  Principal Problem:   Undifferentiated schizophrenia (HCC) Active Problems:   Altered mental status, unspecified   ED Assessment Time Calculation: Start Time: 1914 Stop Time: 1933 Total Time in Minutes (Assessment Completion): 19   Past Psychiatric History: see initial Psychiatry evaluation note  [name]  Scale:  Flowsheet Row ED from 08/10/2022 in [facility] Emergency Department at [facility] ED from 07/13/2022 in [facility] Emergency Department at [facility] ED from 02/08/2022 in [facility]  C-SSRS RISK CATEGORY No Risk No Risk No Risk       Past Medical History:  Past Medical History:  Diagnosis Date   Schizophrenia [facility])     Past Surgical History:  Procedure Laterality Date   shunt surgery     Family History:  Family History  Problem Relation Age of Onset   Stroke Mother    Thyroid disease Father    Family Psychiatric  History: see initial Psychiatry evaluation note Social History:  Social History   Substance and Sexual Activity  Alcohol Use Not Currently     Social History   Substance and Sexual Activity  Drug Use Not Currently   Types: Marijuana    Social History   Socioeconomic History   Marital status: Single    Spouse name: Not on file   Number of children: Not on file   Years of education: Not on file   Highest education level: Not on file  Occupational History   Not on file  Tobacco Use   Smoking status: Every Day    Types: Cigars   Smokeless tobacco: Never  Vaping Use   Vaping Use: Former  Substance and Sexual Activity   Alcohol use: Not Currently   Drug use: Not Currently    Types: Marijuana   Sexual activity: Not on file  Other Topics Concern  Not on file  Social History Narrative   Not on file   Social Determinants of Health   Financial Resource Strain: Not on file  Food Insecurity: Not on file  Transportation Needs: Not on file  Physical Activity: Not on file  Stress: Not on file  Social Connections: Not on file    Sleep: Fair  Appetite:  Fair  Current Medications: Current Facility-Administered Medications  Medication Dose Route Frequency Provider Last Rate Last Admin   haloperidol (HALDOL) tablet 2 mg  2 mg Oral Q1200 Tyler Monks C, Solis       haloperidol (HALDOL) tablet 5  mg  5 mg Oral QHS Tyler Fickett C, Solis   5 mg at 08/11/22 2244   LORazepam (ATIVAN) injection 2 mg  2 mg Intramuscular Once [name]       potassium chloride SA (KLOR-CON M) CR tablet 20 mEq  20 mEq Oral BID [name]   20 mEq at 08/12/22 1124   traZODone (DESYREL) tablet 50 mg  50 mg Oral QHS Tyler Preis C, Solis   50 mg at 08/11/22 2244   valbenazine (INGREZZA) capsule 40 mg  40 mg Oral Daily Tyler Byes C, Solis   40 mg at 08/11/22 2244   ziprasidone (GEODON) injection 20 mg  20 mg Intramuscular Once [name]       Current Outpatient Medications  Medication Sig Dispense Refill   hydrOXYzine (ATARAX) 25 MG tablet Take 1 tablet (25 mg total) by mouth 3 (three) times daily as needed for anxiety. (Patient not taking: Reported on 10/13/2021) 30 tablet 0   mirtazapine (REMERON) 7.5 MG tablet Take 1 tablet (7.5 mg total) by mouth at bedtime. (Patient not taking: Reported on 10/13/2021) 30 tablet 0   OLANZapine (ZYPREXA) 15 MG tablet Take 15 mg by mouth at bedtime. (Patient not taking: Reported on 08/11/2022)     OLANZapine zydis (ZYPREXA) 10 MG disintegrating tablet Take 1 tablet (10 mg total) by mouth at bedtime. (Patient not taking: Reported on 10/13/2021) 30 tablet 1   temazepam (RESTORIL) 15 MG capsule Take 1 capsule (15 mg total) by mouth at bedtime. (Patient not taking: Reported on 10/13/2021) 30 capsule 0   traZODone (DESYREL) 100 MG tablet Take 100 mg by mouth at bedtime. (Patient not taking: Reported on 08/11/2022)     valbenazine (INGREZZA) 40 MG capsule Take 1 capsule (40 mg total) by mouth daily. (Patient not taking: Reported on 10/13/2021) 30 capsule 0    Lab Results:  Results for orders placed or performed during the hospital encounter of 08/10/22 (from the past 48 hour(s))  CBC with Differential     Status: Abnormal   Collection Time: 08/10/22  6:24 PM  Result Value Ref Range   WBC 7.9 4.0 - 10.5 K/uL   RBC 5.25 4.22 - 5.81 MIL/uL   Hemoglobin 13.1  13.0 - 17.0 g/dL   HCT 16.1 (L) 09.6 - 04.5 %   MCV 73.9 (L) 80.0 - 100.0 fL   MCH 25.0 (L) 26.0 - 34.0 pg   MCHC 33.8 30.0 - 36.0 g/dL   RDW 40.9 81.1 - 91.4 %   Platelets 397 150 - 400 K/uL   nRBC 0.0 0.0 - 0.2 %   Neutrophils Relative % 72 %   Neutro Abs 5.7 1.7 - 7.7 K/uL   Lymphocytes Relative 20 %   Lymphs Abs 1.6 0.7 - 4.0 K/uL   Monocytes Relative 7 %   Monocytes Absolute 0.5 0.1 - 1.0  K/uL   Eosinophils Relative 0 %   Eosinophils Absolute 0.0 0.0 - 0.5 K/uL   Basophils Relative 1 %   Basophils Absolute 0.1 0.0 - 0.1 K/uL   Immature Granulocytes 0 %   Abs Immature Granulocytes 0.02 0.00 - 0.07 K/uL    Comment: Performed at [facility], [address] [place], [place] [address]  Basic metabolic panel     Status: Abnormal   Collection Time: 08/10/22  6:24 PM  Result Value Ref Range   Sodium 139 135 - 145 mmol/L   Potassium 2.4 (LL) 3.5 - 5.1 mmol/L    Comment: CRITICAL RESULT CALLED TO, READ BACK BY AND VERIFIED WITH Gwenette Greet Solis @ 1928 08/10/22 MCLEAN K.    Chloride 102 98 - 111 mmol/L   CO2 26 22 - 32 mmol/L   Glucose, Bld 132 (H) 70 - 99 mg/dL    Comment: Glucose reference range applies only to samples taken after fasting for at least 8 hours.   BUN 7 6 - 20 mg/dL   Creatinine, Ser 6.21 0.61 - 1.24 mg/dL   Calcium 8.9 8.9 - 30.8 mg/dL   GFR, Estimated >65 >78 mL/min    Comment: (NOTE) Calculated using the CKD-EPI Creatinine Equation (2021)    Anion gap 11 5 - 15    Comment: Performed at [facility], [address], [place], [place] [address]  Rapid urine drug screen (hospital performed)     Status: None   Collection Time: 08/10/22  6:53 PM  Result Value Ref Range   Opiates NONE DETECTED NONE DETECTED   Cocaine NONE DETECTED NONE DETECTED   Benzodiazepines NONE DETECTED NONE DETECTED   Amphetamines NONE DETECTED NONE DETECTED   Tetrahydrocannabinol NONE DETECTED NONE DETECTED   Barbiturates NONE DETECTED NONE DETECTED     Comment: (NOTE) DRUG SCREEN FOR MEDICAL PURPOSES ONLY.  IF CONFIRMATION IS NEEDED FOR ANY PURPOSE, NOTIFY LAB WITHIN 5 DAYS.  LOWEST DETECTABLE LIMITS FOR URINE DRUG SCREEN Drug Class                     Cutoff (ng/mL) Amphetamine and metabolites    1000 Barbiturate and metabolites    200 Benzodiazepine                 200 Opiates and metabolites        300 Cocaine and metabolites        300 THC                            50 Performed at [facility], [address] [place], [place] [address]   SARS Coronavirus 2 by RT PCR (hospital order, performed in [facility] hospital lab) *cepheid single result test* Anterior Nasal Swab     Status: None   Collection Time: 08/11/22  7:30 PM   Specimen: Anterior Nasal Swab  Result Value Ref Range   SARS Coronavirus 2 by RT PCR NEGATIVE NEGATIVE    Comment: (NOTE) SARS-CoV-2 target nucleic acids are NOT DETECTED.  The SARS-CoV-2 RNA is generally detectable in upper and lower respiratory specimens during the acute phase of infection. The lowest concentration of SARS-CoV-2 viral copies this assay can detect is 250 copies / mL. A negative result does not preclude SARS-CoV-2 infection and should not be used as the sole basis for treatment or other patient management decisions.  A negative result may occur with improper  specimen collection / handling, submission of specimen other than nasopharyngeal swab, presence of viral mutation(s) within the areas targeted by this assay, and inadequate number of viral copies (<250 copies / mL). A negative result must be combined with clinical observations, patient history, and epidemiological information.  Fact Sheet for Patients:   RoadLapTop.co.za  Fact Sheet for Healthcare Providers: http://kim-miller.com/  This test is not yet approved or  cleared by the [place] FDA and has been authorized for detection and/or diagnosis of  SARS-CoV-2 by FDA under an Emergency Use Authorization (EUA).  This EUA will remain in effect (meaning this test can be used) for the duration of the COVID-19 declaration under Section 564(b)(1) of the Act, 21 U.S.C. section 360bbb-3(b)(1), unless the authorization is terminated or revoked sooner.  Performed at [facility], [address] [place], [place] [address]     Blood Alcohol level:  Lab Results  Component Value Date   [facility] <10 02/09/2022   ETH <10 10/13/2021    Physical Findings:  CIWA:    COWS:     Musculoskeletal: Strength & Muscle Tone: within normal limits Gait & Station: normal Patient leans: Front  Psychiatric Specialty Exam:  Presentation  General Appearance:  Casual; Disheveled; Other (comment) (Emaciated, ill looking)  Eye Contact: None  Speech: Other (comment) (incoherent, jibbrish)  Speech Volume: Normal  Handedness: Right   Mood and Affect  Mood: -- (unable to respond to this question, talking nonsensical)  Affect: Constricted   Thought Process  Thought Processes: Disorganized; Irrevelant  Descriptions of Associations:Circumstantial  Orientation:None  Thought Content:Illogical  History of Schizophrenia/Schizoaffective disorder:Yes  Duration of Psychotic Symptoms:Greater than six months  Hallucinations:Hallucinations: Auditory Description of Auditory Hallucinations: Talking to self, continuously talking and moving around in his room, responding to something  Ideas of Reference:-- (unable to assess)  Suicidal Thoughts:Suicidal Thoughts: -- (unable to assess, not interacting pacing)  Homicidal Thoughts:No data recorded  Sensorium  Memory: Other (comment) (unable to assess, pacing around and talking to self and to unseen people.)  Judgment: -- (unable to assess, pacing around and talking to self and to unseen people.)  Insight: -- (unable to assess, pacing around and talking to self and to unseen  people.)   Executive Functions  Concentration: Poor  Attention Span: Poor  Recall: Poor  Fund of Knowledge: Poor  Language: Poor   Psychomotor Activity  Psychomotor Activity: Psychomotor Activity: Restlessness; Increased   Assets  Assets: Housing   Sleep  Sleep: Sleep: -- (unable to assess, pacing around and talking to self and to unseen people.)    Physical Exam: Physical Exam ROS Blood pressure 120/84, pulse 78, temperature 97.8 F (36.6 C), temperature source Oral, resp. rate 18, height 5\' 11"  (1.803 m), weight 63.5 kg, SpO2 99 %. Body mass index is 19.53 kg/m.   Medical Decision Making: Patient continues to requite inpatient Psychiatry care.  He has been accepted at [facility] for tomorrow's transfer.  We will continue to offer Medications and address his needs.  Low dose Haldol 2 mg in the morning and Nighttime remains 5 mg.  Disposition: Transfer to [facility] in [place] in am. Earney Navy, Solis 08/12/2022, 11:47 AM

## 2022-08-12 NOTE — Progress Notes (Signed)
LCSW Progress Note  [id number]   [name]  08/12/2022  9:58 AM  Description:   Inpatient Psychiatric Referral  Patient was recommended inpatient per Dahlia Byes, NP. There are no available beds at [facility], per [facility] Rona Ravens, RN. Patient was referred to the following out of network facilities:   Destination  Service Provider Address Phone Fax  [facility]  [address] [place] [place] [address] [phone]  [facility]  [address] [place] [address] [phone]  [facility]  [address], [place] [address] [phone]  [facility]  [address] [place] [address] [phone]  [facility]  [address] [place] [address] [phone]  [facility]  [address] [place], [place] [address] [phone]  [facility]  [address] [place], [place] [address] [phone]  [facility]  [address] [place] [address] [phone]  [facility]  [address] [place] [address] [phone]  [facility]  [address], [place] [address] [phone]  [facility]  [address] [place] [address] [phone]  [facility]  [address], [place] [address] [phone]  [facility]  [address] [place] [address] [phone]  [facility]  [address] [place], [place] [address] [phone]  [facility] [address] [place]  [address] [place] [address] [phone]  [facility]  [place] [address] [phone]  [facility]  [address] [place], [place] [address] [phone]  [facility]  [address] [place] [address] [phone]  [facility]  [address] [place] [address] [phone]  [facility]  [address] [place] [address] [phone]  [facility]  [address] [place] [address] [phone]  [facility]  [address] [place], [place] [address] [phone]  [facility]  [address], [place] [address] [phone]  [facility]  [address], [place] [address] [phone]  [facility]  [address] [place] [address] [phone]    Situation ongoing, CSW to continue following and update chart as more information becomes available.      Cathie Beams, [place]  08/12/2022 9:58 AM

## 2022-08-12 NOTE — ED Notes (Addendum)
Patient voice continues to escalate as he disrupts the milieu.

## 2022-08-12 NOTE — ED Notes (Signed)
Constant redirection needed to deescalate patient when he raises his voice.

## 2022-08-12 NOTE — ED Notes (Addendum)
Patient refusing vitals being taking this morning. RN notified.

## 2022-08-12 NOTE — Progress Notes (Signed)
[facility] LCSW Progress Note   08/12/2022    7:25 PM  ARMOUR SILVER   [id number]   Type of Contact and Topic:  Psychiatric Bed Placement   Pt accepted to [facility]    Patient meets inpatient criteria per Fayette Pho, NP  The attending provider will be Dr. Loni Beckwith  Call report to [phone]  Pieter Partridge, Paramedic @ [facility] notified.     Pt scheduled  to arrive at [facility] AFTER 0900.   Damita Dunnings, MSW, LCSW-A  7:26 PM 08/12/2022

## 2022-08-12 NOTE — ED Provider Notes (Signed)
Emergency Medicine Observation Re-evaluation Note  [name] is a 42 y.o. male, seen on rounds today.  Pt initially presented to the ED for complaints of Altered Mental Status Currently, the patient is awaiting inpatient psych.  Physical Exam  BP 129/89 (BP Location: Right Arm)   Pulse 67   Temp 98.1 F (36.7 C) (Oral)   Resp 19   Ht 5\' 11"  (1.803 m)   Wt 63.5 kg   SpO2 99%   BMI 19.53 kg/m  Physical Exam General: Calm Cardiac: Well perfused Lungs: even respirations Psych: Calm  ED Course / MDM  EKG:EKG Interpretation Date/Time:  Saturday August 10 2022 19:37:27 EDT Ventricular Rate:  62 PR Interval:  140 QRS Duration:  102 QT Interval:  420 QTC Calculation: 426 R Axis:   63  Text Interpretation: Normal sinus rhythm Normal ECG When compared with ECG of 13-Oct-2021 13:46, PREVIOUS ECG IS PRESENT Confirmed by Glyn Ade [phone]) on 08/10/2022 8:52:47 PM  I have reviewed the labs performed to date as well as medications administered while in observation.  Recent changes in the last 24 hours include intermittent agitation overnight which was able to be re-directed without medication or manual hold.   Plan  Current plan is for inpatient psych.    Maia Plan, MD 08/13/22 [phone]

## 2022-08-12 NOTE — Progress Notes (Addendum)
Pt was accepted to [facility] 08/13/2022, pending improved potassium level. Bed assignment: Main campus  Pt meets inpatient criteria per Dahlia Byes, NP  Attending Physician will be Loni Beckwith, MD  Report can be called to: [phone] (this is a pager, please leave call-back number when giving report)  Pt can arrive after 8 AM  Care Team Notified: Dahlia Byes, NP and Lum Babe, RN  [place], [place]  08/12/2022 10:32 AM

## 2022-08-13 NOTE — ED Notes (Addendum)
Called [place] pager [phone].  Left number for someone to call back to give report.    Left VM for sheriff to transport.

## 2022-08-13 NOTE — ED Notes (Addendum)
[facility] called back and report given. Pt accepted to same. SD will be called at this time as well. SD stated someone would call in about an hour to see if anyone else was accepted to [facility].

## 2022-09-03 ENCOUNTER — Other Ambulatory Visit: Payer: Self-pay

## 2022-09-03 ENCOUNTER — Emergency Department (HOSPITAL_COMMUNITY)
Admission: EM | Admit: 2022-09-03 | Discharge: 2022-09-05 | Disposition: A | Discharge status: Other Acute Inpatient Hospital | Payer: MEDICAID | Attending: Emergency Medicine | Admitting: Emergency Medicine

## 2022-09-03 DIAGNOSIS — F200 Paranoid schizophrenia: Secondary | ICD-10-CM | POA: Diagnosis present

## 2022-09-03 DIAGNOSIS — F17210 Nicotine dependence, cigarettes, uncomplicated: Secondary | ICD-10-CM | POA: Insufficient documentation

## 2022-09-03 DIAGNOSIS — F29 Unspecified psychosis not due to a substance or known physiological condition: Secondary | ICD-10-CM | POA: Insufficient documentation

## 2022-09-03 DIAGNOSIS — R443 Hallucinations, unspecified: Secondary | ICD-10-CM | POA: Diagnosis present

## 2022-09-03 LAB — COMPREHENSIVE METABOLIC PANEL
ALT: 11 U/L (ref 0–44)
AST: 17 U/L (ref 15–41)
Albumin: 3.8 g/dL (ref 3.5–5.0)
Alkaline Phosphatase: 69 U/L (ref 38–126)
Anion gap: 11 (ref 5–15)
BUN: 9 mg/dL (ref 6–20)
CO2: 23 mmol/L (ref 22–32)
Calcium: 8.8 mg/dL — ABNORMAL LOW (ref 8.9–10.3)
Chloride: 103 mmol/L (ref 98–111)
Creatinine, Ser: 0.89 mg/dL (ref 0.61–1.24)
GFR, Estimated: >60 mL/min (ref >60)
Glucose, Bld: 125 mg/dL — ABNORMAL HIGH (ref 70–99)
Potassium: 3.4 mmol/L — ABNORMAL LOW (ref 3.5–5.1)
Sodium: 137 mmol/L (ref 135–145)
Total Bilirubin: 0.6 mg/dL (ref 0.3–1.2)
Total Protein: 7.9 g/dL (ref 6.5–8.1)

## 2022-09-03 LAB — CBC WITH DIFFERENTIAL/PLATELET
Abs Immature Granulocytes: 0.03 10*3/uL (ref 0.00–0.07)
Basophils Absolute: 0.1 10*3/uL (ref 0.0–0.1)
Basophils Relative: 1 %
Eosinophils Absolute: 0 10*3/uL (ref 0.0–0.5)
Eosinophils Relative: 0 %
HCT: 36 % — ABNORMAL LOW (ref 39.0–52.0)
Hemoglobin: 12.1 g/dL — ABNORMAL LOW (ref 13.0–17.0)
Immature Granulocytes: 0 %
Lymphocytes Relative: 21 %
Lymphs Abs: 1.9 10*3/uL (ref 0.7–4.0)
MCH: 25.2 pg — ABNORMAL LOW (ref 26.0–34.0)
MCHC: 33.6 g/dL (ref 30.0–36.0)
MCV: 74.8 fL — ABNORMAL LOW (ref 80.0–100.0)
Monocytes Absolute: 0.9 10*3/uL (ref 0.1–1.0)
Monocytes Relative: 10 %
Neutro Abs: 6.3 10*3/uL (ref 1.7–7.7)
Neutrophils Relative %: 68 %
Platelets: 407 10*3/uL — ABNORMAL HIGH (ref 150–400)
RBC: 4.81 MIL/uL (ref 4.22–5.81)
RDW: 16 % — ABNORMAL HIGH (ref 11.5–15.5)
WBC: 9.3 10*3/uL (ref 4.0–10.5)
nRBC: 0 % (ref 0.0–0.2)

## 2022-09-03 LAB — RAPID URINE DRUG SCREEN, HOSP PERFORMED
Amphetamines: NOT DETECTED
Barbiturates: NOT DETECTED
Benzodiazepines: NOT DETECTED
Cocaine: NOT DETECTED
Opiates: NOT DETECTED
Tetrahydrocannabinol: NOT DETECTED

## 2022-09-03 LAB — SALICYLATE LEVEL: Salicylate Lvl: <7 mg/dL — ABNORMAL LOW (ref 7.0–30.0)

## 2022-09-03 LAB — ACETAMINOPHEN LEVEL: Acetaminophen (Tylenol), Serum: <10 ug/mL — ABNORMAL LOW (ref 10–30)

## 2022-09-03 LAB — ETHANOL: Alcohol, Ethyl (B): <10 mg/dL (ref <10)

## 2022-09-03 MED ORDER — ZIPRASIDONE MESYLATE 20 MG IM SOLR
20.0000 mg | Freq: Once | INTRAMUSCULAR | Status: AC
  Administered 2022-09-03: 20 mg via INTRAMUSCULAR
  Filled 2022-09-03 (×2): qty 20

## 2022-09-03 MED ORDER — ZIPRASIDONE MESYLATE 20 MG IM SOLR
20.0000 mg | Freq: Once | INTRAMUSCULAR | Status: AC
  Administered 2022-09-03: 20 mg via INTRAMUSCULAR
  Filled 2022-09-03: qty 20

## 2022-09-03 MED ORDER — STERILE WATER FOR INJECTION IJ SOLN
INTRAMUSCULAR | Status: AC
  Filled 2022-09-03: qty 10

## 2022-09-03 MED ORDER — STERILE WATER FOR INJECTION IJ SOLN
INTRAMUSCULAR | Status: AC
  Administered 2022-09-03: 1.2 mL
  Filled 2022-09-03: qty 10

## 2022-09-03 MED ORDER — STERILE WATER FOR INJECTION IJ SOLN
INTRAMUSCULAR | Status: AC
  Administered 2022-09-03: 3 mL via INTRAMUSCULAR
  Filled 2022-09-03: qty 10

## 2022-09-03 NOTE — ED Triage Notes (Addendum)
Pt bib father who states pt has been off his medications for 3 weeks. Hx Schizophrenia Pt father states pt has been talking to himself and hallucinating. Pt denies HI/SI

## 2022-09-03 NOTE — ED Notes (Incomplete)
Assisted patient to the restroom along with security and Patient walked out and

## 2022-09-03 NOTE — ED Notes (Signed)
Attempted EKG and was unable to obtain due to obsessive movement but as soon as he is calmer or the medicine starts working I will obtain it.

## 2022-09-03 NOTE — ED Notes (Signed)
Pt refused blood draw.   Informed RN.

## 2022-09-03 NOTE — ED Notes (Signed)
Patient is really amped up he is walking around in his underwear. Patient is yelling but is not making any sense. Provider notified.

## 2022-09-03 NOTE — ED Provider Notes (Signed)
[facility] [place] EMERGENCY DEPARTMENT AT [facility] Provider Note   CSN: [id number] Arrival date & time: 09/03/22  1222     History  Chief Complaint  Patient presents with   Psychiatric Evaluation    [name] is a 42 y.o. male with a past medical history significant for schizophrenia and tardive dyskinesia who presents to the ED due hallucinations. Difficult to obtain HPI due to tangential speech. Father at bedside provided history. He notes patient was recently started on haloperidol and banophen in addition to his trazodone and has not taken his medications in over 1 month. Father notes patient started acting bizarre a week or 2 ago. Denies alcohol and drug use. Admits to cigar and cigarette use. Denies SI and HI. Appears to be responding to internal stimuli during initial evaluation. Patient lives with father.   Level 5 caveat secondary to psychiatric illness     Home Medications Prior to Admission medications   Medication Sig Start Date End Date Taking? Authorizing Provider  hydrOXYzine (ATARAX) 25 MG tablet Take 1 tablet (25 mg total) by mouth 3 (three) times daily as needed for anxiety. Patient not taking: Reported on 10/13/2021 08/15/21   Sarita Bottom, MD  mirtazapine (REMERON) 7.5 MG tablet Take 1 tablet (7.5 mg total) by mouth at bedtime. Patient not taking: Reported on 10/13/2021 08/15/21   Sarita Bottom, MD  OLANZapine (ZYPREXA) 15 MG tablet Take 15 mg by mouth at bedtime. Patient not taking: Reported on 08/11/2022    [provider]  OLANZapine zydis (ZYPREXA) 10 MG disintegrating tablet Take 1 tablet (10 mg total) by mouth at bedtime. Patient not taking: Reported on 10/13/2021 08/31/21   Jackelyn Poling, NP  temazepam (RESTORIL) 15 MG capsule Take 1 capsule (15 mg total) by mouth at bedtime. Patient not taking: Reported on 10/13/2021 08/15/21   Sarita Bottom, MD  traZODone (DESYREL) 100 MG tablet Take 100 mg by mouth at bedtime. Patient not taking: Reported on  08/11/2022    [provider]  valbenazine [facility]) 40 MG capsule Take 1 capsule (40 mg total) by mouth daily. Patient not taking: Reported on 10/13/2021 08/15/21   Sarita Bottom, MD      Allergies    Shellfish-derived products, Hydroxyzine, Mirtazapine, Olanzapine, Risperdal [risperidone], and Temazepam    Review of Systems   Review of Systems  Unable to perform ROS: Psychiatric disorder  Psychiatric/Behavioral:  Positive for hallucinations.     Physical Exam Updated Vital Signs BP 98/71   Pulse 63   Temp 98.2 F (36.8 C) (Axillary)   Resp 18   Ht 5\' 11"  (1.803 m)   Wt 63.4 kg   SpO2 95%   BMI 19.49 kg/m  Physical Exam Vitals and nursing note reviewed.  Constitutional:      General: He is not in acute distress.    Appearance: He is not ill-appearing.  HENT:     Head: Normocephalic.  Eyes:     Pupils: Pupils are equal, round, and reactive to light.  Cardiovascular:     Rate and Rhythm: Normal rate and regular rhythm.     Pulses: Normal pulses.     Heart sounds: Normal heart sounds. No murmur heard.    No friction rub. No gallop.  Pulmonary:     Effort: Pulmonary effort is normal.     Breath sounds: Normal breath sounds.  Abdominal:     General: Abdomen is flat. There is no distension.     Palpations: Abdomen is soft.  Tenderness: There is no abdominal tenderness. There is no guarding or rebound.  Musculoskeletal:        General: Normal range of motion.     Cervical back: Neck supple.  Skin:    General: Skin is warm and dry.  Neurological:     General: No focal deficit present.     Mental Status: He is alert.  Psychiatric:        Mood and Affect: Mood normal.        Speech: Speech is tangential.        Behavior: Behavior is agitated.     ED Results / Procedures / Treatments   Labs (all labs ordered are listed, but only abnormal results are displayed) Labs Reviewed  COMPREHENSIVE METABOLIC PANEL - Abnormal; Notable for the following components:       Result Value   Potassium 3.4 (*)    Glucose, Bld 125 (*)    Calcium 8.8 (*)    All other components within normal limits  CBC WITH DIFFERENTIAL/PLATELET - Abnormal; Notable for the following components:   Hemoglobin 12.1 (*)    HCT 36.0 (*)    MCV 74.8 (*)    MCH 25.2 (*)    RDW 16.0 (*)    Platelets 407 (*)    All other components within normal limits  ACETAMINOPHEN LEVEL - Abnormal; Notable for the following components:   Acetaminophen (Tylenol), Serum <10 (*)    All other components within normal limits  SALICYLATE LEVEL - Abnormal; Notable for the following components:   Salicylate Lvl <7.0 (*)    All other components within normal limits  ETHANOL  RAPID URINE DRUG SCREEN, HOSP PERFORMED    EKG EKG Interpretation Date/Time:  Tuesday September 03 2022 14:57:58 EDT Ventricular Rate:  100 PR Interval:  153 QRS Duration:  85 QT Interval:  352 QTC Calculation: 454 R Axis:   89  Text Interpretation: Sinus tachycardia Left ventricular hypertrophy Borderline ST elevation, lateral leads No acute changes No significant change since last tracing Confirmed by Derwood Kaplan [phone]) on 09/03/2022 3:15:30 PM  Radiology No results found.  Procedures Procedures    Medications Ordered in ED Medications  ziprasidone (GEODON) injection 20 mg (20 mg Intramuscular Given 09/03/22 1448)  sterile water (preservative free) injection (3 mLs Intramuscular Given 09/03/22 1454)    ED Course/ Medical Decision Making/ A&P Clinical Course as of 09/03/22 1726  Tue Sep 03, 2022  1643 Called lab to inquire about labs. Labs pending. [CA]    Clinical Course User Index [CA] Mannie Stabile, PA-C                                 Medical Decision Making Amount and/or Complexity of Data Reviewed Independent Historian: parent External Data Reviewed: notes. Labs: ordered. Decision-making details documented in ED Course.  Risk Prescription drug management.    This patient presents to  the ED for concern of hallucinations, this involves an extensive number of treatment options, and is a complaint that carries with it a high risk of complications and morbidity.  The differential diagnosis includes psychosis, medical noncompliance, schizophrenia, etc  42 year old male presents to the ED due to hallucinations and bizarre behavior. Father provided history. Recently placed on haloperidol and benophen 1 month ago which he has not started. Has not taken any of his medications in over 1 month. History of schizophrenia. Unable to obtain history from patient  due to tangential speech. Upon arrival, stable vitals. Patient in no acute distress. Patient refused blood work after a few discussions. Geodon given. Medical clearance labs ordered.   Patient IVC'd during ED stay due to active hallucinations and inability to get lab work or give Geodon.   3:43 PM reassessed patient at bedside.  Patient resting comfortably in bed. Awaiting labs.   CBC reassuring.  No leukocytosis.  Mild anemia with hemoglobin at 12.1.  Ethanol, salicylate, acetaminophen levels within normal limits.  CMP significant for hypokalemia 3.4.  Mild hyperglycemia 125.  No anion gap.  Normal renal function.  UDS pending.  Patient has been medically cleared for TTS evaluation.  The patient has been placed in psychiatric observation due to the need to provide a safe environment for the patient while obtaining psychiatric consultation and evaluation, as well as ongoing medical and medication management to treat the patient's condition.  The patient has been placed under full IVC at this time.  Has PCP       Final Clinical Impression(s) / ED Diagnoses Final diagnoses:  Psychosis, unspecified psychosis type [facility])    Rx / DC Orders ED Discharge Orders     None         Mannie Stabile, PA-C 09/03/22 1726    Derwood Kaplan, MD 09/06/22 0031

## 2022-09-03 NOTE — ED Notes (Signed)
Lab called regarding specimen sent at 1505 not being in process. Lab states they have specimen and are going to run them

## 2022-09-03 NOTE — Consult Note (Signed)
Attempt to assess patient failed as he was given Geodon injection earlier.  He is sleeping at this time. Will try when awake.

## 2022-09-03 NOTE — ED Notes (Signed)
Patient going in and out of other patient rooms. Staff is redirecting patient to his room.

## 2022-09-03 NOTE — ED Notes (Signed)
Pt moved to 35, belongings placed moved to locker 35. Pt father called to update

## 2022-09-04 DIAGNOSIS — F200 Paranoid schizophrenia: Secondary | ICD-10-CM

## 2022-09-04 MED ORDER — TRAZODONE HCL 100 MG PO TABS
100.0000 mg | ORAL_TABLET | Freq: Every day | ORAL | Status: DC
  Administered 2022-09-05: 100 mg via ORAL
  Filled 2022-09-04: qty 1

## 2022-09-04 MED ORDER — VALBENAZINE TOSYLATE 40 MG PO CAPS
40.0000 mg | ORAL_CAPSULE | Freq: Every day | ORAL | Status: DC
  Administered 2022-09-04: 40 mg via ORAL
  Filled 2022-09-04 (×2): qty 1

## 2022-09-04 MED ORDER — OLANZAPINE 10 MG PO TABS
10.0000 mg | ORAL_TABLET | Freq: Every day | ORAL | Status: DC
  Administered 2022-09-05: 10 mg via ORAL
  Filled 2022-09-04: qty 1

## 2022-09-04 MED ORDER — STERILE WATER FOR INJECTION IJ SOLN
INTRAMUSCULAR | Status: AC
  Administered 2022-09-04: 10 mL
  Filled 2022-09-04: qty 10

## 2022-09-04 MED ORDER — ZIPRASIDONE MESYLATE 20 MG IM SOLR
20.0000 mg | Freq: Once | INTRAMUSCULAR | Status: AC
  Administered 2022-09-04: 20 mg via INTRAMUSCULAR
  Filled 2022-09-04: qty 20

## 2022-09-04 NOTE — Progress Notes (Signed)
Pt was accepted to [facility] 09/05/2022. Bed assignment: Main campus  Pt meets inpatient criteria per Phebe Colla, NP  Attending Physician will be Loni Beckwith, MD  Report can be called to: [phone] (this is a pager, please leave call-back number when giving report)  Pt can arrive after 8 AM  Care Team Notified: Phebe Colla, NP and Carleene Overlie, Paramedic  [place], LCSW  09/04/2022 2:42 PM

## 2022-09-04 NOTE — Progress Notes (Signed)
LCSW Progress Note  [id number]   DAELIN MAIRE  09/04/2022  2:17 PM  Description:   Inpatient Psychiatric Referral  Patient was recommended inpatient per Phebe Colla, NP. There are no available beds at [facility], per [facility] Malva Limes, RN. Patient was referred to the following out of network facilities:   [facility] Provider Address Phone Fax  [facility]  [address] [place] [address] [phone]  [facility]  [address] [place] [address] [phone]  [facility]  [address], [place] [address] [phone]  [facility]  [address] [place], [place] [address] [phone]  [facility] [address] [place]  [address], [place] [address] [phone]  [facility]  [address] [place], [place] [address] [phone]  [facility]  [address] [place] [address] [phone]  [facility]  [address] [place] [address] [phone]  [facility]  [address] [place] [address] [phone]  [facility]  [address] [place], [place] [address] [phone]  [facility]  [address] [place] [address] [phone]  [facility]  [address] [place] [address] [phone]  [facility]  [address] [place] [address] [phone]  [facility]  [address] [place] [address] [phone]  [facility]  [address] [place] [address] [phone]  [facility]  [address], [place]  [address] [phone]  [facility]  [address], [place] [address] [phone]  [facility]  [address] [place] [address] [phone]  [facility]  [address] [place] [address] [phone]  [facility]  [address], [place] [address] [phone]  [facility]  [address] [place] [address] [phone]  [facility]  [address], [place] [address] [phone]  [facility]  [address] [place] [address] [phone]  [facility]  [address] [place] [address] [phone]  [facility]  [address] [place] [address] [phone]    Situation ongoing, CSW to continue following and update chart as more information becomes available.      Cathie Beams, [place]  09/04/2022 2:17 PM

## 2022-09-04 NOTE — BH Assessment (Signed)
TTS attempted to assess pt. Per Michaelyn Barter, pt is sleeping due to receiving another Geodon injection at 2145. TTS will be notified when pt wakes up.

## 2022-09-04 NOTE — Consult Note (Signed)
[facility] ED ASSESSMENT   Reason for Consult:  Psych Consult Referring Physician:  Claudette Stapler Patient Identification: MASOUD SCHMIDTKE MRN:  [id number] ED Chief Complaint: Paranoid schizophrenia [facility])  Diagnosis:  Principal Problem:   Paranoid schizophrenia [facility])   ED Assessment Time Calculation: Start Time: 0800 Stop Time: 0900 Total Time in Minutes (Assessment Completion): 60   HPI:  BETO FOUGEROUSSE is a 42 y.o. male patient brought in by his father for increasing symptoms of schizophrenia in the setting of medication non-compliance for 3 weeks.     Subjective:   YOUNESS LAWES is a 42 y.o. male patient brought in by his father for increasing symptoms of schizophrenia in the setting of medication non-compliance for 3 weeks.  Patient has a history of schizophrenia, coronary artery disease, a ventricular shunt and tardive dyskinesia.    Patient is interviewed face to face in the hallway at [facility] long emergency department.  Patient does not make eye contact.  He is oriented to self.  He denies suicidal ideation and homicidal ideation.  He will not respond when asked about auditory or visual hallucinations.  He instead begins speaking non-sensically to unseen others while turning away from provider.   Collateral Marjorie Behanna [phone] - Mr Bolivar says he gives the patient his medications each night and patient just stopped taking his medications.  Patient told his father, "the medicine causes cancer and I won't take it."  Father reports patient was stable for a long time and then after a medication change, patient began to have difficulty.  Father also reports he has found a liquor bottle under patients mattress on 2 separate occasions.  Per father, patient's baseline is much different than his current presentation.       It is charted under allergies that patient has an intolerance to zyprexa; it makes him lethargic and may contribute to his TD.  Upon chart review, patient was recently (jan  2024) stabilized and discharged on zyprexa.  In addition, he agreed to take zyprexa earlier this month in the setting of the noted intolerance.  Patient's father was unaware of an allergy to zyprexa.  Plan is to recommend inpatient psychiatric hospitalization for stabilization.  Provider will restart his psychiatric medication.     Past Psychiatric History: Patient has a dx of schizophrenia, and tardive dyskinesia.  He has had multiple emergency room and inpatient hospital stays.    Risk to Self or Others: Is the patient at risk to self? No Has the patient been a risk to self in the past 6 months? No Has the patient been a risk to self within the distant past? No Is the patient a risk to others? No Has the patient been a risk to others in the past 6 months? No Has the patient been a risk to others within the distant past? No  [place] Scale:  Flowsheet Row ED from 09/03/2022 in [facility] Emergency Department at [facility] ED from 08/10/2022 in [facility] Emergency Department at [facility] ED from 07/13/2022 in [facility] Emergency Department at [facility]  C-SSRS RISK CATEGORY No Risk No Risk No Risk       AIMS: Facial and Oral Movements Muscles of Facial Expression: Mild Lips and Perioral Area: None, normal Jaw: None, normal Tongue: None, normal,Extremity Movements Upper (arms, wrists, hands, fingers): Mild Lower (legs, knees, ankles, toes): None, normal,Trunk Movements Neck, shoulders, hips: Mild, Overall Severity Severity of abnormal movements (highest score from questions above): Mild Incapacitation  due to abnormal movements: None, normal Patient's awareness of abnormal movements (rate only patient's report): No Awareness (Patient did not answer),Dental Status Current problems with teeth and/or dentures?: No Does patient usually wear dentures?: No  ASAM:    Substance Abuse:     Past Medical History:  Past Medical History:  Diagnosis Date    Schizophrenia (HCC)     Past Surgical History:  Procedure Laterality Date   shunt surgery     Family History:  Family History  Problem Relation Age of Onset   Stroke Mother    Thyroid disease Father    Family Psychiatric  History: None noted Social History:  Social History   Substance and Sexual Activity  Alcohol Use Not Currently     Social History   Substance and Sexual Activity  Drug Use Not Currently   Types: Marijuana    Social History   Socioeconomic History   Marital status: Single    Spouse name: Not on file   Number of children: Not on file   Years of education: Not on file   Highest education level: Not on file  Occupational History   Not on file  Tobacco Use   Smoking status: Every Day    Types: Cigars   Smokeless tobacco: Never  Vaping Use   Vaping status: Former  Substance and Sexual Activity   Alcohol use: Not Currently   Drug use: Not Currently    Types: Marijuana   Sexual activity: Not on file  Other Topics Concern   Not on file  Social History Narrative   Not on file   Social Determinants of Health   Financial Resource Strain: Patient Declined (02/10/2022)   Received from [facility], [facility]   Overall Financial Resource Strain (CARDIA)    Difficulty of Paying Living Expenses: Patient declined  Food Insecurity: Food Insecurity Present (02/11/2022)   Received from [facility], [facility]   Hunger Vital Sign    Worried About Running Out of Food in the Last Year: Sometimes true    Ran Out of Food in the Last Year: Sometimes true  Transportation Needs: Patient Declined (02/10/2022)   Received from [facility], [facility]   [facility] - Transportation    Lack of Transportation (Medical): Patient declined    Lack of Transportation (Non-Medical): Patient declined  Physical Activity: Inactive (02/10/2022)   Received from [facility], [facility]   Exercise Vital Sign    Days of Exercise per Week: 0 days     Minutes of Exercise per Session: 0 min  Stress: Patient Declined (02/10/2022)   Received from [facility], [facility] of Occupational Health - Occupational Stress Questionnaire    Feeling of Stress : Patient declined  Social Connections: Patient Declined (02/10/2022)   Received from [facility], [facility]   Social Connection and Isolation Panel [NHANES]    Frequency of Communication with Friends and Family: Patient declined    Frequency of Social Gatherings with Friends and Family: Patient declined    Attends Religious Services: Patient declined    Database administrator or Organizations: Patient declined    Attends Banker Meetings: Patient declined    Marital Status: Patient declined   Additional Social History: Patient lives with his father.    Allergies:   Allergies  Allergen Reactions   Shellfish-Derived Products Anaphylaxis   Hydroxyzine Other (See Comments)  Made the patient "too lethargic" (also may have caused Tardive dyskinesia)   Mirtazapine Other (See Comments)    Made the patient "too lethargic" (also may have caused Tardive dyskinesia)   Olanzapine Other (See Comments)    Made the patient "too lethargic" (also may have caused Tardive dyskinesia)   Risperdal [Risperidone] Other (See Comments)    Tardive dyskinesia   Temazepam Other (See Comments)    Made the patient "too lethargic" (also may have caused Tardive dyskinesia)    Labs:  Results for orders placed or performed during the hospital encounter of 09/03/22 (from the past 48 hour(s))  Comprehensive metabolic panel     Status: Abnormal   Collection Time: 09/03/22  3:05 PM  Result Value Ref Range   Sodium 137 135 - 145 mmol/L   Potassium 3.4 (L) 3.5 - 5.1 mmol/L   Chloride 103 98 - 111 mmol/L   CO2 23 22 - 32 mmol/L   Glucose, Bld 125 (H) 70 - 99 mg/dL    Comment: Glucose reference range applies only to samples taken after fasting for at least 8 hours.    BUN 9 6 - 20 mg/dL   Creatinine, Ser 5.57 0.61 - 1.24 mg/dL   Calcium 8.8 (L) 8.9 - 10.3 mg/dL   Total Protein 7.9 6.5 - 8.1 g/dL   Albumin 3.8 3.5 - 5.0 g/dL   AST 17 15 - 41 U/L   ALT 11 0 - 44 U/L   Alkaline Phosphatase 69 38 - 126 U/L   Total Bilirubin 0.6 0.3 - 1.2 mg/dL   GFR, Estimated >32 >20 mL/min    Comment: (NOTE) Calculated using the CKD-EPI Creatinine Equation (2021)    Anion gap 11 5 - 15    Comment: Performed at [facility], [address], [place], [place] [address]  Ethanol     Status: None   Collection Time: 09/03/22  3:05 PM  Result Value Ref Range   Alcohol, Ethyl (B) <10 <10 mg/dL    Comment: (NOTE) Lowest detectable limit for serum alcohol is 10 mg/dL.  For medical purposes only. Performed at [facility], [address], [place], [place] [address]   CBC with Diff     Status: Abnormal   Collection Time: 09/03/22  3:05 PM  Result Value Ref Range   WBC 9.3 4.0 - 10.5 K/uL   RBC 4.81 4.22 - 5.81 MIL/uL   Hemoglobin 12.1 (L) 13.0 - 17.0 g/dL   HCT 62.8 (L) 31.5 - 17.6 %   MCV 74.8 (L) 80.0 - 100.0 fL   MCH 25.2 (L) 26.0 - 34.0 pg   MCHC 33.6 30.0 - 36.0 g/dL   RDW 16.0 (H) 73.7 - 10.6 %   Platelets 407 (H) 150 - 400 K/uL   nRBC 0.0 0.0 - 0.2 %   Neutrophils Relative % 68 %   Neutro Abs 6.3 1.7 - 7.7 K/uL   Lymphocytes Relative 21 %   Lymphs Abs 1.9 0.7 - 4.0 K/uL   Monocytes Relative 10 %   Monocytes Absolute 0.9 0.1 - 1.0 K/uL   Eosinophils Relative 0 %   Eosinophils Absolute 0.0 0.0 - 0.5 K/uL   Basophils Relative 1 %   Basophils Absolute 0.1 0.0 - 0.1 K/uL   Immature Granulocytes 0 %   Abs Immature Granulocytes 0.03 0.00 - 0.07 K/uL    Comment: Performed at [facility], [address], [place], [place] [address]  Acetaminophen level  Status: Abnormal   Collection Time: 09/03/22  3:05 PM  Result Value Ref Range   Acetaminophen (Tylenol), Serum <10 (L) 10 - 30 ug/mL    Comment:  (NOTE) Therapeutic concentrations vary significantly. A range of 10-30 ug/mL  may be an effective concentration for many patients. However, some  are best treated at concentrations outside of this range. Acetaminophen concentrations >150 ug/mL at 4 hours after ingestion  and >50 ug/mL at 12 hours after ingestion are often associated with  toxic reactions.  Performed at [facility], [address] [place], [place] [address]   Salicylate level     Status: Abnormal   Collection Time: 09/03/22  3:05 PM  Result Value Ref Range   Salicylate Lvl <7.0 (L) 7.0 - 30.0 mg/dL    Comment: Performed at [facility], [address] [place], [place] [address]  Urine rapid drug screen (hosp performed)     Status: None   Collection Time: 09/03/22  6:37 PM  Result Value Ref Range   Opiates NONE DETECTED NONE DETECTED   Cocaine NONE DETECTED NONE DETECTED   Benzodiazepines NONE DETECTED NONE DETECTED   Amphetamines NONE DETECTED NONE DETECTED   Tetrahydrocannabinol NONE DETECTED NONE DETECTED   Barbiturates NONE DETECTED NONE DETECTED    Comment: (NOTE) DRUG SCREEN FOR MEDICAL PURPOSES ONLY.  IF CONFIRMATION IS NEEDED FOR ANY PURPOSE, NOTIFY LAB WITHIN 5 DAYS.  LOWEST DETECTABLE LIMITS FOR URINE DRUG SCREEN Drug Class                     Cutoff (ng/mL) Amphetamine and metabolites    1000 Barbiturate and metabolites    200 Benzodiazepine                 200 Opiates and metabolites        300 Cocaine and metabolites        300 THC                            50 Performed at [facility], [address], [place], [place] [address]     Current Facility-Administered Medications  Medication Dose Route Frequency Provider Last Rate Last Admin   OLANZapine (ZYPREXA) tablet 10 mg  10 mg Oral QHS Raygen Linquist A, NP       traZODone (DESYREL) tablet 100 mg  100 mg Oral QHS Jaid Quirion A, NP       Current Outpatient Medications  Medication  Sig Dispense Refill   hydrOXYzine (ATARAX) 25 MG tablet Take 1 tablet (25 mg total) by mouth 3 (three) times daily as needed for anxiety. (Patient not taking: Reported on 10/13/2021) 30 tablet 0   mirtazapine (REMERON) 7.5 MG tablet Take 1 tablet (7.5 mg total) by mouth at bedtime. (Patient not taking: Reported on 10/13/2021) 30 tablet 0   OLANZapine (ZYPREXA) 15 MG tablet Take 15 mg by mouth at bedtime. (Patient not taking: Reported on 08/11/2022)     OLANZapine zydis (ZYPREXA) 10 MG disintegrating tablet Take 1 tablet (10 mg total) by mouth at bedtime. (Patient not taking: Reported on 10/13/2021) 30 tablet 1   temazepam (RESTORIL) 15 MG capsule Take 1 capsule (15 mg total) by mouth at bedtime. (Patient not taking: Reported on 10/13/2021) 30 capsule 0   traZODone (DESYREL) 100 MG tablet Take 100 mg by mouth at bedtime. (Patient not taking: Reported on 08/11/2022)     valbenazine (INGREZZA) 40  MG capsule Take 1 capsule (40 mg total) by mouth daily. (Patient not taking: Reported on 10/13/2021) 30 capsule 0    Musculoskeletal: Strength & Muscle Tone: within normal limits Gait & Station: normal Patient leans: N/A   Psychiatric Specialty Exam: Presentation  General Appearance:  Bizarre; Disheveled  Eye Contact: Minimal  Speech: Pressured  Speech Volume: Normal  Handedness: Right   Mood and Affect  Mood: Labile  Affect: Labile   Thought Process  Thought Processes: Disorganized  Descriptions of Associations:Loose  Orientation:Partial (Oriented to self)  Thought Content:Illogical  History of Schizophrenia/Schizoaffective disorder:Yes  Duration of Psychotic Symptoms:Greater than six months  Hallucinations:Hallucinations: Auditory; Visual Description of Auditory Hallucinations: Patient is talking to unseen others but will not report what he is hearing Description of Visual Hallucinations: Patient is looking at unseen others but will not report what he is seeing  Ideas of  Reference:Other (comment) (Patient is acting paranoid and trying to elope; he will not discuss.)  Suicidal Thoughts:Suicidal Thoughts: No  Homicidal Thoughts:Homicidal Thoughts: No   Sensorium  Memory: Immediate Poor; Recent Poor; Remote Poor  Judgment: Impaired  Insight: Lacking   Executive Functions  Concentration: Poor  Attention Span: Poor  Recall: Poor  Fund of Knowledge: Poor  Language: Poor   Psychomotor Activity  Psychomotor Activity: Psychomotor Activity: Extrapyramidal Side Effects (EPS) Extrapyramidal Side Effects (EPS): Akathisia AIMS Completed?: Yes   Assets  Assets: Social Support; Housing; Leisure Time    Sleep  Sleep: Sleep: Fair   Physical Exam: Physical Exam Vitals and nursing note reviewed.  Eyes:     Pupils: Pupils are equal, round, and reactive to light.  Pulmonary:     Effort: Pulmonary effort is normal.  Neurological:     Mental Status: He is alert. He is disoriented.    Review of Systems  Psychiatric/Behavioral:  Positive for hallucinations. The patient is nervous/anxious.        Attempting elopement  All other systems reviewed and are negative.  Blood pressure 123/88, pulse 88, temperature 97.9 F (36.6 C), temperature source Oral, resp. rate 20, height 5\' 11"  (1.803 m), weight 63.4 kg, SpO2 100%. Body mass index is 19.49 kg/m.  Medical Decision Making: Patient case reviewed and discussed with Dr Lucianne Muss.  Patient is actively psychotic and meets criteria for inpatient hospitalization.    Problem 1: Schizophrenia -Zyprexa 10mg  PO Q HS -Trazodone 100mg  PO Q HS -Ingrezza 40mg  PO Q day   Disposition:  Recommend inpatient psychiatric hospitalization for crisis stabilization and medication management.  Thomes Lolling, NP 09/04/2022 9:55 AM

## 2022-09-04 NOTE — ED Notes (Signed)
Pt very agitated, angry, yelling, and banging on glass of nurse station with fist while attempting to open door to station.

## 2022-09-04 NOTE — ED Provider Notes (Signed)
Emergency Medicine Observation Re-evaluation Note  [name] is a 42 y.o. male, seen on rounds today.  Pt initially presented to the ED for complaints of Psychiatric Evaluation Currently, the patient is sleeping.  Physical Exam  BP 123/88 (BP Location: Right Arm)   Pulse 88   Temp 97.9 F (36.6 C) (Oral)   Resp 20   Ht 5\' 11"  (1.803 m)   Wt 63.4 kg   SpO2 100%   BMI 19.49 kg/m  Physical Exam General: nad Cardiac: regular Lungs: clear Psych: calm and cooperative  ED Course / MDM  EKG:EKG Interpretation Date/Time:  Tuesday September 03 2022 14:57:58 EDT Ventricular Rate:  100 PR Interval:  153 QRS Duration:  85 QT Interval:  352 QTC Calculation: 454 R Axis:   89  Text Interpretation: Sinus tachycardia Left ventricular hypertrophy Borderline ST elevation, lateral leads No acute changes No significant change since last tracing Confirmed by Derwood Kaplan [phone]) on 09/03/2022 3:15:30 PM  I have reviewed the labs performed to date as well as medications administered while in observation.  Recent changes in the last 24 hours include none.  Plan  Current plan is for waiting inpt psych placement.    Gwyneth Sprout, MD 09/04/22 [phone]

## 2022-09-04 NOTE — ED Notes (Signed)
Pt refused vitals 

## 2022-09-05 NOTE — ED Notes (Signed)
Report given to Caren Macadam, RN at [facility].

## 2022-09-05 NOTE — ED Provider Notes (Signed)
Emergency Medicine Observation Re-evaluation Note  [name] is a 42 y.o. male, seen on rounds today.  Pt initially presented to the ED for complaints of Psychiatric Evaluation Currently, the patient is awaiting transport to [facility].  Sheriff will be coming to transport him later today.  Physical Exam  BP 118/82 (BP Location: Right Arm)   Pulse 69   Temp 98.7 F (37.1 C) (Oral)   Resp 18   Ht 1.803 m (5\' 11" )   Wt 63.4 kg   SpO2 98%   BMI 19.49 kg/m  Physical Exam General: Nontoxic no acute distress Cardiac:  Lungs: No respiratory distress Psych: Baseline cooperative  ED Course / MDM  EKG:EKG Interpretation Date/Time:  Tuesday September 03 2022 14:57:58 EDT Ventricular Rate:  100 PR Interval:  153 QRS Duration:  85 QT Interval:  352 QTC Calculation: 454 R Axis:   89  Text Interpretation: Sinus tachycardia Left ventricular hypertrophy Borderline ST elevation, lateral leads No acute changes No significant change since last tracing Confirmed by Derwood Kaplan [phone]) on 09/03/2022 3:15:30 PM  I have reviewed the labs performed to date as well as medications administered while in observation.  Recent changes in the last 24 hours include did require Geodon.  Patient is awaiting transport to [facility] today.  Plan  Current plan is for waiting transport to [facility] by [place].  Patient is IVC.    Vanetta Mulders, MD 09/05/22 [phone]

## 2022-09-30 ENCOUNTER — Emergency Department (HOSPITAL_COMMUNITY)
Admission: EM | Admit: 2022-09-30 | Discharge: 2022-10-01 | Disposition: A | Discharge status: Other Acute Inpatient Hospital | Payer: MEDICAID | Attending: Emergency Medicine | Admitting: Emergency Medicine

## 2022-09-30 ENCOUNTER — Encounter (HOSPITAL_COMMUNITY): Payer: Self-pay | Admitting: *Deleted

## 2022-09-30 ENCOUNTER — Other Ambulatory Visit: Payer: Self-pay

## 2022-09-30 DIAGNOSIS — R4689 Other symptoms and signs involving appearance and behavior: Secondary | ICD-10-CM | POA: Diagnosis present

## 2022-09-30 DIAGNOSIS — E876 Hypokalemia: Secondary | ICD-10-CM | POA: Diagnosis not present

## 2022-09-30 DIAGNOSIS — F209 Schizophrenia, unspecified: Secondary | ICD-10-CM | POA: Insufficient documentation

## 2022-09-30 MED ORDER — STERILE WATER FOR INJECTION IJ SOLN
INTRAMUSCULAR | Status: AC
  Filled 2022-09-30: qty 10

## 2022-09-30 MED ORDER — ZIPRASIDONE MESYLATE 20 MG IM SOLR
10.0000 mg | Freq: Once | INTRAMUSCULAR | Status: AC
  Administered 2022-10-01: 10 mg via INTRAMUSCULAR
  Filled 2022-09-30: qty 20
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
## 2022-09-30 NOTE — ED Triage Notes (Signed)
Pt arrives with his father, who is unsure if patient has been taking his medications, says the "pt is psychotic today". Pt is agitated, unable to redirect.

## 2022-10-01 ENCOUNTER — Inpatient Hospital Stay (HOSPITAL_COMMUNITY)
Admission: AD | Admit: 2022-10-01 | Discharge: 2022-10-08 | DRG: 885 | Disposition: A | Discharge status: Home / Self Care | Payer: MEDICAID | Source: Intra-hospital | Attending: Psychiatry | Admitting: Psychiatry

## 2022-10-01 ENCOUNTER — Encounter (HOSPITAL_COMMUNITY): Payer: Self-pay | Admitting: Nurse Practitioner

## 2022-10-01 ENCOUNTER — Encounter (HOSPITAL_COMMUNITY): Payer: Self-pay

## 2022-10-01 DIAGNOSIS — G919 Hydrocephalus, unspecified: Secondary | ICD-10-CM | POA: Diagnosis present

## 2022-10-01 DIAGNOSIS — F209 Schizophrenia, unspecified: Secondary | ICD-10-CM | POA: Diagnosis not present

## 2022-10-01 DIAGNOSIS — Z91013 Allergy to seafood: Secondary | ICD-10-CM

## 2022-10-01 DIAGNOSIS — Z5941 Food insecurity: Secondary | ICD-10-CM | POA: Diagnosis not present

## 2022-10-01 DIAGNOSIS — Z8349 Family history of other endocrine, nutritional and metabolic diseases: Secondary | ICD-10-CM | POA: Diagnosis not present

## 2022-10-01 DIAGNOSIS — Z888 Allergy status to other drugs, medicaments and biological substances status: Secondary | ICD-10-CM | POA: Diagnosis not present

## 2022-10-01 DIAGNOSIS — F17290 Nicotine dependence, other tobacco product, uncomplicated: Secondary | ICD-10-CM | POA: Diagnosis present

## 2022-10-01 DIAGNOSIS — Z79899 Other long term (current) drug therapy: Secondary | ICD-10-CM | POA: Diagnosis not present

## 2022-10-01 DIAGNOSIS — Z982 Presence of cerebrospinal fluid drainage device: Secondary | ICD-10-CM | POA: Diagnosis not present

## 2022-10-01 DIAGNOSIS — Z823 Family history of stroke: Secondary | ICD-10-CM | POA: Diagnosis not present

## 2022-10-01 DIAGNOSIS — F200 Paranoid schizophrenia: Secondary | ICD-10-CM | POA: Diagnosis present

## 2022-10-01 LAB — COMPREHENSIVE METABOLIC PANEL
ALT: 11 U/L (ref 0–44)
AST: 16 U/L (ref 15–41)
Albumin: 4.1 g/dL (ref 3.5–5.0)
Alkaline Phosphatase: 83 U/L (ref 38–126)
Anion gap: 14 (ref 5–15)
BUN: 11 mg/dL (ref 6–20)
CO2: 22 mmol/L (ref 22–32)
Calcium: 9 mg/dL (ref 8.9–10.3)
Chloride: 97 mmol/L — ABNORMAL LOW (ref 98–111)
Creatinine, Ser: 1.15 mg/dL (ref 0.61–1.24)
GFR, Estimated: >60 mL/min (ref >60)
Glucose, Bld: 129 mg/dL — ABNORMAL HIGH (ref 70–99)
Potassium: 2.4 mmol/L — CL (ref 3.5–5.1)
Sodium: 133 mmol/L — ABNORMAL LOW (ref 135–145)
Total Bilirubin: 0.6 mg/dL (ref 0.3–1.2)
Total Protein: 8.4 g/dL — ABNORMAL HIGH (ref 6.5–8.1)

## 2022-10-01 LAB — CBC WITH DIFFERENTIAL/PLATELET
Abs Immature Granulocytes: 0 10*3/uL (ref 0.00–0.07)
Basophils Absolute: 0.1 10*3/uL (ref 0.0–0.1)
Basophils Relative: 1 %
Eosinophils Absolute: 0 10*3/uL (ref 0.0–0.5)
Eosinophils Relative: 0 %
HCT: 37.2 % — ABNORMAL LOW (ref 39.0–52.0)
Hemoglobin: 12.8 g/dL — ABNORMAL LOW (ref 13.0–17.0)
Lymphocytes Relative: 17 %
Lymphs Abs: 2.1 10*3/uL (ref 0.7–4.0)
MCH: 25.3 pg — ABNORMAL LOW (ref 26.0–34.0)
MCHC: 34.4 g/dL (ref 30.0–36.0)
MCV: 73.7 fL — ABNORMAL LOW (ref 80.0–100.0)
Monocytes Absolute: 0.9 10*3/uL (ref 0.1–1.0)
Monocytes Relative: 7 %
Neutro Abs: 9.2 10*3/uL — ABNORMAL HIGH (ref 1.7–7.7)
Neutrophils Relative %: 75 %
Platelets: 479 10*3/uL — ABNORMAL HIGH (ref 150–400)
RBC: 5.05 MIL/uL (ref 4.22–5.81)
RDW: 15.9 % — ABNORMAL HIGH (ref 11.5–15.5)
WBC: 12.2 10*3/uL — ABNORMAL HIGH (ref 4.0–10.5)
nRBC: 0 % (ref 0.0–0.2)

## 2022-10-01 LAB — RAPID URINE DRUG SCREEN, HOSP PERFORMED
Amphetamines: NOT DETECTED
Barbiturates: NOT DETECTED
Benzodiazepines: NOT DETECTED
Cocaine: NOT DETECTED
Opiates: NOT DETECTED
Tetrahydrocannabinol: NOT DETECTED

## 2022-10-01 LAB — POTASSIUM: Potassium: 3.6 mmol/L (ref 3.5–5.1)

## 2022-10-01 LAB — MAGNESIUM: Magnesium: 1.9 mg/dL (ref 1.7–2.4)

## 2022-10-01 LAB — ETHANOL: Alcohol, Ethyl (B): <10 mg/dL (ref <10)

## 2022-10-01 MED ORDER — ZIPRASIDONE MESYLATE 20 MG IM SOLR
10.0000 mg | Freq: Once | INTRAMUSCULAR | Status: AC
  Administered 2022-10-01: 10 mg via INTRAMUSCULAR
  Filled 2022-10-01: qty 20

## 2022-10-01 MED ORDER — AMANTADINE HCL 100 MG PO CAPS
100.0000 mg | ORAL_CAPSULE | Freq: Two times a day (BID) | ORAL | Status: DC
  Administered 2022-10-02 – 2022-10-08 (×12): 100 mg via ORAL
  Filled 2022-10-01 (×17): qty 1

## 2022-10-01 MED ORDER — AMANTADINE HCL 100 MG PO CAPS: 100.0000 mg | ORAL_CAPSULE | Freq: Two times a day (BID) | ORAL | Status: DC

## 2022-10-01 MED ORDER — DIPHENHYDRAMINE HCL 25 MG PO CAPS
25.0000 mg | ORAL_CAPSULE | Freq: Three times a day (TID) | ORAL | Status: DC | PRN
  Filled 2022-10-01: qty 1

## 2022-10-01 MED ORDER — HALOPERIDOL LACTATE 5 MG/ML IJ SOLN
5.0000 mg | Freq: Three times a day (TID) | INTRAMUSCULAR | Status: DC | PRN
  Administered 2022-10-01 – 2022-10-02 (×2): 5 mg via INTRAMUSCULAR
  Filled 2022-10-01 (×2): qty 1

## 2022-10-01 MED ORDER — HALOPERIDOL LACTATE 5 MG/ML IJ SOLN
5.0000 mg | Freq: Once | INTRAMUSCULAR | Status: AC
  Administered 2022-10-01: 5 mg via INTRAMUSCULAR
  Filled 2022-10-01: qty 1

## 2022-10-01 MED ORDER — DIPHENHYDRAMINE HCL 25 MG PO CAPS: 25.0000 mg | ORAL_CAPSULE | Freq: Three times a day (TID) | ORAL | Status: DC | PRN

## 2022-10-01 MED ORDER — DIPHENHYDRAMINE HCL 50 MG/ML IJ SOLN: 25.0000 mg | Freq: Three times a day (TID) | INTRAMUSCULAR | Status: DC | PRN

## 2022-10-01 MED ORDER — POTASSIUM CHLORIDE CRYS ER 20 MEQ PO TBCR: 40.0000 meq | EXTENDED_RELEASE_TABLET | Freq: Once | ORAL | Status: DC

## 2022-10-01 MED ORDER — HALOPERIDOL 5 MG PO TABS: 5.0000 mg | ORAL_TABLET | Freq: Three times a day (TID) | ORAL | Status: DC | PRN

## 2022-10-01 MED ORDER — CARMEX CLASSIC LIP BALM EX OINT
TOPICAL_OINTMENT | Freq: Once | CUTANEOUS | Status: AC
  Filled 2022-10-01: qty 10

## 2022-10-01 MED ORDER — ALUM & MAG HYDROXIDE-SIMETH 200-200-20 MG/5ML PO SUSP: 30.0000 mL | ORAL | Status: DC | PRN

## 2022-10-01 MED ORDER — AMANTADINE HCL 100 MG PO CAPS
100.0000 mg | ORAL_CAPSULE | Freq: Two times a day (BID) | ORAL | Status: DC
  Administered 2022-10-01: 100 mg via ORAL
  Filled 2022-10-01 (×2): qty 1

## 2022-10-01 MED ORDER — ACETAMINOPHEN 325 MG PO TABS: 650.0000 mg | ORAL_TABLET | Freq: Four times a day (QID) | ORAL | Status: DC | PRN

## 2022-10-01 MED ORDER — DIPHENHYDRAMINE HCL 50 MG/ML IJ SOLN
25.0000 mg | Freq: Three times a day (TID) | INTRAMUSCULAR | Status: DC | PRN
  Administered 2022-10-01 – 2022-10-02 (×2): 25 mg via INTRAMUSCULAR
  Filled 2022-10-01 (×2): qty 1

## 2022-10-01 MED ORDER — STERILE WATER FOR INJECTION IJ SOLN
INTRAMUSCULAR | Status: AC
  Administered 2022-10-01: 1.2 mL
  Filled 2022-10-01: qty 10

## 2022-10-01 MED ORDER — MAGNESIUM SULFATE 2 GM/50ML IV SOLN
2.0000 g | INTRAVENOUS | Status: AC
  Administered 2022-10-01: 2 g via INTRAVENOUS
  Filled 2022-10-01: qty 50

## 2022-10-01 MED ORDER — TRAZODONE HCL 100 MG PO TABS
100.0000 mg | ORAL_TABLET | Freq: Every day | ORAL | Status: DC
  Administered 2022-10-02 – 2022-10-07 (×5): 100 mg via ORAL
  Filled 2022-10-01 (×9): qty 1

## 2022-10-01 MED ORDER — MAGNESIUM HYDROXIDE 400 MG/5ML PO SUSP: 30.0000 mL | Freq: Every day | ORAL | Status: DC | PRN

## 2022-10-01 MED ORDER — TRAZODONE HCL 100 MG PO TABS: 100.0000 mg | ORAL_TABLET | Freq: Every day | ORAL | Status: DC

## 2022-10-01 MED ORDER — HALOPERIDOL 5 MG PO TABS
5.0000 mg | ORAL_TABLET | Freq: Three times a day (TID) | ORAL | Status: DC | PRN
  Filled 2022-10-01: qty 1

## 2022-10-01 MED ORDER — DIPHENHYDRAMINE HCL 50 MG/ML IJ SOLN
50.0000 mg | Freq: Once | INTRAMUSCULAR | Status: AC
  Administered 2022-10-01: 50 mg via INTRAMUSCULAR
  Filled 2022-10-01: qty 1

## 2022-10-01 MED ORDER — HALOPERIDOL LACTATE 5 MG/ML IJ SOLN: 5.0000 mg | Freq: Three times a day (TID) | INTRAMUSCULAR | Status: DC | PRN

## 2022-10-01 MED ORDER — POTASSIUM CHLORIDE 10 MEQ/100ML IV SOLN
10.0000 meq | INTRAVENOUS | Status: AC
  Administered 2022-10-01 (×3): 10 meq via INTRAVENOUS
  Filled 2022-10-01 (×3): qty 100

## 2022-10-01 NOTE — ED Notes (Signed)
Patient to room 30. Patient ambulated to room.  Patient oriented to unit and room.

## 2022-10-01 NOTE — ED Provider Notes (Signed)
Psych pt need inpt psych placement, but K+ is 2.4. awaits repeat K+ and if improves then no need for medical admission.   Fortunately repeat potassium is 3.6 and within normal range.  At this time patient is medically clear and can be assessed further by psychiatry.  BP 130/75 (BP Location: Left Arm)   Pulse 91   Temp 97.7 F (36.5 C) (Oral)   Resp 18   SpO2 100%   Results for orders placed or performed during the hospital encounter of 09/30/22  CBC with Differential  Result Value Ref Range   WBC 12.2 (H) 4.0 - 10.5 K/uL   RBC 5.05 4.22 - 5.81 MIL/uL   Hemoglobin 12.8 (L) 13.0 - 17.0 g/dL   HCT 16.1 (L) 09.6 - 04.5 %   MCV 73.7 (L) 80.0 - 100.0 fL   MCH 25.3 (L) 26.0 - 34.0 pg   MCHC 34.4 30.0 - 36.0 g/dL   RDW 40.9 (H) 81.1 - 91.4 %   Platelets 479 (H) 150 - 400 K/uL   nRBC 0.0 0.0 - 0.2 %   Neutrophils Relative % 75 %   Neutro Abs 9.2 (H) 1.7 - 7.7 K/uL   Lymphocytes Relative 17 %   Lymphs Abs 2.1 0.7 - 4.0 K/uL   Monocytes Relative 7 %   Monocytes Absolute 0.9 0.1 - 1.0 K/uL   Eosinophils Relative 0 %   Eosinophils Absolute 0.0 0.0 - 0.5 K/uL   Basophils Relative 1 %   Basophils Absolute 0.1 0.0 - 0.1 K/uL   WBC Morphology VACUOLATED NEUTROPHILS    Abs Immature Granulocytes 0.00 0.00 - 0.07 K/uL  Comprehensive metabolic panel  Result Value Ref Range   Sodium 133 (L) 135 - 145 mmol/L   Potassium 2.4 (LL) 3.5 - 5.1 mmol/L   Chloride 97 (L) 98 - 111 mmol/L   CO2 22 22 - 32 mmol/L   Glucose, Bld 129 (H) 70 - 99 mg/dL   BUN 11 6 - 20 mg/dL   Creatinine, Ser 7.82 0.61 - 1.24 mg/dL   Calcium 9.0 8.9 - 95.6 mg/dL   Total Protein 8.4 (H) 6.5 - 8.1 g/dL   Albumin 4.1 3.5 - 5.0 g/dL   AST 16 15 - 41 U/L   ALT 11 0 - 44 U/L   Alkaline Phosphatase 83 38 - 126 U/L   Total Bilirubin 0.6 0.3 - 1.2 mg/dL   GFR, Estimated >21 >30 mL/min   Anion gap 14 5 - 15  Ethanol  Result Value Ref Range   Alcohol, Ethyl (B) <10 <10 mg/dL  Rapid urine drug screen (hospital performed)   Result Value Ref Range   Opiates NONE DETECTED NONE DETECTED   Cocaine NONE DETECTED NONE DETECTED   Benzodiazepines NONE DETECTED NONE DETECTED   Amphetamines NONE DETECTED NONE DETECTED   Tetrahydrocannabinol NONE DETECTED NONE DETECTED   Barbiturates NONE DETECTED NONE DETECTED  Magnesium  Result Value Ref Range   Magnesium 1.9 1.7 - 2.4 mg/dL  Potassium  Result Value Ref Range   Potassium 3.6 3.5 - 5.1 mmol/L   No results found.    Fayrene Helper, PA-C 10/01/22 1358    Bethann Berkshire, MD 10/02/22 [phone]

## 2022-10-01 NOTE — Progress Notes (Signed)
Pt in his room yelling and naked, pt not making any sense, pt not responding to re-direction.

## 2022-10-01 NOTE — BH Assessment (Signed)
Comprehensive Clinical Assessment (CCA) Note  10/01/2022 ROGEN MESSMER [id number]  Disposition: [name], NP recommends inpatient treatment. CSW to seek placement, if no available beds. Disposition discussed with Antony Madura, PA-C and Arlyce Harman, RN.  The patient demonstrates the following risk factors for suicide: Chronic risk factors for suicide include: psychiatric disorder of Schizophrenia . Acute risk factors for suicide include: N/A. Protective factors for this patient include:  UTA . Considering these factors, the overall suicide risk at this point appears to be no risk. Patient is not appropriate for outpatient follow up.  Veverly Fells. Theus is a 42 year old male who presents voluntary and unaccompanied to [facility]. Clinician asked the pt, "what brought you to the hospital?" Pt did not answer the question clearly, he talked about random topics. Pt denies, SI, HI, AVH, self-injurious behaviors and access to weapons.   Pt denies, substance use. Pt's UDS is positive for Benzodiazepines. Per EDP/PA pt is prescribed medication unsure if pt is taking medications as prescribed. Per chart, pt has previous inpatient admissions at [facility], [facility].   Pt was a very poor historian during the assessment. Pt presents with his shirt off, moving his shoulders up and down with his knees up under a blanket. Pt had his eyes open and closed at times with rambling, pressured speech; pt would answer yes/no questions at times. Pt's mood was anxious. Pt's affect was flat. Pt's insight was lacking. Pt's judgement is poor.   *Pt was unable to provide his father's up to date phone number.*  Chief Complaint:  Chief Complaint  Patient presents with   Psychiatric Evaluation   Visit Diagnosis:  Schizophrenia.    CCA Screening, Triage and Referral (STR)  Patient Reported Information How did you hear about [place]? Family/Friend  What Is the Reason for Your Visit/Call Today? Pt was a  very poor historian during the assessment. Pt presents bizarre, rambling, unable to answer many questions but was able answer some yes/no questions. Pt denies, SI, HI, AVH, self-injurious behaviors and access to weapons.  How Long Has This Been Causing You Problems? 1 wk - 1 month  What Do You Feel Would Help You the Most Today? Medication(s)   Have You Recently Had Any Thoughts About Hurting Yourself? No  Are You Planning to Commit Suicide/Harm Yourself At This time? No   Flowsheet Row ED from 09/03/2022 in [facility] at [facility] ED from 08/10/2022 in [facility] at [facility] ED from 07/13/2022 in [facility] Department at [facility]  C-SSRS RISK CATEGORY No Risk No Risk No Risk       Have you Recently Had Thoughts About Hurting Someone [name]? No  Are You Planning to Harm Someone at This Time? No  Explanation: Pt denies, HI.   Have You Used Any Alcohol or Drugs in the Past 24 Hours? No  What Did You Use and How Much? Pt denies.   Do You Currently Have a Therapist/Psychiatrist? -- (UTA, due to pt's rambling ongoing speech.)  Name of Therapist/Psychiatrist: Name of Therapist/Psychiatrist: UTA, due to pt's rambling ongoing speech.   Have You Been Recently Discharged From Any Office Practice or Programs? -- (UTA, due to pt's rambling ongoing speech.)  Explanation of Discharge From Practice/Program: UTA, due to pt's rambling ongoing speech.     CCA Screening Triage Referral Assessment Type of Contact: Tele-Assessment  Telemedicine Service Delivery: Telemedicine service delivery: This service was provided via telemedicine  using a 2-way, interactive audio and video technology  Is this Initial or Reassessment? Is this Initial or Reassessment?: Initial Assessment  Date Telepsych consult ordered in [facility]:  Date Telepsych consult ordered in [facility]L: 09/30/22  Time Telepsych consult ordered in CHL:  Time  Telepsych consult ordered in CHL: 2350  Location of Assessment: [facility] ED  Provider Location: [facility] Assessment Services   Collateral Involvement: None.   Does Patient Have a Automotive engineer Guardian? No  Legal Guardian Contact Information: Per chart, pt is his own guardian.  Copy of Legal Guardianship Form: -- (Per chart, pt is his own guardian.)  Legal Guardian Notified of Arrival: -- (Per chart, pt is his own guardian.)  Legal Guardian Notified of Pending Discharge: -- (Per chart, pt is his own guardian.)  If Minor and Not Living with Parent(s), Who has Custody? Pt is an adult.  Is CPS involved or ever been involved? -- (UTA due to pt's rambling ongoing speech.)  Is APS involved or ever been involved? -- (UTA due to pt's rambling ongoing speech.)   Patient Determined To Be At Risk for Harm To Self or Others Based on Review of Patient Reported Information or Presenting Complaint? No  Method: No Plan  Availability of Means: No access or NA  Intent: Vague intent or NA  Notification Required: No need or identified person  Additional Information for Danger to Others Potential: -- (Pt denies, HI.)  Additional Comments for Danger to Others Potential: Pt denies, HI.  Are There Guns or Other Weapons in Your Home? No  Types of Guns/Weapons: Pt denies, access to weapons.  Are These Weapons Safely Secured?                            -- (Pt denies, access to weapons.)  Who Could Verify You Are Able To Have These Secured: Pt denies, access to weapons.  Do You Have any Outstanding Charges, Pending Court Dates, Parole/Probation? Pt denies, access to weapons.  Contacted To Inform of Risk of Harm To Self or Others: Other: Comment (None.)    Does Patient Present under Involuntary Commitment? No    [place] of Residence: [place]   Patient Currently Receiving the Following Services: -- (UTA, due to pt's rambling ongoing speech.)   Determination of Need: Emergent (2  hours)   Options For Referral: Inpatient Hospitalization; [facility] Urgent Care; Medication Management; Outpatient Therapy     CCA Biopsychosocial Patient Reported Schizophrenia/Schizoaffective Diagnosis in Past: Yes   Strengths: Pt lives with his father.   Mental Health Symptoms Depression:   Irritability; Difficulty Concentrating   Duration of Depressive symptoms:  Duration of Depressive Symptoms: -- (UTA, due to pt's rambling speech.)   Mania:   -- (UTA, due to pt's rambling speech.)   Anxiety:    Irritability; Restlessness   Psychosis:   None   Duration of Psychotic symptoms:  Duration of Psychotic Symptoms: N/A   Trauma:   -- (UTA, due to pt's rambling speech.)   Obsessions:   -- (UTA, due to pt's rambling speech.)   Compulsions:   -- (UTA, due to pt's rambling speech.)   Inattention:   Disorganized   Hyperactivity/Impulsivity:   Feeling of restlessness   Oppositional/Defiant Behaviors:   Angry   Emotional Irregularity:   -- (UTA, due to pt's rambling speech.)   Other Mood/Personality Symptoms:   UTA, due to pt's rambling ongoing speech.    Mental Status Exam Appearance and self-care  Stature:   Average   Weight:   Average weight   Clothing:   -- (Pt in hospital bed with his shirt off.)   Grooming:   Normal   Cosmetic use:   None   Posture/gait:   Other (Comment) (Pt laying in hospital bed.)   Motor activity:   Restless; Repetitive   Sensorium  Attention:   Confused   Concentration:   Scattered   Orientation:   -- (None.)   Recall/memory:   Defective in Immediate; Defective in Short-term   Affect and Mood  Affect:   Flat   Mood:   Anxious   Relating  Eye contact:   Fleeting   Facial expression:   Anxious   Attitude toward examiner:   Cooperative   Thought and Language  Speech flow:  Pressured; Flight of Ideas; Other (Comment) (Rambling.)   Thought content:   -- (UTA, due to pt's rambling speech.)    Preoccupation:   None   Hallucinations:   None   Organization:   Disorganized   Company secretary of Knowledge:   Poor   Intelligence:   Average   Abstraction:   Concrete   Judgement:   Poor   Reality Testing:   Distorted   Insight:   Lacking   Decision Making:   Confused   Social Functioning  Social Maturity:   Impulsive   Social Judgement:   Heedless   Stress  Stressors:   Other (Comment) (UTA, due to pt's rambling speech.)   Coping Ability:   -- (UTA, due to pt's rambling speech.)   Skill Deficits:   Communication; Decision making; Self-control   Supports:   -- (UTA, due to pt's rambling speech.)     Religion: Religion/Spirituality Are You A Religious Person?:  (UTA, due to pt's rambling speech.) What is Your Religious Affiliation?:  (UTA, due to pt's rambling speech.) How Might This Affect Treatment?: UTA, due to pt's rambling speech.  Leisure/Recreation: Leisure / Recreation Do You Have Hobbies?:  (UTA, due to pt's rambling speech.)  Exercise/Diet: Exercise/Diet Do You Exercise?:  (UTA, due to pt's rambling speech.) Have You Gained or Lost A Significant Amount of Weight in the Past Six Months?:  (UTA, due to pt's rambling speech.) Do You Follow a Special Diet?:  (UTA, due to pt's rambling speech.) Do You Have Any Trouble Sleeping?:  (UTA, due to pt's rambling speech.) Explanation of Sleeping Difficulties: UTA, due to pt's rambling speech.   CCA Employment/Education Employment/Work Situation: Employment / Work Situation Employment Situation: On disability Why is Patient on Disability: UTA, due to pt's rambling speech. How Long has Patient Been on Disability: UTA, due to pt's rambling speech. Patient's Job has Been Impacted by Current Illness:  (UTA, due to pt's rambling speech.) Has Patient ever Been in the Military?:  (UTA, due to pt's rambling speech.)  Education: Education Is Patient Currently Attending School?:   (UTA, due to pt's rambling speech.) Last Grade Completed:  (UTA, due to pt's rambling speech.) Did You Attend College?:  (UTA, due to pt's rambling speech.) Did You Have An Individualized Education Program (IIEP):  (UTA, due to pt's rambling speech.) Did You Have Any Difficulty At School?:  (UTA, due to pt's rambling speech.) Patient's Education Has Been Impacted by Current Illness:  (UTA, due to pt's rambling speech.)   CCA Family/Childhood History Family and Relationship History: Family history Marital status:  (UTA, due to pt's rambling speech.) Does patient have children?:  (UTA, due to pt's rambling  speech.)  Childhood History:  Childhood History By whom was/is the patient raised?:  (UTA, due to pt's rambling speech.) Did patient suffer any verbal/emotional/physical/sexual abuse as a child?:  (UTA, due to pt's rambling speech.) Did patient suffer from severe childhood neglect?:  (UTA, due to pt's rambling speech.) Has patient ever been sexually abused/assaulted/raped as an adolescent or adult?:  (UTA, due to pt's rambling speech.) Was the patient ever a victim of a crime or a disaster?:  (UTA, due to pt's rambling speech.) Witnessed domestic violence?:  (UTA, due to pt's rambling speech.) Has patient been affected by domestic violence as an adult?:  (UTA, due to pt's rambling speech.)   CCA Substance Use Alcohol/Drug Use: Alcohol / Drug Use Pain Medications: See MAR Prescriptions: See MAR Over the Counter: See MAR History of alcohol / drug use?: No history of alcohol / drug abuse Longest period of sobriety (when/how long): Pt denies substance use. Negative Consequences of Use:  (Pt denies substance use.) Withdrawal Symptoms:  (Pt denies substance use.)    ASAM's:  Six Dimensions of Multidimensional Assessment  Dimension 1:  Acute Intoxication and/or Withdrawal Potential:   Dimension 1:  Description of individual's past and current experiences of substance use and  withdrawal: Pt denies substance use.  Dimension 2:  Biomedical Conditions and Complications:   Dimension 2:  Description of patient's biomedical conditions and  complications: Pt denies substance use.  Dimension 3:  Emotional, Behavioral, or Cognitive Conditions and Complications:  Dimension 3:  Description of emotional, behavioral, or cognitive conditions and complications: Pt denies substance use.  Dimension 4:  Readiness to Change:  Dimension 4:  Description of Readiness to Change criteria: Pt denies substance use.  Dimension 5:  Relapse, Continued use, or Continued Problem Potential:  Dimension 5:  Relapse, continued use, or continued problem potential critiera description: Pt denies substance use.  Dimension 6:  Recovery/Living Environment:  Dimension 6:  Recovery/Iiving environment criteria description: Pt denies substance use.  ASAM Severity Score:    ASAM Recommended Level of Treatment: ASAM Recommended Level of Treatment:  (Pt denies substance use.)   Substance use Disorder (SUD) Substance Use Disorder (SUD)  Checklist Symptoms of Substance Use:  (Pt denies substance use.)  Recommendations for Services/Supports/Treatments: Recommendations for Services/Supports/Treatments Recommendations For Services/Supports/Treatments: Inpatient Hospitalization  Discharge Disposition: Discharge Disposition Medical Exam completed: Yes  DSM5 Diagnoses: Patient Active Problem List   Diagnosis Date Noted   Altered mental status, unspecified 08/11/2022   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   Tardive dyskinesia 02/20/2021     Referrals to Alternative Service(s): Referred to Alternative Service(s):   Place:   Date:   Time:    Referred to Alternative Service(s):   Place:   Date:   Time:    Referred to Alternative Service(s):   Place:   Date:   Time:    Referred to Alternative Service(s):   Place:   Date:   Time:     Redmond Pulling,  [facility] Comprehensive Clinical Assessment (CCA) Screening, Triage and Referral Note  10/01/2022 TATSUMI DOWTY [id number]  Chief Complaint:  Chief Complaint  Patient presents with   Psychiatric Evaluation   Visit Diagnosis:  Patient Reported Information How did you hear about [place]? Family/Friend  What Is the Reason for Your Visit/Call Today? Pt was a very poor historian during the assessment. Pt presents bizarre, rambling, unable to answer many questions but was able answer some yes/no questions. Pt denies, SI, HI, AVH, self-injurious behaviors  and access to weapons.  How Long Has This Been Causing You Problems? 1 wk - 1 month  What Do You Feel Would Help You the Most Today? Medication(s)   Have You Recently Had Any Thoughts About Hurting Yourself? No  Are You Planning to Commit Suicide/Harm Yourself At This time? No   Have you Recently Had Thoughts About Hurting Someone [name]? No  Are You Planning to Harm Someone at This Time? No  Explanation: Pt denies, HI.   Have You Used Any Alcohol or Drugs in the Past 24 Hours? No  How Long Ago Did You Use Drugs or Alcohol? Pt denies, substance use.  What Did You Use and How Much? Pt denies.   Do You Currently Have a Therapist/Psychiatrist? -- (UTA, due to pt's rambling ongoing speech.)  Name of Therapist/Psychiatrist: UTA, due to pt's rambling ongoing speech.   Have You Been Recently Discharged From Any Office Practice or Programs? -- (UTA, due to pt's rambling ongoing speech.)  Explanation of Discharge From Practice/Program: UTA, due to pt's rambling ongoing speech.    CCA Screening Triage Referral Assessment Type of Contact: Tele-Assessment  Telemedicine Service Delivery: Telemedicine service delivery: This service was provided via telemedicine using a 2-way, interactive audio and video technology  Is this Initial or Reassessment? Is this Initial or Reassessment?: Initial Assessment  Date Telepsych consult ordered in [facility]:   Date Telepsych consult ordered in CHL: 09/30/22  Time Telepsych consult ordered in CHL:  Time Telepsych consult ordered in CHL: 2350  Location of Assessment: [facility]  Provider Location: [facility] Assessment Services    Collateral Involvement: None.   Does Patient Have a Automotive engineer Guardian? No. Name and Contact of Legal Guardian: Per chart, pt is his own guardian.  If Minor and Not Living with Parent(s), Who has Custody? Pt is an adult.  Is CPS involved or ever been involved? -- (UTA due to pt's rambling ongoing speech.)  Is APS involved or ever been involved? -- (UTA due to pt's rambling ongoing speech.)   Patient Determined To Be At Risk for Harm To Self or Others Based on Review of Patient Reported Information or Presenting Complaint? No  Method: No Plan  Availability of Means: No access or NA  Intent: Vague intent or NA  Notification Required: No need or identified person  Additional Information for Danger to Others Potential: -- (Pt denies, HI.)  Additional Comments for Danger to Others Potential: Pt denies, HI.  Are There Guns or Other Weapons in Your Home? No  Types of Guns/Weapons: Pt denies, access to weapons.  Are These Weapons Safely Secured?                            -- (Pt denies, access to weapons.)  Who Could Verify You Are Able To Have These Secured: Pt denies, access to weapons.  Do You Have any Outstanding Charges, Pending Court Dates, Parole/Probation? Pt denies, access to weapons.  Contacted To Inform of Risk of Harm To Self or Others: Other: Comment (None.)   Does Patient Present under Involuntary Commitment? No    [place] of Residence: [place]   Patient Currently Receiving the Following Services: -- (UTA, due to pt's rambling ongoing speech.)   Determination of Need: Emergent (2 hours)   Options For Referral: Inpatient Hospitalization; [facility] Urgent Care; Medication Management; Outpatient Therapy   Discharge Disposition:   Discharge Disposition Medical Exam completed: Yes  Redmond Pulling, [facility]  Redmond Pulling, [place], [facility], [facility] Triage Specialist [phone]

## 2022-10-01 NOTE — ED Notes (Signed)
Patient off unit to [facility] per provider. Patient alert and no s/s of distress. Patient discharge information and belongings given to [employer] for transport. Patient ambulatory off unit,  escorted and transported by [employer].

## 2022-10-01 NOTE — BH Assessment (Signed)
Clinician messaged Montey Hora, RN: "Hey. It's Trey with TTS. Is the pt able to engage in the assessment, if so the pt will need to be placed in a private room. Is the pt under IVC? Also is the pt medically cleared?"   Redmond Pulling, [place], [facility], [facility] Triage Specialist [phone]

## 2022-10-01 NOTE — Progress Notes (Signed)
Patient arrived on the 500 hall, staff was unable to get vital signs, weight, or height due to the patient was unable to remain still. Patient is shaking and has difficulty following directions.

## 2022-10-01 NOTE — Progress Notes (Signed)
Pt refused HS medications, pt disorganized and incoherent at times  Pt came to the medication window and drank his drink and walked away without taking his medications in the med cup, when pt was called back to the medication window, pt continued to walk away and mumbles incoherent speech.    10/01/22 2215  Psych Admission Type (Psych Patients Only)  Admission Status Involuntary  Psychosocial Assessment  Patient Complaints Disorientation  Eye Contact Avertive  Facial Expression Blank;Flat  Affect Preoccupied  Speech Tangential;Incoherent  Interaction Cautious;Evasive;Forwards little  Motor Activity Pacing  Appearance/Hygiene Body odor;Disheveled  Behavior Characteristics Unwilling to participate;Resistant to care;Restless;Pacing  Mood Suspicious;Preoccupied  Aggressive Behavior  Effect No apparent injury  Thought Process  Coherency Disorganized;Loose associations;Tangential  Content Confabulation  Delusions Paranoid  Perception Derealization;Depersonalization  Hallucination Auditory  Judgment Impaired  Confusion Mild  Danger to Self  Current suicidal ideation? Denies

## 2022-10-01 NOTE — Progress Notes (Addendum)
Pt has been accepted to [facility] TODAY 10/01/2022, pending stable BP and IVC paperwork. Bed assignment: 503-1  Pt meets inpatient criteria per Dahlia Byes, NP  Attending Physician will be Phineas Inches, MD  Report can be called to: - Adult unit: [phone]  Pt can arrive after pending items are received  Care Team Notified: [facility] Rona Ravens, RN, Lum Babe, RN, Su Grand, RN, and Dahlia Byes, NP  [place], [place]  10/01/2022 2:36 PM

## 2022-10-01 NOTE — Progress Notes (Signed)
Pt came to the medication window, pt has disorganized thought , pt refused PO medications, pt has flight of ideas , pt pacing the unit talking to people not seen by staff.

## 2022-10-01 NOTE — ED Notes (Signed)
Patient continues to ramble and be elevated at times. Redirectable.

## 2022-10-01 NOTE — Progress Notes (Signed)
Pt has become increasingly agitated throughout the night. Pt in his room, with no clothes on, is slamming his bathroom door repeatedly and screaming. Pt advised by writer to keep the noise down due to him waking up other pts on the unit. Nurse notified of situation.

## 2022-10-01 NOTE — Progress Notes (Signed)
Pt given PRN Haldol and Benadryl per MAR IM without incident . Pt accepted the medications

## 2022-10-01 NOTE — ED Notes (Signed)
Patient rambling.  Elevated behaviors.

## 2022-10-01 NOTE — [facility] Group Notes (Signed)
Pt did not attend wrap-up group   

## 2022-10-01 NOTE — ED Notes (Signed)
Pt. Has eaten breakfast and in laying comfortably in bed. Pt has been provided a urinal at bedside.

## 2022-10-02 ENCOUNTER — Encounter (HOSPITAL_COMMUNITY): Payer: Self-pay

## 2022-10-02 MED ORDER — LORAZEPAM 1 MG PO TABS: 2.0000 mg | ORAL_TABLET | Freq: Four times a day (QID) | ORAL | Status: DC | PRN

## 2022-10-02 MED ORDER — LORAZEPAM 2 MG/ML IJ SOLN
2.0000 mg | Freq: Four times a day (QID) | INTRAMUSCULAR | Status: DC | PRN
  Administered 2022-10-02: 2 mg via INTRAMUSCULAR
  Filled 2022-10-02: qty 1

## 2022-10-02 MED ORDER — HALOPERIDOL 5 MG PO TABS
10.0000 mg | ORAL_TABLET | Freq: Once | ORAL | Status: AC
  Administered 2022-10-02: 10 mg via ORAL
  Filled 2022-10-02: qty 2

## 2022-10-02 NOTE — Plan of Care (Signed)
  Problem: Coping: Goal: Ability to demonstrate self-control will improve Outcome: Progressing   Problem: Health Behavior/Discharge Planning: Goal: Compliance with treatment plan for underlying cause of condition will improve Outcome: Progressing   Problem: Safety: Goal: Periods of time without injury will increase Outcome: Progressing   Problem: Education: Goal: Emotional status will improve Outcome: Not Progressing Goal: Mental status will improve Outcome: Not Progressing

## 2022-10-02 NOTE — Progress Notes (Signed)
   10/02/22 0556  15 Minute Checks  Location Bedroom  Visual Appearance Calm  Behavior Anxious  Sleep (Behavioral Health Patients Only)  Calculate sleep? (Click Yes once per 24 hr at 0600 safety check) Yes  Documented sleep last 24 hours 1

## 2022-10-02 NOTE — Progress Notes (Signed)
Nursing 1:1 note D:Pt observed sleeping in bed with eyes closed. RR even and unlabored. No distress noted. A: 1:1 observation continues for safety  R: pt remains safe  

## 2022-10-02 NOTE — Group Note (Signed)
Recreation Therapy Group Note   Group Topic:Other  Group Date: 10/02/2022 Start Time: 1015 End Time: 1045 Facilitators: Kasten Leveque-McCall, LRT,CTRS Location: [address] Day[address]   Goal Area(s) Addresses:  Patient will identify the benefits of music.  Patient will identify some of the emotions that music can bring.  Group Description: Music Therapy. LRT and patients discussed the importance of music and how it can affect emotions and mood. LRT allowed patients to pick the songs of their choosing to play in group. Patients had to choose songs that were clean/appropriate and it's affects.   Affect/Mood: N/A   Participation Level: Did not attend    Clinical Observations/Individualized Feedback:      Plan: Continue to engage patient in RT group sessions 2-3x/week.   Rakeen Gaillard-McCall, LRT,CTRS 10/02/2022 12:22 PM
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
## 2022-10-02 NOTE — Progress Notes (Signed)
Recreation Therapy Notes  Patient is unable to be assessed at this time due to agitation and erratic behavior. LRT will attempt to complete assessment at a later time when patient is more stable.     Gyasi Hazzard-McCall, LRT,CTRS Lidia Clavijo A Emmani Lesueur-McCall 10/02/2022 12:40 PM

## 2022-10-02 NOTE — [facility] Group Notes (Signed)
Pt did not attend wrap-up group   

## 2022-10-02 NOTE — BH IP Treatment Plan (Signed)
Interdisciplinary Treatment and Diagnostic Plan Initial  10/02/2022 Time of Session: 110[address] MRN: [id number]  Principal Diagnosis: Paranoid schizophrenia [facility])  Secondary Diagnoses: Principal Problem:   Paranoid schizophrenia (HCC)   Current Medications:  Current Facility-Administered Medications  Medication Dose Route Frequency Provider Last Rate Last Admin   acetaminophen (TYLENOL) tablet 650 mg  650 mg Oral Q6H PRN Dahlia Byes C, NP       alum & mag hydroxide-simeth (MAALOX/MYLANTA) 200-200-20 MG/5ML suspension 30 mL  30 mL Oral Q4H PRN Welford Roche, Josephine C, NP       amantadine (SYMMETREL) capsule 100 mg  100 mg Oral BID Dahlia Byes C, NP       diphenhydrAMINE (BENADRYL) capsule 25 mg  25 mg Oral Q8H PRN Dahlia Byes C, NP       Or   diphenhydrAMINE (BENADRYL) injection 25 mg  25 mg Intramuscular Q8H PRN Dahlia Byes C, NP   25 mg at 10/02/22 1259   haloperidol (HALDOL) tablet 5 mg  5 mg Oral Q8H PRN Dahlia Byes C, NP       Or   haloperidol lactate (HALDOL) injection 5 mg  5 mg Intramuscular Q8H PRN Dahlia Byes C, NP   5 mg at 10/02/22 1300   LORazepam (ATIVAN) tablet 2 mg  2 mg Oral Q6H PRN Lauro Franklin, MD       Or   LORazepam (ATIVAN) injection 2 mg  2 mg Intramuscular Q6H PRN Lauro Franklin, MD   2 mg at 10/02/22 1301   magnesium hydroxide (MILK OF MAGNESIA) suspension 30 mL  30 mL Oral Daily PRN Dahlia Byes C, NP       traZODone (DESYREL) tablet 100 mg  100 mg Oral QHS Onuoha, Josephine C, NP       PTA Medications: Medications Prior to Admission  Medication Sig Dispense Refill Last Dose   amantadine (SYMMETREL) 100 MG capsule Take 100 mg by mouth 2 (two) times daily.      traZODone (DESYREL) 100 MG tablet Take 100 mg by mouth at bedtime.       Patient Stressors:    Patient Strengths:    Treatment Modalities: Medication Management, Group therapy, Case management,  1 to 1 session with clinician,  Psychoeducation, Recreational therapy.   Physician Treatment Plan for Primary Diagnosis: Paranoid schizophrenia (HCC) Long Term Goal(s): Improvement in symptoms so as ready for discharge   Short Term Goals: Ability to identify changes in lifestyle to reduce recurrence of condition will improve Ability to maintain clinical measurements within normal limits will improve Ability to identify triggers associated with substance abuse/mental health issues will improve  Medication Management: Evaluate patient's response, side effects, and tolerance of medication regimen.  Therapeutic Interventions: 1 to 1 sessions, Unit Group sessions and Medication administration.  Evaluation of Outcomes: Not Progressing  Physician Treatment Plan for Secondary Diagnosis: Principal Problem:   Paranoid schizophrenia (HCC)  Long Term Goal(s): Improvement in symptoms so as ready for discharge   Short Term Goals: Ability to identify changes in lifestyle to reduce recurrence of condition will improve Ability to maintain clinical measurements within normal limits will improve Ability to identify triggers associated with substance abuse/mental health issues will improve     Medication Management: Evaluate patient's response, side effects, and tolerance of medication regimen.  Therapeutic Interventions: 1 to 1 sessions, Unit Group sessions and Medication administration.  Evaluation of Outcomes: Not Progressing   RN Treatment Plan for Primary Diagnosis: Paranoid schizophrenia (HCC) Long Term Goal(s): Knowledge  of disease and therapeutic regimen to maintain health will improve  Short Term Goals: Ability to remain free from injury will improve, Ability to verbalize frustration and anger appropriately will improve, Ability to demonstrate self-control, Ability to participate in decision making will improve, Ability to verbalize feelings will improve, Ability to disclose and discuss suicidal ideas, Ability to identify  and develop effective coping behaviors will improve, and Compliance with prescribed medications will improve  Medication Management: RN will administer medications as ordered by provider, will assess and evaluate patient's response and provide education to patient for prescribed medication. RN will report any adverse and/or side effects to prescribing provider.  Therapeutic Interventions: 1 on 1 counseling sessions, Psychoeducation, Medication administration, Evaluate responses to treatment, Monitor vital signs and CBGs as ordered, Perform/monitor CIWA, COWS, AIMS and Fall Risk screenings as ordered, Perform wound care treatments as ordered.  Evaluation of Outcomes: Not Progressing   LCSW Treatment Plan for Primary Diagnosis: Paranoid schizophrenia (HCC) Long Term Goal(s): Safe transition to appropriate next level of care at discharge, Engage patient in therapeutic group addressing interpersonal concerns.  Short Term Goals: Engage patient in aftercare planning with referrals and resources, Increase social support, Increase ability to appropriately verbalize feelings, Increase emotional regulation, Facilitate acceptance of mental health diagnosis and concerns, Facilitate patient progression through stages of change regarding substance use diagnoses and concerns, Identify triggers associated with mental health/substance abuse issues, and Increase skills for wellness and recovery  Therapeutic Interventions: Assess for all discharge needs, 1 to 1 time with Social worker, Explore available resources and support systems, Assess for adequacy in community support network, Educate family and significant other(s) on suicide prevention, Complete Psychosocial Assessment, Interpersonal group therapy.  Evaluation of Outcomes: Not Progressing   Progress in Treatment: Attending groups: No. Participating in groups: No. Taking medication as prescribed: No. Toleration medication: Yes. Family/Significant other  contact made: No, will contact:  Pt is disorganized Patient understands diagnosis: No. Discussing patient identified problems/goals with staff: Yes. Medical problems stabilized or resolved: Yes. Denies suicidal/homicidal ideation: No. Issues/concerns per patient self-inventory: Yes. Other: N/A  New problem(s) identified: No, Describe:  None reported  New Short Term/Long Term Goal(s):  Patient Goals:  None stated  Discharge Plan or Barriers: Patient recently admitted. CSW will continue to follow and assess for appropriate referrals and possible discharge planning.   Reason for Continuation of Hospitalization: Delusions  Hallucinations Medication stabilization  Estimated Length of Stay: 5-7 Days  Last 3 [place] Suicide Severity Risk Score: Flowsheet Row ED from 09/30/2022 in [facility] Emergency Department at [facility] ED from 09/03/2022 in [facility] Emergency Department at [facility] ED from 08/10/2022 in [facility] Emergency Department at [facility]  C-SSRS RISK CATEGORY No Risk No Risk No Risk       Last [facility] 2/9 Scores:    08/03/2021    3:05 PM 02/20/2021    4:52 PM 11/28/2020    3:55 PM  Depression screen PHQ 2/9  Decreased Interest 1 1 1   Down, Depressed, Hopeless 1 2 1   PHQ - 2 Score 2 3 2   Altered sleeping 0 2 1  Tired, decreased energy 0 1 1  Change in appetite 1 2 1   Feeling bad or failure about yourself  0 1 1  Trouble concentrating 1 1 1   Moving slowly or fidgety/restless 1 1 1   Suicidal thoughts 0 0 1  PHQ-9 Score 5 11 9   Difficult doing work/chores Somewhat difficult Very difficult Somewhat difficult   medication  stabilization, elimination of SI thoughts, development of comprehensive mental wellness plan.   Scribe for Treatment Team: Ane Payment, LCSW 10/02/2022 2:55 PM

## 2022-10-02 NOTE — Progress Notes (Signed)
Pt continues to be disorganized with his speech, pt did take his HS PO medications     10/02/22 2045  Psych Admission Type (Psych Patients Only)  Admission Status Involuntary  Psychosocial Assessment  Patient Complaints Restlessness;Suspiciousness;Disorientation  Eye Contact Avertive  Facial Expression Blank;Flat  Affect Preoccupied  Speech Tangential;Incoherent  Interaction Cautious;Evasive;Forwards little  Motor Activity Pacing  Appearance/Hygiene Body odor;Disheveled  Behavior Characteristics Cooperative;Guarded  Mood Suspicious;Labile  Aggressive Behavior  Effect No apparent injury  Thought Process  Coherency Disorganized;Loose associations;Tangential  Content Confabulation  Delusions Paranoid  Perception Derealization;Depersonalization  Hallucination Auditory  Judgment Impaired  Confusion Mild  Danger to Self  Current suicidal ideation? Denies

## 2022-10-02 NOTE — Plan of Care (Signed)
  Problem: Safety: Goal: Periods of time without injury will increase Outcome: Progressing   
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
## 2022-10-02 NOTE — Progress Notes (Signed)
Patient was agitated, pacing around his room. He exited his room and began to restlessly pace the hallway. Patient appeared to be responding to internal stimuli, with loud pressured and disorganized  speech. His words were illogical and incoherent. Required several people to redirect him back to his room, at that time he attempted to enter another patient's room before going back to his. While in his room, patient was witnessed pacing anxiously, and speaking loudly. He than began to spit on the floor repeatedly, and declined to clean it up stating, "I'm not sick with hepatitis". PRN ativan, haldol and benadryl given for agitation, with positive effect.     10/02/22 1218  Psych Admission Type (Psych Patients Only)  Admission Status Involuntary  Psychosocial Assessment  Patient Complaints Restlessness;Suspiciousness  Eye Contact Avertive  Facial Expression Blank;Flat  Affect Irritable;Preoccupied;Anxious  Speech Tangential;Word salad;Incoherent;Pressured;Loud;Aggressive  Interaction Hostile;Cautious;Forwards little  Motor Activity Pacing;Fidgety;Posturing  Appearance/Hygiene Body odor;In hospital gown;Disheveled  Behavior Characteristics Pacing;Resistant to care;Fidgety;Anxious;Unable to participate  Mood Preoccupied;Suspicious  Aggressive Behavior  Effect No apparent injury  Thought Process  Coherency Incoherent;Disorganized;Loose associations  Content Confabulation  Delusions Paranoid  Perception Depersonalization  Hallucination Auditory  Judgment Impaired  Confusion Mild  Danger to Self  Current suicidal ideation? Denies  Danger to Others  Danger to Others None reported or observed

## 2022-10-03 NOTE — Progress Notes (Signed)
Nursing 1:1 note   Patient calm and cooperative on the unit. He has been speaking with staff some today and able to understand and redirect. He took his PO medications this morning without incident. Remains on 1:1 for safety. No disrobing noted today, he has remained appropriate.

## 2022-10-03 NOTE — Group Note (Signed)
Recreation Therapy Group Note   Group Topic:Communication  Group Date: 10/03/2022 Start Time: 0955 End Time: 1037 Facilitators: Geniene List-McCall, LRT,CTRS Location: [address] Dayroom   Goal Area(s) Addresses:  Patient will effectively listen to complete activity.  Patient will identify communication skills used to make activity successful.  Patient will identify how skills used during activity can be used to reach post d/c goals.    Group Description: Geometric Drawings.  Three volunteers from the peer group will be shown an abstract picture with a particular arrangement of geometrical shapes.  Each round, one 'speaker' will describe the pattern, as accurately as possible without revealing the image to the group.  The remaining group members will listen and draw the picture to reflect how it is described to them. Patients with the role of 'listener' cannot ask clarifying questions but, may request that the speaker repeat a direction. Once the drawings are complete, the presenter will show the rest of the group the picture and compare how close each person came to drawing the picture. LRT will facilitate a post-activity discussion regarding effective communication and the importance of planning, listening, and asking for clarification in daily interactions with others.   Affect/Mood: N/A   Participation Level: Did not attend    Clinical Observations/Individualized Feedback:     Plan: Continue to engage patient in RT group sessions 2-3x/week.   Selita Staiger-McCall, LRT,CTRS  10/03/2022 10:57 AM

## 2022-10-03 NOTE — Plan of Care (Signed)
  Problem: Education: Goal: Emotional status will improve Outcome: Not Progressing Goal: Mental status will improve Outcome: Not Progressing   Problem: Activity: Goal: Interest or engagement in activities will improve Outcome: Not Progressing   

## 2022-10-03 NOTE — Progress Notes (Signed)
Patient is awake and keeps opening up the door but is not making any sense when he speaks. He just started talking about "[employer]" and credit cards and someone by the name of Shanice. His hands have started flapping again.

## 2022-10-03 NOTE — Progress Notes (Signed)
[facility] MD Progress Note  10/03/2022 5:59 PM [name]  MRN:  [id number] Subjective:   [name] is a 42 yr old male who presented on 8/26 to [facility] with bizarre behaviors and hallucinations.  PPHx is significant for Schizophrenia, Tardive Dyskinesia, Hydrocephalus SP Shunt, and Multiple Psychiatric Hospitalizations (last- [name] 02/2022).    Case was discussed in the multidisciplinary team. MAR was reviewed and patient was compliant with medications.  He received PRN Agitation Haldol, Benadryl, and Ativan around noon yesterday.   Psychiatric Team made the following recommendations yesterday: -Start Haldol 10 mg BID this evening. -Continue Agitation Protocol: Haldol/Ativan/Benadryl -Uphold IVC -On 1:1     On interview today patient reports he and they slept good and poor last night.  He and They reports his and their appetite is doing good and poor.  He and They reports no SI, HI, or AVH.  He and They reports no Paranoia or Ideas of Reference.  He and They reports no issues with his and her medications.  Discussed starting Haldol LAI.  He reports that he does not want to get an injection and continue with the oral tablets.  He reports no other concerns at present.  Principal Problem: Paranoid schizophrenia (HCC) Diagnosis: Principal Problem:   Paranoid schizophrenia (HCC)  Total Time spent with patient:  I personally spent 35 minutes on the unit in direct patient care. The direct patient care time included face-to-face time with the patient, reviewing the patient's chart, communicating with other professionals, and coordinating care. Greater than 50% of this time was spent in counseling or coordinating care with the patient regarding goals of hospitalization, psycho-education, and discharge planning needs.   Past Psychiatric History: Schizophrenia, Tardive Dyskinesia, Hydrocephalus SP Shunt, and Multiple Psychiatric Hospitalizations (last- [name] 02/2022).   Past  Medical History:  Past Medical History:  Diagnosis Date   Schizophrenia [facility])     Past Surgical History:  Procedure Laterality Date   shunt surgery     Family History:  Family History  Problem Relation Age of Onset   Stroke Mother    Thyroid disease Father    Family Psychiatric  History:  Maternal Aunt: some unknown diagnosis No known Substance Abuse or Suicides   Social History:  Social History   Substance and Sexual Activity  Alcohol Use Not Currently     Social History   Substance and Sexual Activity  Drug Use Not Currently   Types: Marijuana    Social History   Socioeconomic History   Marital status: Single    Spouse name: Not on file   Number of children: Not on file   Years of education: Not on file   Highest education level: Not on file  Occupational History   Not on file  Tobacco Use   Smoking status: Every Day    Types: Cigars   Smokeless tobacco: Never  Vaping Use   Vaping status: Former  Substance and Sexual Activity   Alcohol use: Not Currently   Drug use: Not Currently    Types: Marijuana   Sexual activity: Not on file  Other Topics Concern   Not on file  Social History Narrative   Not on file   Social Determinants of Health   Financial Resource Strain: Patient Declined (02/10/2022)   Received from [facility], [facility]   Overall Financial Resource Strain (CARDIA)    Difficulty of Paying Living Expenses: Patient declined  Food Insecurity: Food Insecurity Present (02/11/2022)  Received from [facility], [facility]   Hunger Vital Sign    Worried About Running Out of Food in the Last Year: Sometimes true    Ran Out of Food in the Last Year: Sometimes true  Transportation Needs: Patient Declined (02/10/2022)   Received from [facility], [facility]   [facility] - Transportation    Lack of Transportation (Medical): Patient declined    Lack of Transportation (Non-Medical): Patient declined  Physical Activity: Inactive  (02/10/2022)   Received from [facility], [facility]   Exercise Vital Sign    Days of Exercise per Week: 0 days    Minutes of Exercise per Session: 0 min  Stress: Patient Declined (02/10/2022)   Received from [facility], [facility] of Occupational Health - Occupational Stress Questionnaire    Feeling of Stress : Patient declined  Social Connections: Patient Declined (02/10/2022)   Received from [facility], [facility]   Social Connection and Isolation Panel [NHANES]    Frequency of Communication with Friends and Family: Patient declined    Frequency of Social Gatherings with Friends and Family: Patient declined    Attends Religious Services: Patient declined    Database administrator or Organizations: Patient declined    Attends Engineer, structural: Patient declined    Marital Status: Patient declined   Additional Social History:                         Sleep: Good  Appetite:  Good  Current Medications: Current Facility-Administered Medications  Medication Dose Route Frequency Provider Last Rate Last Admin   acetaminophen (TYLENOL) tablet 650 mg  650 mg Oral Q6H PRN Onuoha, Josephine C, NP       alum & mag hydroxide-simeth (MAALOX/MYLANTA) 200-200-20 MG/5ML suspension 30 mL  30 mL Oral Q4H PRN Dahlia Byes C, NP       amantadine (SYMMETREL) capsule 100 mg  100 mg Oral BID Dahlia Byes C, NP   100 mg at 10/03/22 1637   diphenhydrAMINE (BENADRYL) capsule 25 mg  25 mg Oral Q8H PRN Dahlia Byes C, NP       Or   diphenhydrAMINE (BENADRYL) injection 25 mg  25 mg Intramuscular Q8H PRN Onuoha, Josephine C, NP   25 mg at 10/02/22 1259   haloperidol (HALDOL) tablet 5 mg  5 mg Oral Q8H PRN Dahlia Byes C, NP       Or   haloperidol lactate (HALDOL) injection 5 mg  5 mg Intramuscular Q8H PRN Onuoha, Josephine C, NP   5 mg at 10/02/22 1300   LORazepam (ATIVAN) tablet 2 mg  2 mg Oral Q6H PRN Lauro Franklin, MD       Or   LORazepam (ATIVAN) injection 2 mg  2 mg Intramuscular Q6H PRN Lauro Franklin, MD   2 mg at 10/02/22 1301   magnesium hydroxide (MILK OF MAGNESIA) suspension 30 mL  30 mL Oral Daily PRN Dahlia Byes C, NP       traZODone (DESYREL) tablet 100 mg  100 mg Oral QHS Onuoha, Josephine C, NP   100 mg at 10/02/22 2007    Lab Results:  No results found for this or any previous visit (from the past 48 hour(s)).   Blood Alcohol level:  Lab Results  Component Value Date   ETH <10 10/01/2022   ETH <10 09/03/2022  Metabolic Disorder Labs: Lab Results  Component Value Date   HGBA1C 5.6 02/09/2022   MPG 114 02/09/2022   MPG 108.28 08/06/2021   Lab Results  Component Value Date   PROLACTIN 10.2 02/09/2022   Lab Results  Component Value Date   CHOL 134 02/09/2022   TRIG 45 02/09/2022   HDL 57 02/09/2022   CHOLHDL 2.4 02/09/2022   VLDL 9 02/09/2022   LDLCALC 68 02/09/2022   LDLCALC 73 08/06/2021    Physical Findings: AIMS:  , ,  ,  ,    CIWA:    COWS:     Musculoskeletal: Strength & Muscle Tone: within normal limits Gait & Station: normal Patient leans: N/A  Psychiatric Specialty Exam:  Presentation  General Appearance:  Appropriate for Environment  Eye Contact: Fair  Speech: -- (mix of clear and garbled)  Speech Volume: Normal  Handedness: Right   Mood and Affect  Mood: -- ("fine")  Affect: Constricted   Thought Process  Thought Processes: Coherent  Descriptions of Associations:Loose  Orientation:Full (Time, Place and Person)  Thought Content:Logical; WDL  History of Schizophrenia/Schizoaffective disorder:Yes  Duration of Psychotic Symptoms:Less than six months  Hallucinations:Hallucinations: None  Ideas of Reference:None  Suicidal Thoughts:Suicidal Thoughts: No  Homicidal Thoughts:Homicidal Thoughts: No   Sensorium  Memory: Immediate Fair  Judgment: Intact  Insight: Present   Executive  Functions  Concentration: Fair  Attention Span: Fair  Recall: [employer] of Knowledge: Fair  Language: Fair   Psychomotor Activity  Psychomotor Activity: Psychomotor Activity: Normal   Assets  Assets: Resilience; Leisure Time; Social Support   Sleep  Sleep: Sleep: Good    Physical Exam: Physical Exam Vitals and nursing note reviewed.  Constitutional:      General: He is not in acute distress.    Appearance: Normal appearance. He is normal weight. He is not ill-appearing or toxic-appearing.  HENT:     Head: Normocephalic and atraumatic.  Pulmonary:     Effort: Pulmonary effort is normal.  Musculoskeletal:        General: Normal range of motion.  Neurological:     Mental Status: He is alert.    Review of Systems  Respiratory:  Negative for cough and shortness of breath.   Cardiovascular:  Negative for chest pain.  Gastrointestinal:  Negative for abdominal pain, constipation, diarrhea, nausea and vomiting.  Neurological:  Negative for dizziness, weakness and headaches.  Psychiatric/Behavioral:  Negative for depression, hallucinations and suicidal ideas. The patient is not nervous/anxious.    Blood pressure 104/74, pulse 76, temperature 98.4 F (36.9 C), temperature source Oral, resp. rate 18, SpO2 100%. There is no height or weight on file to calculate BMI.   Treatment Plan Summary: Daily contact with patient to assess and evaluate symptoms and progress in treatment and Medication management  [name] is a 42 yr old male who presented on 8/26 to [facility] with bizarre behaviors and hallucinations.  PPHx is significant for Schizophrenia, Tardive Dyskinesia, Hydrocephalus SP Shunt, and Multiple Psychiatric Hospitalizations (last- [name] 02/2022).    Ege has responded well to the Haldol.  He was able to sleep through the night and did not require further PRN's after noon yesterday.  Given his response we will not make any changes to his  medications at this time.  We will continue with the 1:1 monitoring for today.  He is not interested in an LAI so we will continue with oral Haldol.  We will continue to monitor.    Schizophrenia: -  Continue Haldol 10 mg BID this evening. -Continue Agitation Protocol: Haldol/Ativan/Benadryl -Continue 1:1 -Uphold IVC   -Continue PRN's: Tylenol, Maalox, Atarax, Milk of Magnesia, Trazodone    Lauro Franklin, MD 10/03/2022, 5:59 PM

## 2022-10-03 NOTE — Progress Notes (Signed)
Pt did not attend wrap-up group   

## 2022-10-03 NOTE — Progress Notes (Signed)
Recreation Therapy Notes  INPATIENT RECREATION THERAPY ASSESSMENT  Patient Details Name: [name] MRN: [id number] DOB: Jan 04, 1981 Today's Date: 10/03/2022       Information Obtained From: Patient (Some chart review as well)  Able to Participate in Assessment/Interview: Yes (Pt had moments were his answers were clear but then go back to mumbling and random topics.)  Patient Presentation: Anxious  Reason for Admission (Per Patient):  (UTA)  Patient Stressors:  (UTA)  Coping Skills:   Exercise (Running in place; Push ups)  Leisure Interests (2+):  Music - Listen, Individual - Other (Comment) (Learn things)  Frequency of Recreation/Participation:  (UTA)  Awareness of Community Resources:   Industrial/product designer)  Community Resources:     Current Use:    If no, Barriers?:    Expressed Interest in [employer] Information:  (UTA)  [place] of Residence:  per chart: [place]  Patient Main Form of Transportation:  Industrial/product designer)  Patient Strengths:  "Organizing"  Patient Identified Areas of Improvement:  "Keeping things with me"  Patient Goal for Hospitalization:  "getting along with people"  Current SI (including self-harm):   (UTA)  Current HI:   (UTA)  Current AVH:  (UTA)  Staff Intervention Plan: Group Attendance, Collaborate with Interdisciplinary Treatment Team  Consent to Intern Participation: N/A   Avannah Decker-McCall, LRT,CTRS Stacy Sailer A Neenah Canter-McCall 10/03/2022, 12:31 PM

## 2022-10-03 NOTE — Progress Notes (Signed)
Nursing 1:1 note   Patient calm and cooperative this shift Pacing up and down the hall Patient is safe  1:1 remains in place

## 2022-10-03 NOTE — Plan of Care (Signed)
  Problem: Coping: Goal: Ability to verbalize frustrations and anger appropriately will improve Outcome: Progressing Goal: Ability to demonstrate self-control will improve Outcome: Progressing   

## 2022-10-03 NOTE — Progress Notes (Signed)
Nursing 1:1 note  Patient sitting in dayroom with no distress. Patient is not talking incoherently today, he is easy to understanding and asking about the medications he is taking.   1:1 continues for safety.

## 2022-10-03 NOTE — Progress Notes (Signed)
Nursing 1:1 note D:Pt observed laying in bed with eyes closed. RR even and unlabored. No distress noted. A: 1:1 observation continues for safety  R: pt remains safe   

## 2022-10-03 NOTE — Group Note (Signed)
Occupational Therapy Group Note  Group Topic:Coping Skills  Group Date: 10/03/2022 Start Time: 1430 End Time: 1500 Facilitators: Ted Mcalpine, OT   Group Description: Group encouraged increased engagement and participation through discussion and activity focused on "Coping Ahead." Patients were split up into teams and selected a card from a stack of positive coping strategies. Patients were instructed to act out/charade the coping skill for other peers to guess and receive points for their team. Discussion followed with a focus on identifying additional positive coping strategies and patients shared how they were going to cope ahead over the weekend while continuing hospitalization stay.  Therapeutic Goal(s): Identify positive vs negative coping strategies. Identify coping skills to be used during hospitalization vs coping skills outside of hospital/at home Increase participation in therapeutic group environment and promote engagement in treatment   Participation Level: Did not attend                              Plan: Continue to engage patient in OT groups 2 - 3x/week.  10/03/2022  Ted Mcalpine, OT  Kerrin Champagne, OT

## 2022-10-03 NOTE — Progress Notes (Signed)
Nursing 1:1 note D:Pt observed lying in bed with eyes open. RR even and unlabored. No distress noted. A: 1:1 observation continues for safety  R: pt remains safe  

## 2022-10-03 NOTE — BHH Counselor (Signed)
CSW attempted to engage pt to obtain consent. Pt continues to be disorganized. CSW will continue to monitor.

## 2022-10-03 NOTE — Progress Notes (Signed)
Nursing 1:1 note D:Pt observed sitting on the bed with eyes open, pt talking incoherently and making bizarre gestures with his hands while he is talking. RR even and unlabored. No distress noted. A: 1:1 observation continues for safety  R: pt remains safe

## 2022-10-03 NOTE — Progress Notes (Signed)
Pt refused Trazodone this evening, pt stated he was not suicidal or homicidal , pt incoherent at times   10/03/22 2200  Psych Admission Type (Psych Patients Only)  Admission Status Involuntary  Psychosocial Assessment  Patient Complaints Suspiciousness;Restlessness  Eye Contact Avertive  Facial Expression Blank;Flat  Affect Preoccupied  Speech Tangential;Incoherent  Interaction Cautious;Evasive;Forwards little  Motor Activity Pacing  Appearance/Hygiene Body odor;Disheveled  Behavior Characteristics Resistant to care;Restless  Mood Suspicious  Aggressive Behavior  Effect No apparent injury  Thought Process  Coherency Disorganized;Loose associations;Tangential  Content Confabulation  Delusions Paranoid  Perception Derealization;Depersonalization  Hallucination Auditory  Judgment Impaired  Confusion Mild  Danger to Self  Current suicidal ideation? Denies

## 2022-10-04 DIAGNOSIS — F200 Paranoid schizophrenia: Secondary | ICD-10-CM | POA: Diagnosis not present

## 2022-10-04 MED ORDER — HALOPERIDOL 5 MG PO TABS
10.0000 mg | ORAL_TABLET | Freq: Two times a day (BID) | ORAL | Status: DC
  Administered 2022-10-04 – 2022-10-08 (×9): 10 mg via ORAL
  Filled 2022-10-04 (×12): qty 2

## 2022-10-04 MED ORDER — BENZTROPINE MESYLATE 1 MG PO TABS
1.0000 mg | ORAL_TABLET | Freq: Two times a day (BID) | ORAL | Status: DC
  Administered 2022-10-04 – 2022-10-08 (×8): 1 mg via ORAL
  Filled 2022-10-04 (×9): qty 1

## 2022-10-04 NOTE — Progress Notes (Signed)
Nursing 1:1 note D:Pt observed laying in bed with eyes open. RR even and unlabored. No distress noted. A: 1:1 observation continues for safety  R: pt remains safe

## 2022-10-04 NOTE — Progress Notes (Signed)
   10/04/22 0528  15 Minute Checks  Location Dayroom  Visual Appearance Calm  Behavior Composed  Sleep (Behavioral Health Patients Only)  Calculate sleep? (Click Yes once per 24 hr at 0600 safety check) Yes  Documented sleep last 24 hours 1.5

## 2022-10-04 NOTE — Progress Notes (Signed)
[name]  10/04/2022 11:42 AM [name]  MRN:  [id number] Subjective:   [name] is a 42 yr old male who presented on 8/26 to [facility] with bizarre behaviors and hallucinations.  PPHx is significant for Schizophrenia, Tardive Dyskinesia, Hydrocephalus SP Shunt, and Multiple Psychiatric Hospitalizations (last- [name] 02/2022).   The patient was seen and the case was discussed with multidisciplinary treatment team.  The patient's mother was reviewed.  He remained compliant with medications.  Last night he did not receive an agitation protocol but received his regular medications that included amantadine, as needed Haldol and trazodone.  Psychiatric Team made the following recommendations yesterday: -Start Haldol 10 mg BID this evening.  Although patient does have some abnormal involuntary hand movements, is quite psychotic and short-term use of Haldol is warranted. -Continue Agitation Protocol: Haldol/Ativan/Benadryl -Uphold IVC -remains on one-on-one.     On interview today patient reports he slept good and poor last night.  He reports his appetite is doing poor.  He reports no SI, HI, or AVH.  He reports no Paranoia or Ideas of Reference.  He reports no issues with his medications.  However this is in contradiction to what the staff reports.  He is actively hallucinating and labile and confused and disorganized.  He slept poorly.  He remains on one-on-one observation.  Patient was unable to coherently respond to questions today and remains labile.  We will continue to encourage him to take the long-acting injectable once he is stabilized.  Principal Problem: Paranoid schizophrenia (HCC) Diagnosis: Principal Problem:   Paranoid schizophrenia (HCC)  Total Time spent with patient:  I personally spent 35 minutes on the unit in direct patient care. The direct patient care time included face-to-face time with the patient, reviewing the patient's chart, communicating with  other professionals, and coordinating care. Greater than 50% of this time was spent in counseling or coordinating care with the patient regarding goals of hospitalization, psycho-education, and discharge planning needs.   Past Psychiatric History: Schizophrenia, Tardive Dyskinesia, Hydrocephalus SP Shunt, and Multiple Psychiatric Hospitalizations (last- [name] 02/2022).   Past Medical History:  Past Medical History:  Diagnosis Date   Schizophrenia [facility])     Past Surgical History:  Procedure Laterality Date   shunt surgery     Family History:  Family History  Problem Relation Age of Onset   Stroke Mother    Thyroid disease Father    Family Psychiatric  History:  Maternal Aunt: some unknown diagnosis No known Substance Abuse or Suicides   Social History:  Social History   Substance and Sexual Activity  Alcohol Use Not Currently     Social History   Substance and Sexual Activity  Drug Use Not Currently   Types: Marijuana    Social History   Socioeconomic History   Marital status: Single    Spouse name: Not on file   Number of children: Not on file   Years of education: Not on file   Highest education level: Not on file  Occupational History   Not on file  Tobacco Use   Smoking status: Every Day    Types: Cigars   Smokeless tobacco: Never  Vaping Use   Vaping status: Former  Substance and Sexual Activity   Alcohol use: Not Currently   Drug use: Not Currently    Types: Marijuana   Sexual activity: Not on file  Other Topics Concern   Not on file  Social History Narrative  Not on file   Social Determinants of Health   Financial Resource Strain: Patient Declined (02/10/2022)   Received from [facility], [facility]   Overall Financial Resource Strain (CARDIA)    Difficulty of Paying Living Expenses: Patient declined  Food Insecurity: Food Insecurity Present (02/11/2022)   Received from [place], [facility]   Hunger Vital Sign     Worried About Running Out of Food in the Last Year: Sometimes true    Ran Out of Food in the Last Year: Sometimes true  Transportation Needs: Patient Declined (02/10/2022)   Received from [facility], [facility]   [facility] - Transportation    Lack of Transportation (Medical): Patient declined    Lack of Transportation (Non-Medical): Patient declined  Physical Activity: Inactive (02/10/2022)   Received from [facility], [facility]   Exercise Vital Sign    Days of Exercise per Week: 0 days    Minutes of Exercise per Session: 0 min  Stress: Patient Declined (02/10/2022)   Received from [facility], [facility] of Occupational Health - Occupational Stress Questionnaire    Feeling of Stress : Patient declined  Social Connections: Patient Declined (02/10/2022)   Received from [facility], [facility]   Social Connection and Isolation Panel [NHANES]    Frequency of Communication with Friends and Family: Patient declined    Frequency of Social Gatherings with Friends and Family: Patient declined    Attends Religious Services: Patient declined    Database administrator or Organizations: Patient declined    Attends Engineer, structural: Patient declined    Marital Status: Patient declined   Additional Social History:                         Sleep: Good  Appetite:  Good  Current Medications: Current Facility-Administered Medications  Medication Dose Route Frequency Provider Last Rate Last Admin   acetaminophen (TYLENOL) tablet 650 mg  650 mg Oral Q6H PRN Onuoha, Josephine C, NP       alum & mag hydroxide-simeth (MAALOX/MYLANTA) 200-200-20 MG/5ML suspension 30 mL  30 mL Oral Q4H PRN Dahlia Byes C, NP       amantadine (SYMMETREL) capsule 100 mg  100 mg Oral BID Welford Roche, Josephine C, NP   100 mg at 10/04/22 0830   diphenhydrAMINE (BENADRYL) capsule 25 mg  25 mg Oral Q8H PRN Dahlia Byes C, NP       Or    diphenhydrAMINE (BENADRYL) injection 25 mg  25 mg Intramuscular Q8H PRN Onuoha, Josephine C, NP   25 mg at 10/02/22 1259   haloperidol (HALDOL) tablet 10 mg  10 mg Oral BID Rex Kras, MD   10 mg at 10/04/22 1030   haloperidol (HALDOL) tablet 5 mg  5 mg Oral Q8H PRN Dahlia Byes C, NP       Or   haloperidol lactate (HALDOL) injection 5 mg  5 mg Intramuscular Q8H PRN Onuoha, Josephine C, NP   5 mg at 10/02/22 1300   LORazepam (ATIVAN) tablet 2 mg  2 mg Oral Q6H PRN Lauro Franklin, MD       Or   LORazepam (ATIVAN) injection 2 mg  2 mg Intramuscular Q6H PRN Lauro Franklin, MD   2 mg at 10/02/22 1301   magnesium hydroxide (MILK OF MAGNESIA) suspension 30 mL  30 mL Oral Daily PRN Onuoha,  Hessie Dibble, NP       traZODone (DESYREL) tablet 100 mg  100 mg Oral QHS Onuoha, Josephine C, NP   100 mg at 10/02/22 2007    Lab Results:  No results found for this or any previous visit (from the past 48 hour(s)).   Blood Alcohol level:  Lab Results  Component Value Date   ETH <10 10/01/2022   ETH <10 09/03/2022    Metabolic Disorder Labs: Lab Results  Component Value Date   HGBA1C 5.6 02/09/2022   MPG 114 02/09/2022   MPG 108.28 08/06/2021   Lab Results  Component Value Date   PROLACTIN 10.2 02/09/2022   Lab Results  Component Value Date   CHOL 134 02/09/2022   TRIG 45 02/09/2022   HDL 57 02/09/2022   CHOLHDL 2.4 02/09/2022   VLDL 9 02/09/2022   LDLCALC 68 02/09/2022   LDLCALC 73 08/06/2021    Physical Findings: AIMS:  , ,  ,  ,    CIWA:    COWS:     Musculoskeletal: Strength & Muscle Tone: within normal limits Gait & Station: normal Patient leans: N/A  Psychiatric Specialty Exam:  Presentation  General Appearance:  Bizarre; Disheveled  Eye Contact: Fair  Speech: Garbled  Speech Volume: Increased  Handedness: Right   Mood and Affect  Mood: Anxious; Irritable  Affect: Labile   Thought Process  Thought  Processes: Disorganized  Descriptions of Associations:Loose  Orientation:Full (Time, Place and Person)  Thought Content:Illogical; Perseveration; Rumination  History of Schizophrenia/Schizoaffective disorder:Yes  Duration of Psychotic Symptoms:Greater than six months  Hallucinations:Hallucinations: Auditory  Ideas of Reference:Paranoia; Delusions  Suicidal Thoughts:Suicidal Thoughts: No  Homicidal Thoughts:Homicidal Thoughts: No   Sensorium  Memory: Immediate Fair; Recent Poor; Remote Poor  Judgment: Poor  Insight: Poor   Executive Functions  Concentration: Poor  Attention Span: Fair  Recall: Poor  Fund of Knowledge: Fair  Language: Fair   Psychomotor Activity  Psychomotor Activity: Psychomotor Activity: Mannerisms   Assets  Assets: Desire for Improvement; Resilience   Sleep  Sleep: Sleep: Poor    Physical Exam: Physical Exam Vitals and nursing Solis reviewed.  Constitutional:      General: He is not in acute distress.    Appearance: Normal appearance. He is normal weight. He is not ill-appearing or toxic-appearing.  HENT:     Head: Normocephalic and atraumatic.  Pulmonary:     Effort: Pulmonary effort is normal.  Musculoskeletal:        General: Normal range of motion.  Neurological:     Mental Status: He is alert.    Review of Systems  Respiratory:  Negative for cough and shortness of breath.   Cardiovascular:  Negative for chest pain.  Gastrointestinal:  Negative for abdominal pain, constipation, diarrhea, nausea and vomiting.  Neurological:  Negative for dizziness, weakness and headaches.  Psychiatric/Behavioral:  Negative for depression, hallucinations and suicidal ideas. The patient is not nervous/anxious.    Blood pressure 112/80, pulse 93, temperature 98.4 F (36.9 C), temperature source Oral, resp. rate 18, SpO2 100%. There is no height or weight on file to calculate BMI.   Treatment Plan Summary: Daily contact with  patient to assess and evaluate symptoms and progress in treatment and Medication management  Tone Jepperson. Lafountaine is a 42 yr old male who presented on 8/26 to [facility] with bizarre behaviors and hallucinations.  PPHx is significant for Schizophrenia, Tardive Dyskinesia, Hydrocephalus SP Shunt, and Multiple Psychiatric Hospitalizations (last- [name] 02/2022).    Lexus has  responded well to the Haldol.  He was able to sleep through the night and did not require further PRN's after noon yesterday.  He is currently on Haldol 10 mg p.o. twice daily.  We will continue to increase long-acting injectable.  He is also likely that the patient may have been on a long-acting injectable in the past and will continue to explore this to see if he had received any in the last few months.  Patient remains on amantadine 100 mg twice a day which is likely being given for his abnormal involuntary movements and is unclear if this is related to medications or due to Parkinson's disease.    Schizophrenia: -Continue Haldol 10 mg BID this evening. -Continue Agitation Protocol: Haldol/Ativan/Benadryl -Continue 1:1 -Uphold IVC   -Continue PRN's: Tylenol, Maalox, Atarax, Milk of Magnesia, Trazodone    Rex Kras, MD 10/04/2022, 11:42 AM Patient ID: [name], male   DOB: 1980-02-26, 14 y.o.   MRN: [id number]

## 2022-10-04 NOTE — Progress Notes (Signed)
Pt awake and asking for something for sleep. This Clinical research associate asks pt if he wants the PRN medication prescribed for him and he says no. Pt goes back and forth with saying "yes" and then saying "no." Pt is fidgety and begins rambling and mumbling his words. Pt asks for something to eat. Pt goes back to his room with his sitter. Will continue to monitor.

## 2022-10-04 NOTE — Progress Notes (Signed)
Nursing 1:1 note D:Pt observed pacing in his room. RR even and unlabored. No distress noted. A: 1:1 observation continues for safety  R: pt remains safe

## 2022-10-04 NOTE — Progress Notes (Signed)
Pt visible in the dayroom some this evening, pt took Trazodone without issue this evening, pt continues to have incoherent speech, but pt appears to be able to communicate his needs better    10/04/22 2115  Psych Admission Type (Psych Patients Only)  Admission Status Involuntary  Psychosocial Assessment  Patient Complaints Suspiciousness  Eye Contact Avertive  Facial Expression Blank;Flat  Affect Preoccupied  Speech Tangential;Incoherent  Interaction Cautious;Evasive;Forwards little  Motor Activity Pacing  Appearance/Hygiene Body odor;Disheveled  Behavior Characteristics Restless;Guarded  Mood Suspicious  Aggressive Behavior  Effect No apparent injury  Thought Process  Coherency Disorganized;Loose associations;Tangential  Content Confabulation  Delusions Paranoid  Perception Derealization;Depersonalization  Hallucination Auditory  Judgment Impaired  Confusion Mild  Danger to Self  Current suicidal ideation? Denies

## 2022-10-04 NOTE — Plan of Care (Signed)
  Problem: Activity: Goal: Interest or engagement in activities will improve Outcome: Progressing Goal: Sleeping patterns will improve Outcome: Progressing   Problem: Coping: Goal: Ability to demonstrate self-control will improve Outcome: Progressing   Problem: Education: Goal: Emotional status will improve Outcome: Not Progressing Goal: Mental status will improve Outcome: Not Progressing

## 2022-10-04 NOTE — Progress Notes (Signed)
Pt did not attend wrap-up group   

## 2022-10-04 NOTE — Group Note (Signed)
Recreation Therapy Group Note   Group Topic:Team Building  Group Date: 10/04/2022 Start Time: 1000 End Time: 1026 Facilitators: Jozalyn Baglio-McCall, LRT,CTRS Location: [address] Dayroom   Goal Area(s) Addresses:  Patient will effectively work with peer towards shared goal.  Patient will identify skills used to make activity successful.  Patient will identify how skills used during activity can be applied to reach post d/c goals.   Group Description: [employer]. In teams of 5-6, patients were given 11 craft pipe cleaners. Using the materials provided, patients were instructed to compete again the opposing team(s) to build the tallest free-standing structure from floor level. The activity was timed; difficulty increased by Clinical research associate as Production designer, theatre/television/film continued.  Systematically resources were removed with additional directions for example, placing one arm behind their back, working in silence, and shape stipulations. LRT facilitated post-activity discussion reviewing team processes and necessary communication skills involved in completion. Patients were encouraged to reflect how the skills utilized, or not utilized, in this activity can be incorporated to positively impact support systems post discharge.   Affect/Mood: Drowsy   Participation Level: None   Participation Quality: None   Behavior: Calm   Speech/Thought Process: Disorganized   Insight: None   Judgement: None   Modes of Intervention: STEM Activity   Patient Response to Interventions:  Disengaged   Education Outcome:  In group clarification offered    Clinical Observations/Individualized Feedback: Pt was quiet. When greeted by LRT pt speech was still disorganized. Pt was calm but eventually fell asleep.    Plan: Continue to engage patient in RT group sessions 2-3x/week.   Saory Carriero-McCall, LRT,CTRS 10/04/2022 10:59 AM

## 2022-10-04 NOTE — Progress Notes (Signed)
Pt been up most of the night, pt refused his Trazodone after much prompting from multiple staff

## 2022-10-04 NOTE — Plan of Care (Signed)
  Problem: Education: Goal: Emotional status will improve Outcome: Progressing   Problem: Activity: Goal: Interest or engagement in activities will improve Outcome: Progressing   Problem: Coping: Goal: Ability to demonstrate self-control will improve Outcome: Progressing   Problem: Health Behavior/Discharge Planning: Goal: Compliance with treatment plan for underlying cause of condition will improve Outcome: Progressing   Problem: Physical Regulation: Goal: Ability to maintain clinical measurements within normal limits will improve Outcome: Progressing   Problem: Safety: Goal: Periods of time without injury will increase Outcome: Progressing

## 2022-10-04 NOTE — Progress Notes (Signed)
Nursing 1:1 note D:Pt observed sleeping in bed with eyes closed. RR even and unlabored. No distress noted. A: 1:1 observation continues for safety  R: pt remains safe  

## 2022-10-04 NOTE — [facility] Group Notes (Signed)
Spirituality group facilitated by Kathleen Argue, BCC.  Group Description: Group focused on topic of hope. Patients participated in facilitated discussion around topic, connecting with one another around experiences and definitions for hope. Group members engaged with visual explorer photos, reflecting on what hope looks like for them today. Group engaged in discussion around how their definitions of hope are present today in hospital.  Modalities: Psycho-social ed, Adlerian, Narrative, MI  Patient Progress: [name] attended group and participated and engaged in group conversation.  At times, it was difficult to hear and follow what he was saying.

## 2022-10-04 NOTE — Progress Notes (Signed)
1:1 Note: Patient pacing the hallway mumbling to himself.  Medication given as prescribed.  Patient visible in milieu watching TV.  Routine safety checks maintained.  Support and encouragement offered as needed.  Patient is safe on the unit with supervision.

## 2022-10-04 NOTE — Progress Notes (Signed)
1:1 Note: Patient maintained on constant supervision for safety.  Patient observed responding to internal stimuli.  Medications given as prescribed.  Routine safety checks continues.  Patient is safe on the unit with supervision.

## 2022-10-05 DIAGNOSIS — F200 Paranoid schizophrenia: Secondary | ICD-10-CM | POA: Diagnosis not present

## 2022-10-05 NOTE — Group Note (Signed)
Date:  10/05/2022 Time:  10:46 AM  Group Topic/Focus:  Goals Group:   The focus of this group is to help patients establish daily goals to achieve during treatment and discuss how the patient can incorporate goal setting into their daily lives to aide in recovery. Orientation:   The focus of this group is to educate the patient on the purpose and policies of crisis stabilization and provide a format to answer questions about their admission.  The group details unit policies and expectations of patients while admitted.    Participation Level:  Active  Participation Quality:  Appropriate  Affect:  Appropriate  Cognitive:  Appropriate  Insight: Appropriate  Engagement in Group:  Engaged  Modes of Intervention:  Discussion  Additional Comments:  Patient attended morning orientation/goal setting group and participated.    Reema Chick W Kiyani Jernigan 10/05/2022, 10:46 AM

## 2022-10-05 NOTE — Progress Notes (Signed)
Nursing 1:1 note D:Pt observed sleeping in bed with eyes closed. RR even and unlabored. No distress noted. A: 1:1 observation continues for safety  R: pt remains safe  

## 2022-10-05 NOTE — Group Note (Signed)
Date:  10/05/2022 Time:  8:41 PM  Group Topic/Focus:  Wrap-Up Group:   The focus of this group is to help patients review their daily goal of treatment and discuss progress on daily workbooks.    Participation Level:  Active  Participation Quality:  Appropriate  Affect:  Appropriate  Cognitive:  Appropriate  Insight: Appropriate  Engagement in Group:  Developing/Improving  Modes of Intervention:  Discussion  Additional Comments:  Pt stated his goal for today was to focus on his treatment plan. Pt stated he accomplished his goal today. Pt stated he talked with his doctor and social worker about his care today. Pt rated his overall day a 10. Pt stated the plan was for him to discharge 10/08/2022. Pt stated he was able to get off the 1:1 today. Pt stated he made no calls today. Pt stated he felt better about himself today. Pt stated he was able to attend lunch and dinner today. Pt stated he took all medications provided today. Pt stated he attend all groups held today. Pt stated his appetite was pretty good today. Pt rated sleep last night was pretty good. Pt stated the goal tonight was to get some rest. Pt stated he had no physical pain today. Pt deny visual hallucinations and auditory issues tonight. Pt denies thoughts of harming himself or others. Pt stated he would alert staff if anything changed  Felipa Furnace 10/05/2022, 8:41 PM

## 2022-10-05 NOTE — Progress Notes (Signed)
[facility] Post 1:1 Observation Documentation  For the first (8) hours following discontinuation of 1:1 precautions, a progress note entry by nursing staff should be documented at least every 2 hours, reflecting the patient's behavior, condition, mood, and conversation.  Use the progress notes for additional entries.  Time 1:1 discontinued:  09:34 am  Patient's Behavior:  Patient is calm and resting in his room  Patient's Condition:  patient is calm and resting in his room  Patient's Conversation:  No interaction with staff  Audrie Lia Alizah Sills 10/05/2022, 1:41 PM

## 2022-10-05 NOTE — Progress Notes (Signed)
[facility] Post 1:1 Observation Documentation  For the first (8) hours following discontinuation of 1:1 precautions, a progress note entry by nursing staff should be documented at least every 2 hours, reflecting the patient's behavior, condition, mood, and conversation.  Use the progress notes for additional entries.  Time 1:1 discontinued:  09:34 am  Patient's Behavior:  Patient is alert and oriented to person  Patient's Condition:  Patient is calm and appropriate in the dayroom.  Patient's Conversation:  Minimal interaction with staff.  [name] 10/05/2022, 1:38 PM

## 2022-10-05 NOTE — Progress Notes (Signed)
   10/05/22 2208  Psych Admission Type (Psych Patients Only)  Admission Status Involuntary  Psychosocial Assessment  Patient Complaints Suspiciousness  Eye Contact Brief  Facial Expression Flat  Affect Preoccupied  Speech Tangential  Interaction Guarded;Forwards little  Motor Activity Other (Comment) (WDL)  Appearance/Hygiene Disheveled  Behavior Characteristics Guarded  Mood Suspicious;Preoccupied  Thought Process  Coherency Disorganized  Content Confabulation  Delusions Paranoid  Perception Derealization  Hallucination Auditory  Judgment Impaired  Confusion Mild  Danger to Self  Current suicidal ideation? Denies  Danger to Others  Danger to Others None reported or observed

## 2022-10-05 NOTE — Progress Notes (Signed)
[facility] Post 1:1 Observation Documentation  For the first (8) hours following discontinuation of 1:1 precautions, a progress note entry by nursing staff should be documented at least every 2 hours, reflecting the patient's behavior, condition, mood, and conversation.  Use the progress notes for additional entries.  Time 1:1 discontinued:  09:34 am  Patient's Behavior:  Patient is alert and oriented to person  Patient's Condition:  Patient is calm and cooperative  Patient's Conversation:  Minimal interaction with staff.  Audrie Lia Eon Zunker 10/05/2022, 1:40 PM

## 2022-10-05 NOTE — Plan of Care (Signed)

## 2022-10-05 NOTE — Progress Notes (Signed)
[facility] Post 1:1 Observation Documentation  For the first (8) hours following discontinuation of 1:1 precautions, a progress note entry by nursing staff should be documented at least every 2 hours, reflecting the patient's behavior, condition, mood, and conversation.  Use the progress notes for additional entries.  Time 1:1 discontinued:  09:36  Patient's Behavior:  Patient is calm and cooperative with care  Patient's Condition:  Patient in his room resting  Patient's Conversation:  No interaction.  [name] 10/05/2022, 6:46 PM

## 2022-10-05 NOTE — Progress Notes (Signed)
[facility] MD Progress Note  10/05/2022 12:16 PM [name]  MRN:  [id number] Subjective:   [name] is a 42 yr old male who presented on 8/26 to [facility] with bizarre behaviors and hallucinations.  PPHx is significant for Schizophrenia, Tardive Dyskinesia, Hydrocephalus SP Shunt, and Multiple Psychiatric Hospitalizations (last- [name] 02/2022).    Case was discussed in the multidisciplinary team. MAR was reviewed and patient was compliant with medications.  He received PRN Agitation Haldol, Benadryl, and Ativan around noon yesterday.   Psychiatric Team made the following recommendations yesterday: -Continue Haldol 10 mg BID for psychosis -Continue Agitation Protocol: Haldol/Ativan/Benadryl -Uphold IVC -Continue 1:1    On interview today patient reports he slept good last night.  He reports his appetite is doing good.  He reports no SI, HI, or AVH (observed on unit responding to internal stimuli.  He reports no Paranoia or Ideas of Reference.  He reports no issues with his medications.  Discussed if he had reconsidered getting an LAI.  He reports that he does not want to receive an LAI and wants to continue taking the oral pills.  He reports his dad has not brought him any clothes.  Encouraged him to call his father and discussed attempting to call as well.  He reports no other concerns at present.   Attempted to call patient's father, Silberio Visitacion, [phone].  However, there was no answer.  Principal Problem: Paranoid schizophrenia (HCC) Diagnosis: Principal Problem:   Paranoid schizophrenia (HCC)  Total Time spent with patient:  I personally spent 35 minutes on the unit in direct patient care. The direct patient care time included face-to-face time with the patient, reviewing the patient's chart, communicating with other professionals, and coordinating care. Greater than 50% of this time was spent in counseling or coordinating care with the patient regarding goals of  hospitalization, psycho-education, and discharge planning needs.   Past Psychiatric History: Schizophrenia, Tardive Dyskinesia, Hydrocephalus SP Shunt, and Multiple Psychiatric Hospitalizations (last- [name] 02/2022).   Past Medical History:  Past Medical History:  Diagnosis Date   Schizophrenia [facility]     Past Surgical History:  Procedure Laterality Date   shunt surgery     Family History:  Family History  Problem Relation Age of Onset   Stroke Mother    Thyroid disease Father    Family Psychiatric  History:  Maternal Aunt: some unknown diagnosis No known Substance Abuse or Suicides   Social History:  Social History   Substance and Sexual Activity  Alcohol Use Not Currently     Social History   Substance and Sexual Activity  Drug Use Not Currently   Types: Marijuana    Social History   Socioeconomic History   Marital status: Single    Spouse name: Not on file   Number of children: Not on file   Years of education: Not on file   Highest education level: Not on file  Occupational History   Not on file  Tobacco Use   Smoking status: Every Day    Types: Cigars   Smokeless tobacco: Never  Vaping Use   Vaping status: Former  Substance and Sexual Activity   Alcohol use: Not Currently   Drug use: Not Currently    Types: Marijuana   Sexual activity: Not on file  Other Topics Concern   Not on file  Social History Narrative   Not on file   Social Determinants of Health   Financial Resource Strain: Patient Declined (02/10/2022)  Received from [facility], [facility]   Overall Financial Resource Strain (CARDIA)    Difficulty of Paying Living Expenses: Patient declined  Food Insecurity: Food Insecurity Present (02/11/2022)   Received from [facility], [facility]   Hunger Vital Sign    Worried About Running Out of Food in the Last Year: Sometimes true    Ran Out of Food in the Last Year: Sometimes true  Transportation Needs: Patient  Declined (02/10/2022)   Received from [facility], [facility]   [facility] - Transportation    Lack of Transportation (Medical): Patient declined    Lack of Transportation (Non-Medical): Patient declined  Physical Activity: Inactive (02/10/2022)   Received from [facility], [facility]   Exercise Vital Sign    Days of Exercise per Week: 0 days    Minutes of Exercise per Session: 0 min  Stress: Patient Declined (02/10/2022)   Received from [facility], [facility] of Occupational Health - Occupational Stress Questionnaire    Feeling of Stress : Patient declined  Social Connections: Patient Declined (02/10/2022)   Received from [facility], [facility]   Social Connection and Isolation Panel [NHANES]    Frequency of Communication with Friends and Family: Patient declined    Frequency of Social Gatherings with Friends and Family: Patient declined    Attends Religious Services: Patient declined    Database administrator or Organizations: Patient declined    Attends Engineer, structural: Patient declined    Marital Status: Patient declined   Additional Social History:                         Sleep: Good  Appetite:  Good  Current Medications: Current Facility-Administered Medications  Medication Dose Route Frequency Provider Last Rate Last Admin   acetaminophen (TYLENOL) tablet 650 mg  650 mg Oral Q6H PRN Onuoha, Josephine C, NP       alum & mag hydroxide-simeth (MAALOX/MYLANTA) 200-200-20 MG/5ML suspension 30 mL  30 mL Oral Q4H PRN Welford Roche, Josephine C, NP       amantadine (SYMMETREL) capsule 100 mg  100 mg Oral BID Onuoha, Josephine C, NP   100 mg at 10/05/22 0843   benztropine (COGENTIN) tablet 1 mg  1 mg Oral BID Rex Kras, MD   1 mg at 10/05/22 0843   diphenhydrAMINE (BENADRYL) capsule 25 mg  25 mg Oral Q8H PRN Dahlia Byes C, NP       Or   diphenhydrAMINE (BENADRYL) injection 25 mg  25 mg Intramuscular Q8H  PRN Onuoha, Josephine C, NP   25 mg at 10/02/22 1259   haloperidol (HALDOL) tablet 10 mg  10 mg Oral BID Rex Kras, MD   10 mg at 10/05/22 0843   haloperidol (HALDOL) tablet 5 mg  5 mg Oral Q8H PRN Dahlia Byes C, NP       Or   haloperidol lactate (HALDOL) injection 5 mg  5 mg Intramuscular Q8H PRN Onuoha, Josephine C, NP   5 mg at 10/02/22 1300   LORazepam (ATIVAN) tablet 2 mg  2 mg Oral Q6H PRN Lauro Franklin, MD       Or   LORazepam (ATIVAN) injection 2 mg  2 mg Intramuscular Q6H PRN Lauro Franklin, MD   2 mg at 10/02/22 1301   magnesium hydroxide (MILK OF MAGNESIA) suspension 30 mL  30 mL Oral  Daily PRN Earney Navy, NP       traZODone (DESYREL) tablet 100 mg  100 mg Oral QHS Dahlia Byes C, NP   100 mg at 10/04/22 2051    Lab Results:  No results found for this or any previous visit (from the past 48 hour(s)).   Blood Alcohol level:  Lab Results  Component Value Date   ETH <10 10/01/2022   ETH <10 09/03/2022    Metabolic Disorder Labs: Lab Results  Component Value Date   HGBA1C 5.6 02/09/2022   MPG 114 02/09/2022   MPG 108.28 08/06/2021   Lab Results  Component Value Date   PROLACTIN 10.2 02/09/2022   Lab Results  Component Value Date   CHOL 134 02/09/2022   TRIG 45 02/09/2022   HDL 57 02/09/2022   CHOLHDL 2.4 02/09/2022   VLDL 9 02/09/2022   LDLCALC 68 02/09/2022   LDLCALC 73 08/06/2021    Physical Findings: AIMS:  , ,  ,  ,    CIWA:    COWS:     Musculoskeletal: Strength & Muscle Tone: within normal limits Gait & Station: normal Patient leans: N/A  Psychiatric Specialty Exam:  Presentation  General Appearance:  Casual  Eye Contact: Fair  Speech: -- (mix of clear and coherent and garbled)  Speech Volume: Normal  Handedness: Right   Mood and Affect  Mood: -- ("ok")  Affect: Congruent   Thought Process  Thought Processes: Linear  Descriptions of Associations:Loose  Orientation:Full (Time,  Place and Person)  Thought Content:Perseveration; Rumination  History of Schizophrenia/Schizoaffective disorder:Yes  Duration of Psychotic Symptoms:Greater than six months  Hallucinations:Hallucinations: Auditory Description of Auditory Hallucinations: Seen resopnding to internal stimuli  Ideas of Reference:Delusions  Suicidal Thoughts:Suicidal Thoughts: No  Homicidal Thoughts:Homicidal Thoughts: No   Sensorium  Memory: Immediate Fair  Judgment: Poor  Insight: Poor   Executive Functions  Concentration: Fair  Attention Span: Fair  Recall: Poor  Fund of Knowledge: Fair  Language: Fair   Psychomotor Activity  Psychomotor Activity: Psychomotor Activity: Mannerisms   Assets  Assets: Desire for Improvement; Resilience   Sleep  Sleep: Sleep: Good Number of Hours of Sleep: 8.25    Physical Exam: Physical Exam Vitals and nursing note reviewed.  Constitutional:      General: He is not in acute distress.    Appearance: Normal appearance. He is normal weight. He is not ill-appearing or toxic-appearing.  HENT:     Head: Normocephalic and atraumatic.  Pulmonary:     Effort: Pulmonary effort is normal.  Musculoskeletal:        General: Normal range of motion.  Neurological:     Mental Status: He is alert.    Review of Systems  Respiratory:  Negative for cough and shortness of breath.   Cardiovascular:  Negative for chest pain.  Gastrointestinal:  Negative for abdominal pain, constipation, diarrhea, nausea and vomiting.  Neurological:  Negative for dizziness, weakness and headaches.  Psychiatric/Behavioral:  Negative for depression, hallucinations (reports none but witnessed responding to internal stimuli) and suicidal ideas. The patient is not nervous/anxious.    Blood pressure 109/72, pulse 95, temperature 98 F (36.7 C), temperature source Oral, resp. rate 18, SpO2 98%. There is no height or weight on file to calculate BMI.   Treatment Plan  Summary: Daily contact with patient to assess and evaluate symptoms and progress in treatment and Medication management  Estefano Crombie. Safier is a 42 yr old male who presented on 8/26 to [facility] with bizarre behaviors  and hallucinations.  PPHx is significant for Schizophrenia, Tardive Dyskinesia, Hydrocephalus SP Shunt, and Multiple Psychiatric Hospitalizations (last- [name] 02/2022).    Dontae has continued to clear with restarting the Haldol.  Again discussed LAI but he continues to decline it.  Attempted to contact his father but was unable to reach him.  We will not make any changes to his medications at this time.  As he is not intrusive any more we will trial him off the 1:1.  We will continue to monitor.     Schizophrenia: -Continue Haldol 10 mg BID for psychosis -Continue Agitation Protocol: Haldol/Ativan/Benadryl -Stop 1:1 -Uphold IVC   -Continue PRN's: Tylenol, Maalox, Atarax, Milk of Magnesia, Trazodone    Lauro Franklin, MD 10/05/2022, 12:16 PM

## 2022-10-06 DIAGNOSIS — F200 Paranoid schizophrenia: Secondary | ICD-10-CM

## 2022-10-06 NOTE — Plan of Care (Signed)
  Problem: Activity: Goal: Interest or engagement in activities will improve Outcome: Progressing   Problem: Coping: Goal: Ability to demonstrate self-control will improve Outcome: Progressing   Problem: Safety: Goal: Periods of time without injury will increase Outcome: Progressing   Problem: Education: Goal: Knowledge of [place] [facility] Education information/materials will improve Outcome: Not Progressing Goal: Emotional status will improve Outcome: Not Progressing Goal: Mental status will improve Outcome: Not Progressing

## 2022-10-06 NOTE — Plan of Care (Signed)
  Problem: Education: Goal: Mental status will improve Outcome: Progressing   Problem: Activity: Goal: Interest or engagement in activities will improve Outcome: Progressing   

## 2022-10-06 NOTE — Progress Notes (Signed)
[facility] MD Progress Note  10/06/2022 11:12 AM [name]  MRN:  [id number] Subjective:   [name] is a 42 yr old male who presented on 8/26 to [facility] with bizarre behaviors and hallucinations.  PPHx is significant for Schizophrenia, Tardive Dyskinesia, Hydrocephalus SP Shunt, and Multiple Psychiatric Hospitalizations (last- [name] 02/2022).    Case was discussed in the multidisciplinary team. MAR was reviewed and patient was compliant with medications.  He received PRN Agitation Haldol, Benadryl, and Ativan around noon yesterday.   Psychiatric Team made the following recommendations yesterday: -Continue Haldol 10 mg BID for psychosis -Continue Agitation Protocol: Haldol/Ativan/Benadryl -Uphold IVC -Stopped 1:1   On interview today patient reports he slept good last night.  He reports his appetite is doing good.  He reports no SI, HI, or AVH.  He reports no Paranoia or Ideas of Reference.  He reports no issues with his medications.  Discussed with him that we attempted to call his father but was unable to contact him.  Discussed we would attempt again today and he was agreeable.  He reports no other concerns at present.    Attempted to call patient's father x2, [name], [phone].  However, there was no answer either time.   Principal Problem: Paranoid schizophrenia (HCC) Diagnosis: Principal Problem:   Paranoid schizophrenia (HCC)  Total Time spent with patient:  I personally spent 35 minutes on the unit in direct patient care. The direct patient care time included face-to-face time with the patient, reviewing the patient's chart, communicating with other professionals, and coordinating care. Greater than 50% of this time was spent in counseling or coordinating care with the patient regarding goals of hospitalization, psycho-education, and discharge planning needs.   Past Psychiatric History: Schizophrenia, Tardive Dyskinesia, Hydrocephalus SP Shunt, and Multiple  Psychiatric Hospitalizations (last- [name] 02/2022).   Past Medical History:  Past Medical History:  Diagnosis Date   Schizophrenia [facility])     Past Surgical History:  Procedure Laterality Date   shunt surgery     Family History:  Family History  Problem Relation Age of Onset   Stroke Mother    Thyroid disease Father    Family Psychiatric  History:  Maternal Aunt: some unknown diagnosis No known Substance Abuse or Suicides   Social History:  Social History   Substance and Sexual Activity  Alcohol Use Not Currently     Social History   Substance and Sexual Activity  Drug Use Not Currently   Types: Marijuana    Social History   Socioeconomic History   Marital status: Single    Spouse name: Not on file   Number of children: Not on file   Years of education: Not on file   Highest education level: Not on file  Occupational History   Not on file  Tobacco Use   Smoking status: Every Day    Types: Cigars   Smokeless tobacco: Never  Vaping Use   Vaping status: Former  Substance and Sexual Activity   Alcohol use: Not Currently   Drug use: Not Currently    Types: Marijuana   Sexual activity: Not on file  Other Topics Concern   Not on file  Social History Narrative   Not on file   Social Determinants of Health   Financial Resource Strain: Patient Declined (02/10/2022)   Received from [facility], [facility]   Overall Financial Resource Strain (CARDIA)    Difficulty of Paying Living Expenses: Patient declined  Food Insecurity:  Food Insecurity Present (02/11/2022)   Received from [facility], [facility]   Hunger Vital Sign    Worried About Running Out of Food in the Last Year: Sometimes true    Ran Out of Food in the Last Year: Sometimes true  Transportation Needs: Patient Declined (02/10/2022)   Received from [facility], [facility]   [facility] - Transportation    Lack of Transportation (Medical): Patient declined    Lack of  Transportation (Non-Medical): Patient declined  Physical Activity: Inactive (02/10/2022)   Received from [facility], [facility]   Exercise Vital Sign    Days of Exercise per Week: 0 days    Minutes of Exercise per Session: 0 min  Stress: Patient Declined (02/10/2022)   Received from [facility], [facility] of Occupational Health - Occupational Stress Questionnaire    Feeling of Stress : Patient declined  Social Connections: Patient Declined (02/10/2022)   Received from [facility], [facility]   Social Connection and Isolation Panel [NHANES]    Frequency of Communication with Friends and Family: Patient declined    Frequency of Social Gatherings with Friends and Family: Patient declined    Attends Religious Services: Patient declined    Database administrator or Organizations: Patient declined    Attends Engineer, structural: Patient declined    Marital Status: Patient declined   Additional Social History:                         Sleep: Good  Appetite:  Good  Current Medications: Current Facility-Administered Medications  Medication Dose Route Frequency Provider Last Rate Last Admin   acetaminophen (TYLENOL) tablet 650 mg  650 mg Oral Q6H PRN Onuoha, Josephine C, NP       alum & mag hydroxide-simeth (MAALOX/MYLANTA) 200-200-20 MG/5ML suspension 30 mL  30 mL Oral Q4H PRN Welford Roche, Josephine C, NP       amantadine (SYMMETREL) capsule 100 mg  100 mg Oral BID Onuoha, Josephine C, NP   100 mg at 10/06/22 0819   benztropine (COGENTIN) tablet 1 mg  1 mg Oral BID Rex Kras, MD   1 mg at 10/06/22 0819   diphenhydrAMINE (BENADRYL) capsule 25 mg  25 mg Oral Q8H PRN Dahlia Byes C, NP       Or   diphenhydrAMINE (BENADRYL) injection 25 mg  25 mg Intramuscular Q8H PRN Onuoha, Josephine C, NP   25 mg at 10/02/22 1259   haloperidol (HALDOL) tablet 10 mg  10 mg Oral BID Rex Kras, MD   10 mg at 10/06/22 0819    haloperidol (HALDOL) tablet 5 mg  5 mg Oral Q8H PRN Dahlia Byes C, NP       Or   haloperidol lactate (HALDOL) injection 5 mg  5 mg Intramuscular Q8H PRN Onuoha, Josephine C, NP   5 mg at 10/02/22 1300   LORazepam (ATIVAN) tablet 2 mg  2 mg Oral Q6H PRN Lauro Franklin, MD       Or   LORazepam (ATIVAN) injection 2 mg  2 mg Intramuscular Q6H PRN Lauro Franklin, MD   2 mg at 10/02/22 1301   magnesium hydroxide (MILK OF MAGNESIA) suspension 30 mL  30 mL Oral Daily PRN Dahlia Byes C, NP       traZODone (DESYREL) tablet 100 mg  100 mg Oral QHS Earney Navy, NP  100 mg at 10/05/22 2040    Lab Results:  No results found for this or any previous visit (from the past 48 hour(s)).   Blood Alcohol level:  Lab Results  Component Value Date   ETH <10 10/01/2022   ETH <10 09/03/2022    Metabolic Disorder Labs: Lab Results  Component Value Date   HGBA1C 5.6 02/09/2022   MPG 114 02/09/2022   MPG 108.28 08/06/2021   Lab Results  Component Value Date   PROLACTIN 10.2 02/09/2022   Lab Results  Component Value Date   CHOL 134 02/09/2022   TRIG 45 02/09/2022   HDL 57 02/09/2022   CHOLHDL 2.4 02/09/2022   VLDL 9 02/09/2022   LDLCALC 68 02/09/2022   LDLCALC 73 08/06/2021    Physical Findings: AIMS:  , ,  ,  ,    CIWA:    COWS:     Musculoskeletal: Strength & Muscle Tone: within normal limits Gait & Station: normal Patient leans: N/A  Psychiatric Specialty Exam:  Presentation  General Appearance:  Casual  Eye Contact: Good  Speech: -- (mostly clear)  Speech Volume: Normal  Handedness: Right   Mood and Affect  Mood: -- ("good")  Affect: Congruent; Appropriate   Thought Process  Thought Processes: Linear  Descriptions of Associations:Intact  Orientation:Full (Time, Place and Person)  Thought Content:Other (comment) (mostly WDL)  History of Schizophrenia/Schizoaffective disorder:Yes  Duration of Psychotic  Symptoms:Greater than six months  Hallucinations:Hallucinations: Auditory Description of Auditory Hallucinations: seen responding to internal stimuli  Ideas of Reference:None  Suicidal Thoughts:Suicidal Thoughts: No  Homicidal Thoughts:Homicidal Thoughts: No   Sensorium  Memory: Immediate Fair  Judgment: Intact  Insight: Present   Executive Functions  Concentration: Good  Attention Span: Good  Recall: Fair  Fund of Knowledge: Fair  Language: Fair   Psychomotor Activity  Psychomotor Activity: Psychomotor Activity: Mannerisms   Assets  Assets: Desire for Improvement; Resilience   Sleep  Sleep: Sleep: Good Number of Hours of Sleep: 8.5    Physical Exam: Physical Exam Vitals and nursing note reviewed.  Constitutional:      General: He is not in acute distress.    Appearance: Normal appearance. He is normal weight. He is not ill-appearing or toxic-appearing.  HENT:     Head: Normocephalic and atraumatic.  Pulmonary:     Effort: Pulmonary effort is normal.  Musculoskeletal:        General: Normal range of motion.  Neurological:     General: No focal deficit present.     Mental Status: He is alert.    Review of Systems  Respiratory:  Negative for cough and shortness of breath.   Cardiovascular:  Negative for chest pain.  Gastrointestinal:  Negative for abdominal pain, constipation, diarrhea, nausea and vomiting.  Neurological:  Negative for dizziness, weakness and headaches.  Psychiatric/Behavioral:  Negative for depression, hallucinations and suicidal ideas. The patient is not nervous/anxious.    Blood pressure 103/65, pulse 96, temperature 98.7 F (37.1 C), temperature source Oral, resp. rate 18, SpO2 100%. There is no height or weight on file to calculate BMI.   Treatment Plan Summary: Daily contact with patient to assess and evaluate symptoms and progress in treatment and Medication management  Monroe Valone. Summar is a 42 yr old male  who presented on 8/26 to [facility] with bizarre behaviors and hallucinations.  PPHx is significant for Schizophrenia, Tardive Dyskinesia, Hydrocephalus SP Shunt, and Multiple Psychiatric Hospitalizations (last- [name] 02/2022).    Rockney did well off the  1:1 with no behavioral incidents.  He appears to be at baseline and have been trying to contact his father to determine if this is true.  We will continue to attempt to reach out.  We will not make any changes to his medications at this time.  We will plan for discharge in the next 2 days or so if he has reached baseline.  We will continue to monitor.    Schizophrenia: -Continue Haldol 10 mg BID for psychosis -Continue Agitation Protocol: Haldol/Ativan/Benadryl -Uphold IVC   -Continue PRN's: Tylenol, Maalox, Atarax, Milk of Magnesia, Trazodone    Lauro Franklin, MD 10/06/2022, 11:12 AM

## 2022-10-06 NOTE — Progress Notes (Signed)
   10/06/22 0600  15 Minute Checks  Location Bedroom  Visual Appearance Calm  Behavior Composed  Sleep (Behavioral Health Patients Only)  Calculate sleep? (Click Yes once per 24 hr at 0600 safety check) Yes  Documented sleep last 24 hours 8.5

## 2022-10-06 NOTE — Progress Notes (Signed)
Patient noted to be out in Dayroom on shift. Pt denied SI/HI and AVH. Writer observed patient talking to himself as if responding to internal stimuli. Medication and group compliant. Minimal interaction observed with peers. Pt able to go to the cafeteria for meals. Safety maintained.  10/06/22 0800  Psych Admission Type (Psych Patients Only)  Admission Status Involuntary  Psychosocial Assessment  Patient Complaints Suspiciousness  Eye Contact Brief  Facial Expression Flat  Affect Preoccupied  Speech Tangential  Interaction Guarded;Forwards little  Motor Activity Slow  Appearance/Hygiene Disheveled  Behavior Characteristics Guarded  Mood Suspicious  Thought Process  Coherency Disorganized;Loose associations  Content Confabulation  Delusions Paranoid  Perception Derealization  Hallucination Auditory  Judgment Impaired  Confusion Mild  Danger to Self  Current suicidal ideation? Denies  Danger to Others  Danger to Others None reported or observed

## 2022-10-06 NOTE — Plan of Care (Signed)
   Problem: Activity: Goal: Sleeping patterns will improve Outcome: Progressing   Problem: Health Behavior/Discharge Planning: Goal: Compliance with treatment plan for underlying cause of condition will improve Outcome: Progressing   Problem: Safety: Goal: Periods of time without injury will increase Outcome: Progressing   Problem: Education: Goal: Mental status will improve Outcome: Progressing

## 2022-10-06 NOTE — BHH Group Notes (Signed)
Adult Psychoeducational Group Note  Date:  10/06/2022 Time:  8:39 PM  Group Topic/Focus:  Wrap-Up Group:   The focus of this group is to help patients review their daily goal of treatment and discuss progress on daily workbooks.  Participation Level:  Active  Participation Quality:  Appropriate  Affect:  Appropriate  Cognitive:  Appropriate  Insight: Appropriate  Engagement in Group:  Engaged  Modes of Intervention:  Discussion  Additional Comments:  [name] said his overall day was a 6. The color describe his day blue. He just glad to be here best thing happen to him.  Charna Busman Long 10/06/2022, 8:39 PM

## 2022-10-06 NOTE — BHH Counselor (Signed)
Pt a little more talkative this evening, pt spoke about living in different parts of [place], pt stated he tried to work some when he was younger to help his mother out , pt continues to be disorganized with his speech at times

## 2022-10-06 NOTE — Progress Notes (Signed)
   10/06/22 2015  Psych Admission Type (Psych Patients Only)  Admission Status Involuntary  Psychosocial Assessment  Patient Complaints Suspiciousness  Eye Contact Fair  Facial Expression Flat  Affect Preoccupied  Speech Tangential  Interaction Cautious;Minimal  Motor Activity Slow  Appearance/Hygiene Disheveled  Behavior Characteristics Guarded  Mood Suspicious  Aggressive Behavior  Effect No apparent injury  Thought Process  Coherency Disorganized;Loose associations  Content Preoccupation  Delusions Paranoid  Perception Derealization;Hallucinations  Hallucination Auditory  Judgment Impaired  Confusion Mild  Danger to Self  Current suicidal ideation? Denies  Danger to Others  Danger to Others None reported or observed

## 2022-10-06 NOTE — B[facility] Group Notes (Signed)
Adult Psychoeducational Group Note  Date:  10/06/2022 Time:  7:04 PM  Group Topic/Focus:  Goals Group:   The focus of this group is to help patients establish daily goals to achieve during treatment and discuss how the patient can incorporate goal setting into their daily lives to aide in recovery. Orientation:   The focus of this group is to educate the patient on the purpose and policies of crisis stabilization and provide a format to answer questions about their admission.  The group details unit policies and expectations of patients while admitted.  Participation Level:  Did Not Attend  Participation Quality:    Affect:    Cognitive:    Insight:   Engagement in Group:    Modes of Intervention:    Additional Comments:    Sheran Lawless 10/06/2022, 7:04 PM

## 2022-10-07 ENCOUNTER — Encounter (HOSPITAL_COMMUNITY): Payer: Self-pay

## 2022-10-07 NOTE — Group Note (Signed)
Recreation Therapy Group Note   Group Topic:Coping Skills  Group Date: 10/07/2022 Start Time: 1010 End Time: 1045 Facilitators: Garrett Bowring-McCall, LRT,CTRS Location: [address] Dayroom   Goal Area(s) Addresses: Patient will define what a coping skill is. Patient will work to create a list of healthy coping skills beginning with each letter of the alphabet. Patient will successfully identify positive coping skills they can use post d/c.  Patient will acknowledge benefit(s) of using learned coping skills post d/c.   Group Description: Coping A to Z. Patient asked to identify what a coping skill is and when they use them. Patients with Clinical research associate discussed healthy versus unhealthy coping skills. Next patients were given a blank worksheet titled "Coping Skills A-Z". Patients were instructed to come up with at least one positive coping skill per letter of the alphabet. Patients were given 15 minutes to brainstorm before ideas were presented to the large group. Patients and LRT debriefed on the importance of coping skill selection based on situation and back-up plans when a skill tried is not effective. At the end of group, patients were given an handout of alphabetized strategies to keep for future reference.   Affect/Mood: N/A   Participation Level: Did not attend    Clinical Observations/Individualized Feedback:     Plan: Continue to engage patient in RT group sessions 2-3x/week.   Channin Agustin-McCall, LRT,CTRS 10/07/2022 12:56 PM

## 2022-10-07 NOTE — BH IP Treatment Plan (Signed)
Interdisciplinary Treatment and Diagnostic Plan Update  10/07/2022 Time of Session: 10:20am (UPDATE) [name] MRN: [id number]  Principal Diagnosis: Paranoid schizophrenia (HCC)  Secondary Diagnoses: Principal Problem:   Paranoid schizophrenia (HCC)   Current Medications:  Current Facility-Administered Medications  Medication Dose Route Frequency Provider Last Rate Last Admin   acetaminophen (TYLENOL) tablet 650 mg  650 mg Oral Q6H PRN Dahlia Byes C, NP       alum & mag hydroxide-simeth (MAALOX/MYLANTA) 200-200-20 MG/5ML suspension 30 mL  30 mL Oral Q4H PRN Welford Roche, Josephine C, NP       amantadine (SYMMETREL) capsule 100 mg  100 mg Oral BID Welford Roche, Josephine C, NP   100 mg at 10/07/22 0746   benztropine (COGENTIN) tablet 1 mg  1 mg Oral BID Rex Kras, MD   1 mg at 10/07/22 0746   diphenhydrAMINE (BENADRYL) capsule 25 mg  25 mg Oral Q8H PRN Dahlia Byes C, NP       Or   diphenhydrAMINE (BENADRYL) injection 25 mg  25 mg Intramuscular Q8H PRN Onuoha, Josephine C, NP   25 mg at 10/02/22 1259   haloperidol (HALDOL) tablet 10 mg  10 mg Oral BID Rex Kras, MD   10 mg at 10/07/22 0746   haloperidol (HALDOL) tablet 5 mg  5 mg Oral Q8H PRN Dahlia Byes C, NP       Or   haloperidol lactate (HALDOL) injection 5 mg  5 mg Intramuscular Q8H PRN Dahlia Byes C, NP   5 mg at 10/02/22 1300   LORazepam (ATIVAN) tablet 2 mg  2 mg Oral Q6H PRN Lauro Franklin, MD       Or   LORazepam (ATIVAN) injection 2 mg  2 mg Intramuscular Q6H PRN Lauro Franklin, MD   2 mg at 10/02/22 1301   magnesium hydroxide (MILK OF MAGNESIA) suspension 30 mL  30 mL Oral Daily PRN Dahlia Byes C, NP       traZODone (DESYREL) tablet 100 mg  100 mg Oral QHS Onuoha, Josephine C, NP   100 mg at 10/06/22 2036   PTA Medications: Medications Prior to Admission  Medication Sig Dispense Refill Last Dose   amantadine (SYMMETREL) 100 MG capsule Take 100 mg by mouth 2 (two) times  daily.      traZODone (DESYREL) 100 MG tablet Take 100 mg by mouth at bedtime.       Patient Stressors:    Patient Strengths:    Treatment Modalities: Medication Management, Group therapy, Case management,  1 to 1 session with clinician, Psychoeducation, Recreational therapy.   Physician Treatment Plan for Primary Diagnosis: Paranoid schizophrenia (HCC) Long Term Goal(s): Improvement in symptoms so as ready for discharge   Short Term Goals: Ability to identify changes in lifestyle to reduce recurrence of condition will improve Ability to maintain clinical measurements within normal limits will improve Ability to identify triggers associated with substance abuse/mental health issues will improve  Medication Management: Evaluate patient's response, side effects, and tolerance of medication regimen.  Therapeutic Interventions: 1 to 1 sessions, Unit Group sessions and Medication administration.  Evaluation of Outcomes: Adequate for Discharge  Physician Treatment Plan for Secondary Diagnosis: Principal Problem:   Paranoid schizophrenia (HCC)  Long Term Goal(s): Improvement in symptoms so as ready for discharge   Short Term Goals: Ability to identify changes in lifestyle to reduce recurrence of condition will improve Ability to maintain clinical measurements within normal limits will improve Ability to identify triggers associated with substance abuse/mental health issues  will improve     Medication Management: Evaluate patient's response, side effects, and tolerance of medication regimen.  Therapeutic Interventions: 1 to 1 sessions, Unit Group sessions and Medication administration.  Evaluation of Outcomes: Adequate for Discharge   RN Treatment Plan for Primary Diagnosis: Paranoid schizophrenia (HCC) Long Term Goal(s): Knowledge of disease and therapeutic regimen to maintain health will improve  Short Term Goals: Ability to remain free from injury will improve, Ability to  verbalize frustration and anger appropriately will improve, Ability to participate in decision making will improve, Ability to verbalize feelings will improve, Ability to identify and develop effective coping behaviors will improve, and Compliance with prescribed medications will improve  Medication Management: RN will administer medications as ordered by provider, will assess and evaluate patient's response and provide education to patient for prescribed medication. RN will report any adverse and/or side effects to prescribing provider.  Therapeutic Interventions: 1 on 1 counseling sessions, Psychoeducation, Medication administration, Evaluate responses to treatment, Monitor vital signs and CBGs as ordered, Perform/monitor CIWA, COWS, AIMS and Fall Risk screenings as ordered, Perform wound care treatments as ordered.  Evaluation of Outcomes: Adequate for Discharge   LCSW Treatment Plan for Primary Diagnosis: Paranoid schizophrenia (HCC) Long Term Goal(s): Safe transition to appropriate next level of care at discharge, Engage patient in therapeutic group addressing interpersonal concerns.  Short Term Goals: Engage patient in aftercare planning with referrals and resources, Increase social support, Increase emotional regulation, Facilitate acceptance of mental health diagnosis and concerns, Identify triggers associated with mental health/substance abuse issues, and Increase skills for wellness and recovery  Therapeutic Interventions: Assess for all discharge needs, 1 to 1 time with Social worker, Explore available resources and support systems, Assess for adequacy in community support network, Educate family and significant other(s) on suicide prevention, Complete Psychosocial Assessment, Interpersonal group therapy.  Evaluation of Outcomes: Adequate for Discharge   Progress in Treatment: Attending groups: No. Participating in groups: No. Taking medication as prescribed: No. Toleration  medication: Yes. Family/Significant other contact made: No, will contact:  Pt is disorganized Patient understands diagnosis: No. Discussing patient identified problems/goals with staff: Yes. Medical problems stabilized or resolved: Yes. Denies suicidal/homicidal ideation: No. Issues/concerns per patient self-inventory: Yes. Other: N/A   New problem(s) identified: No, Describe:  None reported   New Short Term/Long Term Goal(s):   Patient Goals:  None stated   Discharge Plan or Barriers: Patient recently admitted. CSW will continue to follow and assess for appropriate referrals and possible discharge planning.    Reason for Continuation of Hospitalization: Delusions  Hallucinations Medication stabilization   Estimated Length of Stay: adequate for discharge  Last 3 [place] Suicide Severity Risk Score: Flowsheet Row ED from 09/30/2022 in [facility] Emergency Department at [facility] ED from 09/03/2022 in [facility] Emergency Department at [facility] ED from 08/10/2022 in [facility] Emergency Department at [facility]  C-SSRS RISK CATEGORY No Risk No Risk No Risk       Last [facility] 2/9 Scores:    08/03/2021    3:05 PM 02/20/2021    4:52 PM 11/28/2020    3:55 PM  Depression screen PHQ 2/9  Decreased Interest 1 1 1   Down, Depressed, Hopeless 1 2 1   PHQ - 2 Score 2 3 2   Altered sleeping 0 2 1  Tired, decreased energy 0 1 1  Change in appetite 1 2 1   Feeling bad or failure about yourself  0 1 1  Trouble concentrating 1 1 1  Moving slowly or fidgety/restless 1 1 1   Suicidal thoughts 0 0 1  PHQ-9 Score 5 11 9   Difficult doing work/chores Somewhat difficult Very difficult Somewhat difficult    Scribe for Treatment Team: Izell [place], [place] 10/07/2022 10:25 AM

## 2022-10-07 NOTE — Progress Notes (Signed)
   10/07/22 1100  Psych Admission Type (Psych Patients Only)  Admission Status Involuntary  Psychosocial Assessment  Patient Complaints Suspiciousness  Eye Contact Fair  Facial Expression Flat  Affect Preoccupied  Speech Tangential  Interaction Minimal;Guarded  Motor Activity Slow  Appearance/Hygiene Disheveled  Behavior Characteristics Guarded  Mood Suspicious  Thought Process  Coherency Disorganized;Loose associations  Content Preoccupation  Delusions Paranoid  Perception Derealization;Hallucinations  Hallucination Auditory  Judgment Poor  Confusion Mild  Danger to Self  Current suicidal ideation? Denies  Danger to Others  Danger to Others None reported or observed

## 2022-10-07 NOTE — Progress Notes (Signed)
[name]  MRN:  [id number] Subjective:   [name] is a 42 yr old male who presented on 8/26 to [facility] with bizarre behaviors and hallucinations.  PPHx is significant for Schizophrenia, Tardive Dyskinesia, Hydrocephalus SP Shunt, and Multiple Psychiatric Hospitalizations (last- [name] 02/2022).    Case was discussed in the multidisciplinary team. MAR was reviewed and patient was compliant with medications.  He received no PRN's yesterday.   Psychiatric Team made the following recommendations yesterday: -Continue Haldol 10 mg BID for psychosis -Continue Agitation Protocol: Haldol/Ativan/Benadryl -Uphold IVC -Stopped 1:1   On interview today patient reports he slept good last night.  He reports his appetite is doing good.  He reports no SI, HI, or AVH(noted by staff to be responding to internal stimuli).  He reports no Paranoia or Ideas of Reference.  He reports no issues with his medications.  Discussed with him that we had attempted to contact his father and had not been able to yesterday.  Discussed we would try again today as we were beginning to plan for discharge.  He is agreeable with this and reports no other concerns are present.    Called patient's father, [name], [phone].  He reports that he has not spoken to [name] while he has been in the hospital.  Discussed with him that he was showing improvement with his medications and he reports that if the patient is doing better he is okay with the patient being discharged home tomorrow discussed with him we would ask the patient to call him that he is agreeable with this.  He reports no other concerns at present.   Principal Problem: Paranoid schizophrenia (HCC) Diagnosis: Principal Problem:   Paranoid schizophrenia (HCC)  Total Time spent with patient:  I personally spent 35 minutes on the unit in direct patient care. The direct patient care time included  face-to-face time with the patient, reviewing the patient's chart, communicating with other professionals, and coordinating care. Greater than 50% of this time was spent in counseling or coordinating care with the patient regarding goals of hospitalization, psycho-education, and discharge planning needs.   Past Psychiatric History: Schizophrenia, Tardive Dyskinesia, Hydrocephalus SP Shunt, and Multiple Psychiatric Hospitalizations (last- [name] 02/2022).   Past Medical History:  Past Medical History:  Diagnosis Date   Schizophrenia [facility])     Past Surgical History:  Procedure Laterality Date   shunt surgery     Family History:  Family History  Problem Relation Age of Onset   Stroke Mother    Thyroid disease Father    Family Psychiatric  History:  Maternal Aunt: some unknown diagnosis No known Substance Abuse or Suicides   Social History:  Social History   Substance and Sexual Activity  Alcohol Use Not Currently     Social History   Substance and Sexual Activity  Drug Use Not Currently   Types: Marijuana    Social History   Socioeconomic History   Marital status: Single    Spouse name: Not on file   Number of children: Not on file   Years of education: Not on file   Highest education level: Not on file  Occupational History   Not on file  Tobacco Use   Smoking status: Every Day    Types: Cigars   Smokeless tobacco: Never  Vaping Use   Vaping status: Former  Substance and Sexual Activity   Alcohol use: Not Currently   Drug use:  Not Currently    Types: Marijuana   Sexual activity: Not on file  Other Topics Concern   Not on file  Social History Narrative   Not on file   Social Determinants of Health   Financial Resource Strain: Patient Declined (02/10/2022)   Received from [facility], [facility]   Overall Financial Resource Strain (CARDIA)    Difficulty of Paying Living Expenses: Patient declined  Food Insecurity: Food Insecurity Present  (02/11/2022)   Received from [facility], [facility]   Hunger Vital Sign    Worried About Running Out of Food in the Last Year: Sometimes true    Ran Out of Food in the Last Year: Sometimes true  Transportation Needs: Patient Declined (02/10/2022)   Received from [facility], [facility]   [facility]    Lack of Transportation (Medical): Patient declined    Lack of Transportation (Non-Medical): Patient declined  Physical Activity: Inactive (02/10/2022)   Received from [facility], [facility]   Exercise Vital Sign    Days of Exercise per Week: 0 days    Minutes of Exercise per Session: 0 min  Stress: Patient Declined (02/10/2022)   Received from [facility], [facility] - Occupational Stress Questionnaire    Feeling of Stress : Patient declined  Social Connections: Patient Declined (02/10/2022)   Received from [facility], [facility]   Social Connection and Isolation Panel [NHANES]    Frequency of Communication with Friends and Family: Patient declined    Frequency of Social Gatherings with Friends and Family: Patient declined    Attends Religious Services: Patient declined    Database administrator or Organizations: Patient declined    Attends Engineer, structural: Patient declined    Marital Status: Patient declined   Additional Social History:                         Sleep: Good  Appetite:  Good  Current Medications: Current Facility-Administered Medications  Medication Dose Route Frequency Provider Last Rate Last Admin   acetaminophen (TYLENOL) tablet 650 mg  650 mg Oral Q6H PRN [name], Tyler C, NP       alum & mag hydroxide-simeth (MAALOX/MYLANTA) 200-200-20 MG/5ML suspension 30 mL  30 mL Oral Q4H PRN [name], Tyler C, NP       amantadine (SYMMETREL) capsule 100 mg  100 mg Oral BID [name], Tyler C, NP   100 mg at 10/07/22 0746   benztropine (COGENTIN)  tablet 1 mg  1 mg Oral BID Tyler Kras, MD   1 mg at 10/07/22 0746   diphenhydrAMINE (BENADRYL) capsule 25 mg  25 mg Oral Q8H PRN Tyler Byes C, NP       Or   diphenhydrAMINE (BENADRYL) injection 25 mg  25 mg Intramuscular Q8H PRN [name], Tyler C, NP   25 mg at 10/02/22 1259   haloperidol (HALDOL) tablet 10 mg  10 mg Oral BID Tyler Kras, MD   10 mg at 10/07/22 0746   haloperidol (HALDOL) tablet 5 mg  5 mg Oral Q8H PRN Tyler Byes C, NP       Or   haloperidol lactate (HALDOL) injection 5 mg  5 mg Intramuscular Q8H PRN [name], Tyler C, NP   5 mg at 10/02/22 1300   LORazepam (ATIVAN) tablet 2 mg  2 mg Oral Q6H PRN [name],  Tyler Matte, MD       Or   LORazepam (ATIVAN) injection 2 mg  2 mg Intramuscular Q6H PRN Tyler Franklin, MD   2 mg at 10/02/22 1301   magnesium hydroxide (MILK OF MAGNESIA) suspension 30 mL  30 mL Oral Daily PRN Tyler Byes C, NP       traZODone (DESYREL) tablet 100 mg  100 mg Oral QHS Tyler Byes C, NP   100 mg at 10/06/22 2036    Lab Results:  No results found for this or any previous visit (from the past 48 hour(s)).   Blood Alcohol level:  Lab Results  Component Value Date   ETH <10 10/01/2022   ETH <10 09/03/2022    Metabolic Disorder Labs: Lab Results  Component Value Date   HGBA1C 5.6 02/09/2022   MPG 114 02/09/2022   MPG 108.28 08/06/2021   Lab Results  Component Value Date   PROLACTIN 10.2 02/09/2022   Lab Results  Component Value Date   CHOL 134 02/09/2022   TRIG 45 02/09/2022   HDL 57 02/09/2022   CHOLHDL 2.4 02/09/2022   VLDL 9 02/09/2022   LDLCALC 68 02/09/2022   LDLCALC 73 08/06/2021    Physical Findings: AIMS:  , ,  ,  ,    CIWA:    COWS:     Musculoskeletal: Strength & Muscle Tone: within normal limits Gait & Station: normal Patient leans: N/A  Psychiatric Specialty Exam:  Presentation  General Appearance:  Casual  Eye Contact: Good  Speech: -- (mostly  clear)  Speech Volume: Normal  Handedness: Right   Mood and Affect  Mood: -- ("good")  Affect: Appropriate; Congruent   Thought Process  Thought Processes: Linear  Descriptions of Associations:Intact  Orientation:Full (Time, Place and Person)  Thought Content:-- (mostly WDL)  History of Schizophrenia/Schizoaffective disorder:Yes  Duration of Psychotic Symptoms:Greater than six months  Hallucinations:Hallucinations: Auditory; Visual Description of Auditory Hallucinations: seen responding to internal stimuli (baseline) Description of Visual Hallucinations: occasionaly reports snakes  Ideas of Reference:None  Suicidal Thoughts:Suicidal Thoughts: No  Homicidal Thoughts:Homicidal Thoughts: No   Sensorium  Memory: Immediate Fair  Judgment: Intact  Insight: Present   Executive Functions  Concentration: Good  Attention Span: Good  Recall: Fair  Fund of Knowledge: Fair  Language: Fair   Psychomotor Activity  Psychomotor Activity: Psychomotor Activity: Mannerisms   Assets  Assets: Desire for Improvement; Resilience   Sleep  Sleep: Sleep: Fair Number of Hours of Sleep: 5    Physical Exam: Physical Exam Vitals and nursing note reviewed.  Constitutional:      General: He is not in acute distress.    Appearance: Normal appearance. He is normal weight. He is not ill-appearing or toxic-appearing.  HENT:     Head: Normocephalic and atraumatic.  Pulmonary:     Effort: Pulmonary effort is normal.  Musculoskeletal:        General: Normal range of motion.  Neurological:     Mental Status: He is alert.    Review of Systems  Respiratory:  Negative for cough and shortness of breath.   Cardiovascular:  Negative for chest pain.  Gastrointestinal:  Negative for abdominal pain, constipation, diarrhea, nausea and vomiting.  Neurological:  Negative for dizziness, weakness and headaches.  Psychiatric/Behavioral:  Negative for depression,  hallucinations (reports none but seen responding to internal stimuli) and suicidal ideas. The patient is not nervous/anxious.    Blood pressure 102/63, pulse 82, temperature 98.2 F (36.8 Solis), temperature source Oral, resp. rate  16, SpO2 97%. There is no height or weight on file to calculate BMI.   Treatment Plan Summary: Daily contact with patient to assess and evaluate symptoms and progress in treatment and Medication management  [name]. Braden is a 42 yr old male who presented on 8/26 to [facility] with bizarre behaviors and hallucinations.  PPHx is significant for Schizophrenia, Tardive Dyskinesia, Hydrocephalus SP Shunt, and Multiple Psychiatric Hospitalizations (last- [name] 02/2022).    Julez has continued to do well on his Haldol.  We were able to contact his father and plan for discharge tomorrow as he does appear to be at baseline.  We will not make any changes to his medications at this time.  We will continue to monitor.     Schizophrenia: -Continue Haldol 10 mg BID for psychosis -Continue Agitation Protocol: Haldol/Ativan/Benadryl -Uphold IVC   -Continue PRN's: Tylenol, Maalox, Atarax, Milk of Magnesia, Trazodone    Tyler Franklin, MD 10/07/2022, 1:08 PM

## 2022-10-07 NOTE — Plan of Care (Signed)
  Problem: Education: Goal: Mental status will improve Outcome: Progressing Goal: Verbalization of understanding the information provided will improve Outcome: Progressing   Problem: Activity: Goal: Interest or engagement in activities will improve Outcome: Progressing Goal: Sleeping patterns will improve Outcome: Progressing   Problem: Coping: Goal: Ability to verbalize frustrations and anger appropriately will improve Outcome: Progressing Goal: Ability to demonstrate self-control will improve Outcome: Progressing   Problem: Health Behavior/Discharge Planning: Goal: Identification of resources available to assist in meeting health care needs will improve Outcome: Progressing Goal: Compliance with treatment plan for underlying cause of condition will improve Outcome: Progressing   Problem: Physical Regulation: Goal: Ability to maintain clinical measurements within normal limits will improve Outcome: Progressing   Problem: Safety: Goal: Periods of time without injury will increase Outcome: Progressing

## 2022-10-07 NOTE — Progress Notes (Signed)
   10/07/22 0530  15 Minute Checks  Location Bedroom  Visual Appearance Calm  Behavior Composed  Sleep (Behavioral Health Patients Only)  Calculate sleep? (Click Yes once per 24 hr at 0600 safety check) Yes  Documented sleep last 24 hours 5

## 2022-10-07 NOTE — Progress Notes (Signed)
   10/07/22 2015  Psych Admission Type (Psych Patients Only)  Admission Status Involuntary  Psychosocial Assessment  Patient Complaints Suspiciousness;Worrying  Eye Contact Fair  Facial Expression Flat  Affect Preoccupied  Speech Tangential  Interaction Cautious;Minimal  Motor Activity Slow  Appearance/Hygiene Disheveled  Behavior Characteristics Cooperative;Anxious;Guarded;Fidgety  Mood Suspicious;Preoccupied  Aggressive Behavior  Effect No apparent injury  Thought Process  Coherency Disorganized;Loose associations  Content Preoccupation  Delusions Paranoid  Perception Derealization;Hallucinations  Hallucination Auditory  Judgment Impaired  Confusion Mild  Danger to Self  Current suicidal ideation? Denies  Danger to Others  Danger to Others None reported or observed

## 2022-10-07 NOTE — Group Note (Signed)
Date:  10/07/2022 Time:  10:00 AM  Group Topic/Focus:  Goals Group:   The focus of this group is to help patients establish daily goals to achieve during treatment and discuss how the patient can incorporate goal setting into their daily lives to aide in recovery.    Participation Level:  Active  Participation Quality:  Appropriate  Affect:  Appropriate  Cognitive:  Appropriate  Insight: Appropriate  Engagement in Group:  Engaged  Modes of Intervention:  Discussion  Additional Comments:  Patient attended morning orientation/setting group and participated.   Grant Swager W Chrystel Barefield 10/07/2022, 10:00 AM

## 2022-10-07 NOTE — [facility] Group Notes (Signed)
Pt was encouraged but refused to attend group discussion  

## 2022-10-07 NOTE — Plan of Care (Signed)
  Problem: Activity: Goal: Sleeping patterns will improve Outcome: Progressing   Problem: Health Behavior/Discharge Planning: Goal: Compliance with treatment plan for underlying cause of condition will improve Outcome: Progressing   Problem: Safety: Goal: Periods of time without injury will increase Outcome: Progressing   Problem: Education: Goal: Emotional status will improve Outcome: Not Progressing Goal: Mental status will improve Outcome: Not Progressing

## 2022-10-08 MED ORDER — HALOPERIDOL 10 MG PO TABS: 10.0000 mg | ORAL_TABLET | Freq: Two times a day (BID) | ORAL | 0 refills | Status: DC

## 2022-10-08 MED ORDER — BENZTROPINE MESYLATE 1 MG PO TABS: 1.0000 mg | ORAL_TABLET | Freq: Two times a day (BID) | ORAL | 0 refills | Status: AC

## 2022-10-08 NOTE — Plan of Care (Signed)
Patient was unable to meet goal due to attending only one group session. During group session, patient was drowsy and having a hard time staying awake.    Jocelyn Nold-McCall, LRT,CTRS

## 2022-10-08 NOTE — Progress Notes (Signed)
Discharge Note:  Patient denies SI/HI/AVH at this time. Patient is responding to internal stimuli but is calm, pleasant and cooperative.Discharge instructions, AVS, prescriptions, and transition record gone over with patient's father Chrissie Noa. Patient agrees to comply with medication management, follow-up visit, and outpatient therapy. Patient belongings returned to patient. Patient questions and concerns addressed and answered. Patient ambulatory off unit. Patient discharged to home with father.
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
## 2022-10-08 NOTE — [facility] Suicide Risk Assessment (Signed)
[facility] INPATIENT:  Family/Significant Other Suicide Prevention Education  Suicide Prevention Education:  Education Completed; [name] [phone] (Father) has been identified by the patient as the family member/significant other with whom the patient will be residing, and identified as the person(s) who will aid the patient in the event of a mental health crisis (suicidal ideations/suicide attempt).  With written consent from the patient, the family member/significant other has been provided the following suicide prevention education, prior to the and/or following the discharge of the patient.  The suicide prevention education provided includes the following: Suicide risk factors Suicide prevention and interventions National Suicide Hotline telephone number [facility] assessment telephone number [facility] Emergency Assistance 911 [facility] and/or Residential Mobile Crisis Unit telephone number  Request made of family/significant other to: Remove weapons (e.g., guns, rifles, knives), all items previously/currently identified as safety concern.   Remove drugs/medications (over-the-counter, prescriptions, illicit drugs), all items previously/currently identified as a safety concern.  Father [name] verbalizes understanding of the suicide prevention education information provided.  The family member/significant other agrees to remove the items of safety concern listed above.  [name] 10/08/2022, 9:39 AM

## 2022-10-08 NOTE — Progress Notes (Signed)
Recreation Therapy Notes  INPATIENT RECREATION TR PLAN  Patient Details Name: MARKAEL HYDER MRN: [id number] DOB: 1980/04/04 Today's Date: 10/08/2022  Rec Therapy Plan Is patient appropriate for Therapeutic Recreation?: Yes Treatment times per week: about 3 days Estimated Length of Stay: 5-7 days TR Treatment/Interventions: Group participation (Comment)  Discharge Criteria Pt will be discharged from therapy if:: Discharged Treatment plan/goals/alternatives discussed and agreed upon by:: Patient/family  Discharge Summary Short term goals set: See patient care plan Short term goals met: Not met Progress toward goals comments: Groups attended Which groups?: Other (Comment) (Team Building) Reason goals not met: Pt attended one group session and was drowsy/sleepy during group session. Therapeutic equipment acquired: N/A Reason patient discharged from therapy: Discharge from hospital Pt/family agrees with progress & goals achieved: Yes Date patient discharged from therapy: 10/08/22   Myrtle Barnhard-McCall, LRT,CTRS [place] A Jasen Hartstein-McCall 10/08/2022, 1:55 PM

## 2022-10-08 NOTE — Progress Notes (Signed)
  [facility] Adult Case Management Discharge Plan :  Will you be returning to the same living situation after discharge:  Yes,  Laurann Montana (Father) At discharge, do you have transportation home?: Yes,  Righteous [place] (Father) Do you have the ability to pay for your medications: Yes,  Insured  Release of information consent forms completed and in the chart;  Patient's signature needed at discharge.  Patient to Follow up at:  Follow-up Information     [facility]. Go to.   Specialty: Behavioral Health Why: Please go to this provider for therapy and medication management services.  For fastest service, please walk in on Monday through Friday, arrive by 7:00 am for same day service. Contact information: [address] [place] [address] [phone]        [facility]. Schedule an appointment as soon as possible for a visit in 7 day(s).   Why: Please call to make an appointment within 7 days of discharge Contact information: [address]  [address] [place] [address] [phone]         [facility] .   Contact information: [address] [place] [address] [phone]                 Next level of care provider has access to [facility] Link:no  Safety Planning and Suicide Prevention discussed: Yes,  Laurann Montana (Father)     Has patient been referred to the Quitline?: Patient refused referral for treatment  Patient has been referred for addiction treatment: Yes, referral information given but appointment not made [facility] (list facility). Patient to continue working towards treatment goals after discharge. Patient no longer meets criteria for inpatient criteria per attending physician. Continue taking medications as prescribed, nursing to provide instructions at discharge. Follow up with all scheduled appointments.   Cadan Maggart S Bernise Sylvain, LCSW 10/08/2022, 9:42 AM

## 2022-10-08 NOTE — BHH Suicide Risk Assessment (Addendum)
Suicide Risk Assessment  Discharge Assessment    [facility] Discharge Suicide Risk Assessment   Principal Problem: Paranoid schizophrenia [facility]) Discharge Diagnoses: Principal Problem:   Paranoid schizophrenia (HCC)  During the patient's hospitalization, patient had extensive initial psychiatric evaluation, and follow-up psychiatric evaluations every day.  Psychiatric diagnoses provided upon initial assessment: Paranoid Schizophrenia  Patient's psychiatric medications were adjusted on admission: Started on Haldol  During the hospitalization, other adjustments were made to the patient's psychiatric medication regimen: No changes where made during his hospitalization.  Gradually, patient started adjusting to milieu.   Patient's care was discussed during the interdisciplinary team meeting every day during the hospitalization.  The patient is not having side effects to prescribed psychiatric medication.  The patient reports their target psychiatric symptoms of aggression responded well to the psychiatric medications, and the patient reports overall benefit other psychiatric hospitalization. Supportive psychotherapy was provided to the patient. The patient also participated in regular group therapy while admitted.   Labs were reviewed with the patient, and abnormal results were discussed with the patient.  The patient denied having suicidal thoughts more than 48 hours prior to discharge.  Patient denies having homicidal thoughts.  Patient denies having auditory hallucinations.  Patient denies any visual hallucinations.  Patient denies having paranoid thoughts.  The patient is able to verbalize their individual safety plan to this provider.  It is recommended to the patient to continue psychiatric medications as prescribed, after discharge from the hospital.    It is recommended to the patient to follow up with your outpatient psychiatric provider and PCP.  Discussed with the patient, the impact  of alcohol, drugs, tobacco have been there overall psychiatric and medical wellbeing, and total abstinence from substance use was recommended the patient.  Total Time spent with patient: 20 minutes  Musculoskeletal: Strength & Muscle Tone: within normal limits Gait & Station: normal Patient leans: N/A  Psychiatric Specialty Exam  Presentation  General Appearance:  Casual; Appropriate for Environment  Eye Contact: Good  Speech: -- (mix of clear and coherent and garbled)  Speech Volume: Normal  Handedness: Right   Mood and Affect  Mood: Euthymic  Duration of Depression Symptoms: -- (UTA, due to pt's rambling speech.)  Affect: Congruent; Appropriate   Thought Process  Thought Processes: Linear  Descriptions of Associations:Intact  Orientation:Full (Time, Place and Person)  Thought Content:-- (mostly WDL occasionally tangential)  History of Schizophrenia/Schizoaffective disorder:Yes  Duration of Psychotic Symptoms:Greater than six months  Hallucinations:Hallucinations: Other (comment) (Reports none but seen/heard responding to internal stimuli) Description of Auditory Hallucinations: seen responding to internal stimuli (baseline) Description of Visual Hallucinations: occasionaly reports snakes  Ideas of Reference:None  Suicidal Thoughts:Suicidal Thoughts: No  Homicidal Thoughts:Homicidal Thoughts: No   Sensorium  Memory: Immediate Fair  Judgment: Intact  Insight: Present   Executive Functions  Concentration: Good  Attention Span: Good  Recall: Fair  Fund of Knowledge: Fair  Language: Fair   Psychomotor Activity  Psychomotor Activity: Psychomotor Activity: Mannerisms   Assets  Assets: Desire for Improvement; Resilience; Social Support; Housing   Sleep  Sleep: Sleep: Good Number of Hours of Sleep: 6.5   Physical Exam: Physical Exam Vitals and nursing note reviewed.  Constitutional:      General: He is not in  acute distress.    Appearance: Normal appearance. He is normal weight. He is not ill-appearing or toxic-appearing.  HENT:     Head: Normocephalic and atraumatic.  Pulmonary:     Effort: Pulmonary effort is normal.  Musculoskeletal:  General: Normal range of motion.  Neurological:     Mental Status: He is alert.    Review of Systems  Respiratory:  Negative for cough and shortness of breath.   Cardiovascular:  Negative for chest pain.  Gastrointestinal:  Negative for abdominal pain, constipation, diarrhea, nausea and vomiting.  Neurological:  Negative for dizziness, weakness and headaches.  Psychiatric/Behavioral:  Negative for depression, hallucinations (reports none but seen/heard responding to internal stimuli) and suicidal ideas. The patient is not nervous/anxious.    Blood pressure 100/68, pulse 82, temperature 97.7 F (36.5 C), temperature source Oral, resp. rate 18, SpO2 100%. There is no height or weight on file to calculate BMI.  Mental Status Per Nursing Assessment::   On Admission:     Demographic Factors:  Unemployed  Loss Factors: NA  Historical Factors: NA  Risk Reduction Factors:   Living with another person, especially a relative and Positive social support  Continued Clinical Symptoms:  Schizophrenia:   Paranoid or undifferentiated type  Cognitive Features That Contribute To Risk:  Loss of executive function    Suicide Risk:  Minimal: No identifiable suicidal ideation.  Patients presenting with no risk factors but with morbid ruminations; may be classified as minimal risk based on the severity of the depressive symptoms   Follow-up Information     [facility]. Go to.   Specialty: Behavioral Health Why: Please go to this provider for therapy and medication management services.  For fastest service, please walk in on Monday through Friday, arrive by 7:00 am for same day service. Contact information: [address] [place] [address] [phone]        [facility]. Schedule an appointment as soon as possible for a visit in 7 day(s).   Why: Please call to make an appointment within 7 days of discharge Contact information: [address]  [address] [place] [address] [phone]         [facility] .   Contact information: [address] [place] [address] [phone]                 Plan Of Care/Follow-up recommendations:  Activity: as tolerated  Diet: heart healthy  Other: -Follow-up with your outpatient psychiatric provider -instructions on appointment date, time, and address (location) are provided to you in discharge paperwork.  -Take your psychiatric medications as prescribed at discharge - instructions are provided to you in the discharge paperwork  -Follow-up with outpatient primary care doctor and other specialists -for management of chronic medical disease, including: Abnormal CBC.  -Testing: Follow-up with outpatient provider for abnormal lab results: Abnormal CBC.  -Recommend abstinence from alcohol, tobacco, and other illicit drug use at discharge.   -If your psychiatric symptoms recur, worsen, or if you have side effects to your psychiatric medications, call your outpatient psychiatric provider, [phone], [phone] or go to the nearest emergency department.  -If suicidal thoughts recur, call your outpatient psychiatric provider, 911, [phone] or go to the nearest emergency department.   Lauro Franklin, MD 10/08/2022, 11:44 AM

## 2022-10-08 NOTE — Progress Notes (Signed)
   10/08/22 0800  Psych Admission Type (Psych Patients Only)  Admission Status Involuntary  Psychosocial Assessment  Patient Complaints Worrying  Eye Contact Fair  Facial Expression Flat  Affect Preoccupied  Speech Tangential  Interaction Cautious;Minimal  Motor Activity Slow  Appearance/Hygiene Disheveled;In scrubs  Behavior Characteristics Cooperative  Thought Process  Coherency Disorganized;Loose associations  Content Preoccupation  Delusions Paranoid  Perception Derealization;Hallucinations  Hallucination Auditory  Judgment Impaired  Confusion Mild  Danger to Self  Current suicidal ideation? Denies  Danger to Others  Danger to Others None reported or observed

## 2022-10-25 ENCOUNTER — Other Ambulatory Visit: Payer: Self-pay

## 2022-10-25 ENCOUNTER — Emergency Department (HOSPITAL_COMMUNITY)
Admission: EM | Admit: 2022-10-25 | Discharge: 2022-10-26 | Disposition: A | Discharge status: Other Acute Inpatient Hospital | Payer: MEDICAID | Attending: Emergency Medicine | Admitting: Emergency Medicine

## 2022-10-25 ENCOUNTER — Encounter (HOSPITAL_COMMUNITY): Payer: Self-pay

## 2022-10-25 ENCOUNTER — Emergency Department (HOSPITAL_COMMUNITY): Payer: MEDICAID

## 2022-10-25 DIAGNOSIS — F29 Unspecified psychosis not due to a substance or known physiological condition: Secondary | ICD-10-CM

## 2022-10-25 DIAGNOSIS — F200 Paranoid schizophrenia: Secondary | ICD-10-CM | POA: Diagnosis not present

## 2022-10-25 DIAGNOSIS — R45851 Suicidal ideations: Secondary | ICD-10-CM | POA: Insufficient documentation

## 2022-10-25 DIAGNOSIS — R4182 Altered mental status, unspecified: Secondary | ICD-10-CM | POA: Diagnosis present

## 2022-10-25 LAB — COMPREHENSIVE METABOLIC PANEL
ALT: 13 U/L (ref 0–44)
AST: 25 U/L (ref 15–41)
Albumin: 3.9 g/dL (ref 3.5–5.0)
Alkaline Phosphatase: 72 U/L (ref 38–126)
Anion gap: 13 (ref 5–15)
BUN: 19 mg/dL (ref 6–20)
CO2: 25 mmol/L (ref 22–32)
Calcium: 8.8 mg/dL — ABNORMAL LOW (ref 8.9–10.3)
Chloride: 98 mmol/L (ref 98–111)
Creatinine, Ser: 1.11 mg/dL (ref 0.61–1.24)
GFR, Estimated: >60 mL/min (ref >60)
Glucose, Bld: 189 mg/dL — ABNORMAL HIGH (ref 70–99)
Potassium: 2.8 mmol/L — ABNORMAL LOW (ref 3.5–5.1)
Sodium: 136 mmol/L (ref 135–145)
Total Bilirubin: 0.8 mg/dL (ref 0.3–1.2)
Total Protein: 8 g/dL (ref 6.5–8.1)

## 2022-10-25 LAB — CBC
HCT: 38.5 % — ABNORMAL LOW (ref 39.0–52.0)
Hemoglobin: 13.1 g/dL (ref 13.0–17.0)
MCH: 26 pg (ref 26.0–34.0)
MCHC: 34 g/dL (ref 30.0–36.0)
MCV: 76.4 fL — ABNORMAL LOW (ref 80.0–100.0)
Platelets: 420 10*3/uL — ABNORMAL HIGH (ref 150–400)
RBC: 5.04 MIL/uL (ref 4.22–5.81)
RDW: 16.7 % — ABNORMAL HIGH (ref 11.5–15.5)
WBC: 14.5 10*3/uL — ABNORMAL HIGH (ref 4.0–10.5)
nRBC: 0 % (ref 0.0–0.2)

## 2022-10-25 LAB — SALICYLATE LEVEL: Salicylate Lvl: <7 mg/dL — ABNORMAL LOW (ref 7.0–30.0)

## 2022-10-25 LAB — ETHANOL: Alcohol, Ethyl (B): <10 mg/dL (ref <10)

## 2022-10-25 LAB — ACETAMINOPHEN LEVEL: Acetaminophen (Tylenol), Serum: <10 ug/mL — ABNORMAL LOW (ref 10–30)

## 2022-10-25 MED ORDER — TRAZODONE HCL 100 MG PO TABS: 100.0000 mg | ORAL_TABLET | Freq: Every day | ORAL | Status: DC

## 2022-10-25 MED ORDER — POTASSIUM CHLORIDE 10 MEQ/100ML IV SOLN
10.0000 meq | INTRAVENOUS | Status: AC
  Administered 2022-10-25 (×2): 10 meq via INTRAVENOUS
  Filled 2022-10-25 (×3): qty 100

## 2022-10-25 MED ORDER — HALOPERIDOL 5 MG PO TABS
5.0000 mg | ORAL_TABLET | Freq: Two times a day (BID) | ORAL | Status: DC
  Administered 2022-10-25: 5 mg via ORAL
  Filled 2022-10-25: qty 1

## 2022-10-25 MED ORDER — AMANTADINE HCL 100 MG PO CAPS
100.0000 mg | ORAL_CAPSULE | Freq: Two times a day (BID) | ORAL | Status: DC
  Administered 2022-10-25: 100 mg via ORAL
  Filled 2022-10-25: qty 1

## 2022-10-25 MED ORDER — BENZTROPINE MESYLATE 1 MG PO TABS
1.0000 mg | ORAL_TABLET | Freq: Two times a day (BID) | ORAL | Status: DC
  Administered 2022-10-25: 1 mg via ORAL
  Filled 2022-10-25: qty 1

## 2022-10-25 MED ORDER — POTASSIUM CHLORIDE CRYS ER 20 MEQ PO TBCR
40.0000 meq | EXTENDED_RELEASE_TABLET | Freq: Once | ORAL | Status: AC
  Administered 2022-10-25: 40 meq via ORAL
  Filled 2022-10-25: qty 2

## 2022-10-25 MED ORDER — HALOPERIDOL LACTATE 5 MG/ML IJ SOLN
5.0000 mg | INTRAMUSCULAR | Status: DC | PRN
  Administered 2022-10-25: 5 mg via INTRAMUSCULAR
  Filled 2022-10-25: qty 1

## 2022-10-25 NOTE — Progress Notes (Signed)
LCSW Progress Note  [id number]   MYCA LAOS  10/25/2022  2:37 PM  Description:   Inpatient Psychiatric Referral  Patient was recommended inpatient per [facility], PMHNP. There are no available beds at [facility], per [facility], RN. Patient was referred to the following out of network facilities:   [facility] Provider Address Phone Fax  [facility]  [address], [place] [address] [phone]  [facility]  [address] [place], [place] [address] [phone]  [facility]  [address] [place] [address] [phone]  [facility]  [address], [place] [address] [phone]  [facility]  [address] [place] [address] [phone]  [facility]  [address] [place] [address] [phone]  [facility]  [address], [place] [address] [phone]  [facility]  [place] [phone]  [facility] EFAX  [address] [place], [place] [phone]  [facility]  [address] [place] [address] [phone]  [facility]  [address], [place] [address] [phone]  [facility]  [address] [place] [address] [phone]  [facility]  [address] [place] [address] [phone]  [facility]  [place] [phone]  [facility]  [address] [place], [place] [address] [phone]    Situation ongoing, CSW to continue following and update chart as more  information becomes available.    Cathie Beams, [place]  10/25/2022 2:38 PM

## 2022-10-25 NOTE — ED Notes (Signed)
Pts father at bedside with PA

## 2022-10-25 NOTE — ED Notes (Addendum)
Pt changed from his personal belongings into purple scrubs per policy, belongings placed into pt belongings bag with pt label and placed in cabinet across form [address]. Pt had on 3 paper scrub top under his personal shirt from previous visit that were discarded.   Security [place] pt for safety as per [employer]

## 2022-10-25 NOTE — ED Notes (Signed)
Lunch tray given. 

## 2022-10-25 NOTE — ED Notes (Signed)
Pt agitation was escalating. Pt yelling and wave hands. Pt was voluntarily given PRN haldol ordered IM left shoulder. Pt now resting comfortably

## 2022-10-25 NOTE — ED Notes (Signed)
Dinner tray given

## 2022-10-25 NOTE — ED Notes (Signed)
Father at bedside going home at this time. Is taking pts belongings at bedside. Phone number verified with father

## 2022-10-25 NOTE — ED Provider Notes (Signed)
[place] EMERGENCY DEPARTMENT AT [facility] Provider Note   CSN: [id number] Arrival date & time: 10/25/22  0258     History  Chief Complaint  Patient presents with   Altered Mental Status    [name] is a 42 y.o. male with medical history of schizophrenia, tardive dyskinesia.  Patient presents to the ED for evaluation of altered mental status.  The patient arrives in custody of [employer].  Per [employer] officer, patient was found to be stealing items tonight from local convenient store.  When [employer] officer came to arrest patient, the patient "pulled away" while he was placed in cuffs.  [employer] officer is concerned the patient has left shoulder injury.  Here, patient rambling nonsensically.  [employer] officer states that the patient has been designated as missing person for the last 2 days, called in by his father.  Patient is still in maroon scrubs from previous psych hold.   Altered Mental Status      Home Medications Prior to Admission medications   Medication Sig Start Date End Date Taking? Authorizing Provider  amantadine (SYMMETREL) 100 MG capsule Take 100 mg by mouth 2 (two) times daily. 09/17/22   [provider]  benztropine (COGENTIN) 1 MG tablet Take 1 tablet (1 mg total) by mouth 2 (two) times daily. 10/08/22   Lauro Franklin, MD  haloperidol (HALDOL) 10 MG tablet Take 1 tablet (10 mg total) by mouth 2 (two) times daily. 10/08/22   Lauro Franklin, MD  traZODone (DESYREL) 100 MG tablet Take 100 mg by mouth at bedtime.    [provider]      Allergies    Shellfish-derived products, Hydroxyzine, Mirtazapine, Olanzapine, Risperdal [risperidone], and Temazepam    Review of Systems   Review of Systems  Unable to perform ROS: Mental status change (Level 5 caveat)    Physical Exam Updated Vital Signs BP (!) 113/92   Pulse (!) 111   Temp 97.8 F (36.6 C) (Oral)   Resp 16   Ht 5\' 11"  (1.803 m)   Wt 63.4 kg   SpO2 99%   BMI 19.49 kg/m   Physical Exam Vitals and nursing note reviewed.  Constitutional:      General: He is not in acute distress.    Appearance: He is not toxic-appearing.  HENT:     Head: Normocephalic and atraumatic.  Pulmonary:     Effort: No respiratory distress.  Skin:    Coloration: Skin is not jaundiced or pale.  Neurological:     Mental Status: He is alert and oriented to person, place, and time.  Psychiatric:        Speech: He is noncommunicative.     ED Results / Procedures / Treatments   Labs (all labs ordered are listed, but only abnormal results are displayed) Labs Reviewed  CBC - Abnormal; Notable for the following components:      Result Value   WBC 14.5 (*)    HCT 38.5 (*)    MCV 76.4 (*)    RDW 16.7 (*)    Platelets 420 (*)    All other components within normal limits  COMPREHENSIVE METABOLIC PANEL - Abnormal; Notable for the following components:   Potassium 2.8 (*)    Glucose, Bld 189 (*)    Calcium 8.8 (*)    All other components within normal limits  SALICYLATE LEVEL - Abnormal; Notable for the following components:   Salicylate Lvl <7.0 (*)    All other components  within normal limits  ACETAMINOPHEN LEVEL - Abnormal; Notable for the following components:   Acetaminophen (Tylenol), Serum <10 (*)    All other components within normal limits  ETHANOL  URINALYSIS, ROUTINE W REFLEX MICROSCOPIC  RAPID URINE DRUG SCREEN, HOSP PERFORMED    EKG EKG Interpretation Date/Time:  Friday October 25 2022 06:19:54 EDT Ventricular Rate:  95 PR Interval:  140 QRS Duration:  96 QT Interval:  366 QTC Calculation: 459 R Axis:   72  Text Interpretation: Normal sinus rhythm Nonspecific T wave abnormality Abnormal ECG No significant change since last tracing Confirmed by Zadie Rhine ([id number]) on 10/25/2022 6:26:33 AM  Radiology CT Head Wo Contrast  Result Date: 10/25/2022 CLINICAL DATA:  42 year old male altered mental status. Pain, agitated. EXAM: CT HEAD WITHOUT CONTRAST  TECHNIQUE: Contiguous axial images were obtained from the base of the skull through the vertex without intravenous contrast. RADIATION DOSE REDUCTION: This exam was performed according to the departmental dose-optimization program which includes automated exposure control, adjustment of the mA and/or kV according to patient size and/or use of iterative reconstruction technique. COMPARISON:  Head CT 08/06/2021. 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 Underlying dysgenesis of the corpus callosum. Stable cerebral volume. No midline shift, ventriculomegaly, mass effect, evidence of mass lesion, intracranial hemorrhage or evidence of cortically based acute infarction. Gray-white differentiation remains stable and normal. Vascular: No suspicious intracranial vascular hyperdensity. Skull: No acute osseous abnormality identified. Chronic left posterior burr hole. Sinuses/Orbits: Visualized paranasal sinuses and mastoids are stable and well aerated. Other: Chronic left posterior pro she CSF shunt. Reservoir and shunt tubing redemonstrated with no adverse features identified. Chronic widespread scalp nodular soft tissue thickening, nonspecific. Visualized orbit soft tissues are within normal limits. IMPRESSION: 1. No acute intracranial abnormality or acute traumatic injury identified. 2. 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 3. Nonspecific chronic diffuse scalp soft tissue nodularity. Neurofibromatosis can appear similar. Electronically Signed   By: Odessa Fleming M.D.   On: 10/25/2022 06:12    Procedures Procedures   Medications Ordered in ED Medications   haloperidol lactate (HALDOL) injection 5 mg (5 mg Intramuscular Given by Other 10/25/22 0458)  potassium chloride 10 mEq in 100 mL IVPB (has no administration in time range)  potassium chloride SA (KLOR-CON M) CR tablet 40 mEq (has no administration in time range)    ED Course/ Medical Decision Making/ A&P  Medical Decision Making Amount and/or Complexity of Data Reviewed Labs: ordered. Radiology: ordered.  Risk Prescription drug management.   42 year old male presents to ED for evaluation.  Please see HPI for further details.  On examination the patient is afebrile, tachycardic.  Lung sounds are clear bilaterally, nonhypoxic.  Abdomen soft and compressible throughout.  Patient right shoulder, left shoulder have full range of motion and no tenderness noted.  Patient is mumbling nonsensical words to himself but is able to follow commands such as squeezing my hand when I ask any squeezes my hand.  Patient father arrived to provide collateral information.  The patient father reports that he last saw the patient today.  Patient father states that he went to take his niece to an appointment and when he came back the patient was gone from  his house.  Patient father states that this is a common occurrence and the patient is off his medication.  The patient father reports that the patient is been off of his medication for the last 1 week.  The patient apparently takes Haldol 10 mg twice daily.  Patient father states that he has been noncompliant on his medication.  Patient father denies that the patient does drugs or alcohol.  Patient CBC shows leukocytosis of 14.5, no anemia.  Metabolic panel shows potassium 2.8, glucose 189.  His anion gap is 13, his LFTs not elevated, his creatinine is 1.11.  Patient was provided 10 mEq IV potassium x 3, 40 mEq oral potassium.  Patient salicylate, acetaminophen, ethanol undetectable.  Rapid urine drug screen and urinalysis pending at this time.  Patient given  Haldol for agitation 5 mg IM injection.  CT scan of patient head obtained to assess for altered mental status.  CT scan shows no acute findings.  At this time, patient is pending TTS consult.  Patient potassium has been ordered.  EKG shows normal sinus rhythm.  At end of shift, the patient was signed out to oncoming provider Riki Sheer, PA-C pending TTS consult.  Plan of management discussed with oncoming provider.   Final Clinical Impression(s) / ED Diagnoses Final diagnoses:  Altered mental status, unspecified altered mental status type  Psychosis, unspecified psychosis type [facility])    Rx / DC Orders ED Discharge Orders     None         Clent Ridges 10/25/22 0630    Zadie Rhine, MD 10/25/22 [phone]

## 2022-10-25 NOTE — ED Notes (Signed)
Pt. Currently in his room loudly talking to his self.

## 2022-10-25 NOTE — ED Triage Notes (Signed)
Pt BIB police. Police states that pt stole something and when they touched him he complained of right shoulder pain. Pt is not talking and when being spoken to, he is looking around.

## 2022-10-25 NOTE — Consult Note (Signed)
[facility] ED ASSESSMENT   Reason for Consult: Psych Consult Referring Physician:  Delice Bison, PA-C Patient Identification: [name] MRN:  [id number] ED Chief Complaint: Altered mental status, unspecified  Diagnosis:  Principal Problem:   Altered mental status, unspecified Active Problems:   Paranoid schizophrenia [facility])   ED Assessment Time Calculation: Start Time: 1000 Stop Time: 1020 Total Time in Minutes (Assessment Completion): 20    Subjective: [name] is a 42 y.o. male patient who presented on 8/26 to [facility] with bizarre behaviors and hallucinations.  PPHx is significant for Schizophrenia, Tardive Dyskinesia, Hydrocephalus SP Shunt, and Multiple Psychiatric Hospitalizations.    HPI: [name], 42 y.o., male patient seen face to face by this provider, consulted with Dr. Lucianne Muss; and chart reviewed on 10/25/22.  On evaluation AUDON MOHNEY is visualized mumbling to himself throughout assessment, incoherent and decreased volume, he fixes his eyes towards straight gaze, staring at wall.  Appears to be actively responding to internal stimuli. Son is floridly psychotic and grossly disorganized in his thoughts. During evaluation ASHISH KHOURI is laying in his bed, staring fixedly at the wall, rigorously rubbing his hands together under his blanket. He is alert, unable to assess orientation. He has normal speech.     Past Psychiatric History: paranoid schizophrenia   Risk to Self or Others: Risk to Self: Yes due to his acute psychosis  Risk to Others: Yes Prior Inpatient Therapy:  Yes   Prior Outpatient Therapy:  No     [place] Scale:  Flowsheet Row ED from 10/25/2022 in [facility] Emergency Department at [facility] ED from [facility]30[facility]20[facility] 09/03/2022 in [facility] Emergency Department at [facility]  C-SSRS RISK CATEGORY No Risk No Risk No Risk       AIMS:  , , ,  ,   ASAM:     Substance Abuse:     Past Medical History:  Past Medical History:  Diagnosis Date   Schizophrenia (HCC)     Past Surgical History:  Procedure Laterality Date   shunt surgery     Family History:  Family History  Problem Relation Age of Onset   Stroke Mother    Thyroid disease Father      Social History:  Social History   Substance and Sexual Activity  Alcohol Use Not Currently     Social History   Substance and Sexual Activity  Drug Use Not Currently   Types: Marijuana    Social History   Socioeconomic History   Marital status: Single    Spouse name: Not on file   Number of children: Not on file   Years of education: Not on file   Highest education level: Not on file  Occupational History   Not on file  Tobacco Use   Smoking status: Every Day    Types: Cigars   Smokeless tobacco: Never  Vaping Use   Vaping status: Former  Substance and Sexual Activity   Alcohol use: Not Currently   Drug use: Not Currently    Types: Marijuana   Sexual activity: Not on file  Other Topics Concern   Not on file  Social History Narrative   Not on file   Social Determinants of Health   Financial Resource Strain: Patient Declined (02/10/2022)   Received from [facility], [facility]   Overall Financial Resource Strain (CARDIA)    Difficulty of Paying Living Expenses:  Patient declined  Food Insecurity: Food Insecurity Present (02/11/2022)   Received from [facility], [facility]   Hunger Vital Sign    Worried About Running Out of Food in the Last Year: Sometimes true    Ran Out of Food in the Last Year: Sometimes true  Transportation Needs: Patient Declined (02/10/2022)   Received from [facility], [facility]   [facility] - Transportation    Lack of Transportation (Medical): Patient declined    Lack of Transportation (Non-Medical): Patient declined  Physical Activity: Inactive (02/10/2022)   Received from [facility], [facility]   Exercise  Vital Sign    Days of Exercise per Week: 0 days    Minutes of Exercise per Session: 0 min  Stress: Patient Declined (02/10/2022)   Received from [facility], [facility] - Occupational Stress Questionnaire    Feeling of Stress : Patient declined  Social Connections: Patient Declined (02/10/2022)   Received from [facility], [facility]   Social Connection and Isolation Panel [NHANES]    Frequency of Communication with Friends and Family: Patient declined    Frequency of Social Gatherings with Friends and Family: Patient declined    Attends Religious Services: Patient declined    Database administrator or Organizations: Patient declined    Attends Banker Meetings: Patient declined    Marital Status: Patient declined      Allergies:   Allergies  Allergen Reactions   Shellfish-Derived Products Anaphylaxis   Hydroxyzine Other (See Comments)    Made the patient "too lethargic" (also may have caused Tardive dyskinesia)   Mirtazapine Other (See Comments)    Made the patient "too lethargic" (also may have caused Tardive dyskinesia)   Olanzapine Other (See Comments)    Made the patient "too lethargic" (also may have caused Tardive dyskinesia)   Risperdal [Risperidone] Other (See Comments)    Tardive dyskinesia   Temazepam Other (See Comments)    Made the patient "too lethargic" (also may have caused Tardive dyskinesia)    Labs:  Results for orders placed or performed during the hospital encounter of 10/25/22 (from the past 48 hour(s))  CBC     Status: Abnormal   Collection Time: 10/25/22  3:50 AM  Result Value Ref Range   WBC 14.5 (H) 4.0 - 10.5 K/uL   RBC 5.04 4.22 - 5.81 MIL/uL   Hemoglobin 13.1 13.0 - 17.0 g/dL   HCT 16.1 (L) 09.6 - 04.5 %   MCV 76.4 (L) 80.0 - 100.0 fL   MCH 26.0 26.0 - 34.0 pg   MCHC 34.0 30.0 - 36.0 g/dL   RDW 40.9 (H) 81.1 - 91.4 %   Platelets 420 (H) 150 - 400 K/uL   nRBC 0.0 0.0 -  0.2 %    Comment: Performed at [facility], [address], [place], [place] [address]  Comprehensive metabolic panel     Status: Abnormal   Collection Time: 10/25/22  3:50 AM  Result Value Ref Range   Sodium 136 135 - 145 mmol/L   Potassium 2.8 (L) 3.5 - 5.1 mmol/L   Chloride 98 98 - 111 mmol/L   CO2 25 22 - 32 mmol/L   Glucose, Bld 189 (H) 70 - 99 mg/dL    Comment: Glucose reference range applies only to samples taken after fasting for at least 8 hours.   BUN 19 6 - 20 mg/dL  Creatinine, Ser 1.11 0.61 - 1.24 mg/dL   Calcium 8.8 (L) 8.9 - 10.3 mg/dL   Total Protein 8.0 6.5 - 8.1 g/dL   Albumin 3.9 3.5 - 5.0 g/dL   AST 25 15 - 41 U/L   ALT 13 0 - 44 U/L   Alkaline Phosphatase 72 38 - 126 U/L   Total Bilirubin 0.8 0.3 - 1.2 mg/dL   GFR, Estimated >47 >42 mL/min    Comment: (NOTE) Calculated using the CKD-EPI Creatinine Equation (2021)    Anion gap 13 5 - 15    Comment: Performed at [facility], [address], [place], [place] [address]  Salicylate level     Status: Abnormal   Collection Time: 10/25/22  3:50 AM  Result Value Ref Range   Salicylate Lvl <7.0 (L) 7.0 - 30.0 mg/dL    Comment: Performed at [facility], [address] [place], [place] [address]  Ethanol     Status: None   Collection Time: 10/25/22  3:50 AM  Result Value Ref Range   Alcohol, Ethyl (B) <10 <10 mg/dL    Comment: (NOTE) Lowest detectable limit for serum alcohol is 10 mg/dL.  For medical purposes only. Performed at [facility], [address], [place], [place] [address]   Acetaminophen level     Status: Abnormal   Collection Time: 10/25/22  3:50 AM  Result Value Ref Range   Acetaminophen (Tylenol), Serum <10 (L) 10 - 30 ug/mL    Comment: (NOTE) Therapeutic concentrations vary significantly. A range of 10-30 ug/mL  may be an effective concentration for many patients. However, some  are best treated at concentrations  outside of this range. Acetaminophen concentrations >150 ug/mL at 4 hours after ingestion  and >50 ug/mL at 12 hours after ingestion are often associated with  toxic reactions.  Performed at [facility], [address], [place], [place] [address]     Current Facility-Administered Medications  Medication Dose Route Frequency Provider Last Rate Last Admin   amantadine (SYMMETREL) capsule 100 mg  100 mg Oral BID Motley-Mangrum, Canyon Lohr A, PMHNP       benztropine (COGENTIN) tablet 1 mg  1 mg Oral BID Motley-Mangrum, Delita Chiquito A, PMHNP       haloperidol (HALDOL) tablet 5 mg  5 mg Oral BID Motley-Mangrum, Ashea Winiarski A, PMHNP       haloperidol lactate (HALDOL) injection 5 mg  5 mg Intramuscular PRN Delice Bison F, PA-C   5 mg at 10/25/22 0458   traZODone (DESYREL) tablet 100 mg  100 mg Oral QHS Motley-Mangrum, Akeylah Hendel A, PMHNP       Current Outpatient Medications  Medication Sig Dispense Refill   amantadine (SYMMETREL) 100 MG capsule Take 100 mg by mouth 2 (two) times daily.     benztropine (COGENTIN) 1 MG tablet Take 1 tablet (1 mg total) by mouth 2 (two) times daily. 60 tablet 0   haloperidol (HALDOL) 10 MG tablet Take 1 tablet (10 mg total) by mouth 2 (two) times daily. 60 tablet 0   traZODone (DESYREL) 100 MG tablet Take 100 mg by mouth at bedtime.      Musculoskeletal:  Observed patient resting in bed   Psychiatric Specialty Exam: Presentation  General Appearance:  Disheveled; Bizarre  Eye Contact: None  Speech: Garbled; Pressured  Speech Volume: Decreased  Handedness: Right   Mood and Affect  Mood: Anxious  Affect: Flat; Inappropriate   Thought Process  Thought Processes: Disorganized  Descriptions of  Associations:Tangential  Orientation:None  Thought Content:Illogical; Tangential  History of Schizophrenia/Schizoaffective disorder:Yes  Duration of Psychotic Symptoms:Greater than six months  Hallucinations:Hallucinations: Other  (comment) (unable to assess, patient is disorganized and ams)  Ideas of Reference:Other (comment) (unable to assess, patient is disorganized and ams)  Suicidal Thoughts:Suicidal Thoughts: -- (unable to assess, patient is disorganized and ams)  Homicidal Thoughts:Homicidal Thoughts: -- (unable to assess, patient is disorganized and ams)   Sensorium  Memory: Other (comment) (unable to assess, patient is disorganized and ams)  Judgment: Impaired  Insight: Lacking   Executive Functions  Concentration: Poor  Attention Span: Poor  Recall: Other (comment) (unable to assess, patient is disorganized and ams)  Fund of Knowledge: Other (comment) (unable to assess, patient is disorganized and ams)  Language: Poor   Psychomotor Activity  Psychomotor Activity:Psychomotor Activity: Increased; Restlessness   Assets  Assets: Communication Skills    Sleep  Sleep:Sleep: -- (unable to assess, patient is disorganized and ams)   Physical Exam: Physical Exam Vitals and nursing note reviewed.  Neurological:     Mental Status: He is alert.  Psychiatric:        Attention and Perception: He is inattentive.        Mood and Affect: Affect is flat and inappropriate.        Speech: Speech is rapid and pressured and tangential.        Behavior: Behavior is uncooperative.        Thought Content: Thought content is paranoid and delusional.        Cognition and Memory: Cognition is impaired.        Judgment: Judgment is impulsive and inappropriate.    Review of Systems  Psychiatric/Behavioral:  Positive for hallucinations.        Delusional   Blood pressure (!) 113/92, pulse (!) 102, temperature 97.8 F (36.6 C), temperature source Oral, resp. rate 16, height 5\' 11"  (1.803 m), weight 63.4 kg, SpO2 99%. Body mass index is 19.49 kg/m.   Medical Decision Making: Patient case review and discussed with?Dr. Lucianne Muss. Patient needs inpatient psychiatric admission for stabilization and  treatment.    Disposition: Recommend psychiatric Inpatient admission. Added agitation protocol, Haldol/Ativan/Benadryl. Patient is being faxed out to psychiatric facilities.    Alona Bene, PMHNP 10/25/2022 12:08 PM

## 2022-10-25 NOTE — Progress Notes (Signed)
Pt has been accepted to [facility] TODAY 10/25/2022  Pt meets inpatient criteria per Alona Bene, PMHNP  Attending Physician will be Sherrian Divers, MD  Report can be called to: [phone]  Pt can arrive anytime today  Care Team Notified: Latricia Heft, Paramedic, Staci Acosta, LCSWA, and [facility], PMHNP   [place], [place]  10/25/2022 3:55 PM

## 2022-10-25 NOTE — BH Assessment (Signed)
Spoke with Angelica Chessman, RN, patient medicated. Patient will be seen by dayshift for psych provider.

## 2022-10-25 NOTE — ED Notes (Signed)
Pt. Refused vitals again. We will attempt again at another time.

## 2022-10-25 NOTE — ED Notes (Signed)
Pt. Refused to do vitals. Will attempt again at a later time.

## 2022-10-25 NOTE — ED Notes (Signed)
Pts Belongings given to pts father

## 2022-10-26 NOTE — ED Provider Notes (Signed)
Emergency Medicine Observation Re-evaluation Note  [name] is a 42 y.o. male, seen on rounds today.  Pt initially presented to the ED for complaints of Altered Mental Status Currently, the patient is ready .  Physical Exam  BP 128/88 (BP Location: Right Arm)   Pulse 98   Temp 97.9 F (36.6 C) (Oral)   Resp 17   Ht 1.803 m (5\' 11" )   Wt 63.4 kg   SpO2 100%   BMI 19.49 kg/m  Physical Exam   ED Course / MDM  EKG:EKG Interpretation Date/Time:  Friday October 25 2022 06:19:54 EDT Ventricular Rate:  95 PR Interval:  140 QRS Duration:  96 QT Interval:  366 QTC Calculation: 459 R Axis:   72  Text Interpretation: Normal sinus rhythm Nonspecific T wave abnormality Abnormal ECG No significant change since last tracing Confirmed by Zadie Rhine ([id number]) on 10/25/2022 6:26:33 AM  I have reviewed the labs performed to date as well as medications administered while in observation.  Recent changes in the last 24 hours include .  Plan  Current plan is for transfer.    Lorre Nick, MD 10/26/22 [phone]

## 2022-10-26 NOTE — ED Provider Notes (Signed)
Accepted handoff at shift change from [facility]. Please see prior provider note for more detail.   Briefly: Patient is 42 y.o. with schizophrenia who presented for altered mental status.  DDX: concern for sepsis, acute psychosis, stroke, ACS  Plan: Awaiting TTS consult.  Likely inpatient.   Physical Exam  BP (!) 134/95 (BP Location: Right Arm)   Pulse 82   Temp (!) 97.5 F (36.4 C) (Oral)   Resp 18   Ht 5\' 11"  (1.803 m)   Wt 63.4 kg   SpO2 97%   BMI 19.49 kg/m   Physical Exam  Procedures  Procedures  ED Course / MDM   Clinical Course as of 10/26/22 0748  Fri Oct 25, 2022  0445 Patient father has arrived to provide collateral.  Patient father reports that he last saw the patient today.  Patient father states that over the last 1 week the patient has not been taking his psychiatric medication, Haldol 10 mg twice daily.  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 [CG]  [id number] Found at gas station, mumbling, though to have shoulder injury, not taking haldol at home. CT scan d/t shunt but was normal. TTS consult. Likely inpatient. Awaiting tts recs. [JR]    Clinical Course User Index [CG] Al Decant, PA-C [JR] Gareth Eagle, PA-C   Medical Decision Making Amount and/or Complexity of Data Reviewed Labs: ordered. Radiology: ordered.  Risk Prescription drug management.   On reassessment, patient is still well-appearing, comfortably eating his breakfast and appropriately interacting with staff.  Per TTS evaluation, advised inpatient evaluation.  Admitted.       Gareth Eagle, PA-C 10/26/22 1610    Linwood Dibbles, MD 10/26/22 [phone]

## 2023-04-16 ENCOUNTER — Ambulatory Visit: Payer: Self-pay | Admitting: Nurse Practitioner

## 2023-07-03 ENCOUNTER — Other Ambulatory Visit: Payer: Self-pay

## 2023-07-03 ENCOUNTER — Emergency Department (HOSPITAL_COMMUNITY)
Admission: EM | Admit: 2023-07-03 | Discharge: 2023-07-04 | Disposition: A | Discharge status: Other Acute Inpatient Hospital | Payer: MEDICAID | Attending: Emergency Medicine | Admitting: Emergency Medicine

## 2023-07-03 ENCOUNTER — Ambulatory Visit (HOSPITAL_COMMUNITY)
Admission: EM | Admit: 2023-07-03 | Discharge: 2023-07-03 | Disposition: A | Discharge status: Other Acute Inpatient Hospital | Payer: MEDICAID | Attending: Behavioral Health | Admitting: Behavioral Health

## 2023-07-03 ENCOUNTER — Encounter (HOSPITAL_COMMUNITY): Payer: Self-pay

## 2023-07-03 DIAGNOSIS — R7309 Other abnormal glucose: Secondary | ICD-10-CM | POA: Insufficient documentation

## 2023-07-03 DIAGNOSIS — F29 Unspecified psychosis not due to a substance or known physiological condition: Secondary | ICD-10-CM | POA: Diagnosis present

## 2023-07-03 DIAGNOSIS — R410 Disorientation, unspecified: Secondary | ICD-10-CM | POA: Insufficient documentation

## 2023-07-03 DIAGNOSIS — Z7689 Persons encountering health services in other specified circumstances: Secondary | ICD-10-CM

## 2023-07-03 DIAGNOSIS — D649 Anemia, unspecified: Secondary | ICD-10-CM | POA: Insufficient documentation

## 2023-07-03 DIAGNOSIS — F209 Schizophrenia, unspecified: Secondary | ICD-10-CM

## 2023-07-03 DIAGNOSIS — F200 Paranoid schizophrenia: Secondary | ICD-10-CM | POA: Insufficient documentation

## 2023-07-03 LAB — COMPREHENSIVE METABOLIC PANEL WITH GFR
ALT: 11 U/L (ref 0–44)
AST: 19 U/L (ref 15–41)
Albumin: 3.2 g/dL — ABNORMAL LOW (ref 3.5–5.0)
Alkaline Phosphatase: 49 U/L (ref 38–126)
Anion gap: 10 (ref 5–15)
BUN: 11 mg/dL (ref 6–20)
CO2: 26 mmol/L (ref 22–32)
Calcium: 8.9 mg/dL (ref 8.9–10.3)
Chloride: 105 mmol/L (ref 98–111)
Creatinine, Ser: 0.93 mg/dL (ref 0.61–1.24)
GFR, Estimated: >60 mL/min (ref >60)
Glucose, Bld: 162 mg/dL — ABNORMAL HIGH (ref 70–99)
Potassium: 3 mmol/L — ABNORMAL LOW (ref 3.5–5.1)
Sodium: 141 mmol/L (ref 135–145)
Total Bilirubin: 0.2 mg/dL (ref 0.0–1.2)
Total Protein: 7.2 g/dL (ref 6.5–8.1)

## 2023-07-03 LAB — SALICYLATE LEVEL: Salicylate Lvl: <7 mg/dL — ABNORMAL LOW (ref 7.0–30.0)

## 2023-07-03 LAB — CBC
HCT: 33.1 % — ABNORMAL LOW (ref 39.0–52.0)
Hemoglobin: 11.3 g/dL — ABNORMAL LOW (ref 13.0–17.0)
MCH: 25 pg — ABNORMAL LOW (ref 26.0–34.0)
MCHC: 34.1 g/dL (ref 30.0–36.0)
MCV: 73.2 fL — ABNORMAL LOW (ref 80.0–100.0)
Platelets: 425 10*3/uL — ABNORMAL HIGH (ref 150–400)
RBC: 4.52 MIL/uL (ref 4.22–5.81)
RDW: 15.9 % — ABNORMAL HIGH (ref 11.5–15.5)
WBC: 8.1 10*3/uL (ref 4.0–10.5)
nRBC: 0 % (ref 0.0–0.2)

## 2023-07-03 LAB — ETHANOL: Alcohol, Ethyl (B): <15 mg/dL (ref <15)

## 2023-07-03 LAB — ACETAMINOPHEN LEVEL: Acetaminophen (Tylenol), Serum: <10 ug/mL — ABNORMAL LOW (ref 10–30)

## 2023-07-03 MED ORDER — ZIPRASIDONE MESYLATE 20 MG IM SOLR
20.0000 mg | Freq: Once | INTRAMUSCULAR | Status: AC
  Administered 2023-07-03: 20 mg via INTRAMUSCULAR
  Filled 2023-07-03: qty 20

## 2023-07-03 MED ORDER — LORAZEPAM 2 MG/ML IJ SOLN
2.0000 mg | Freq: Once | INTRAMUSCULAR | Status: AC
  Administered 2023-07-03: 2 mg via INTRAMUSCULAR
  Filled 2023-07-03: qty 1

## 2023-07-03 MED ORDER — POTASSIUM CHLORIDE CRYS ER 20 MEQ PO TBCR: 60.0000 meq | EXTENDED_RELEASE_TABLET | Freq: Once | ORAL | Status: DC

## 2023-07-03 NOTE — ED Notes (Signed)
 Pt becoming agitated and refused when asked to dress out in blue scrubs.

## 2023-07-03 NOTE — BH Assessment (Addendum)
 Comprehensive Clinical Assessment (CCA) Note  07/03/2023 [name] [id number]  DISPOSITION: Per [name], recommend inpatient psychiatric admission and IVC  The patient demonstrates the following risk factors for suicide: Chronic risk factors for suicide include: psychiatric disorder of schizophrenia. Acute risk factors for suicide include: unemployment and social withdrawal/isolation. Protective factors for this patient include: hope for the future. Considering these factors, the overall suicide risk at this point appears to be low. Patient is not appropriate for outpatient follow up.   Pt is a 43 yo male who presented via Patent examiner. Pt could not tell me why law enforcement picked him up or brought him into the [facility]. Pt was disoriented, rambling in varied volumes in a flight of ideas, tangential thought process. Pt was sitting calmly but at times getting increasingly upset by what he was saying and the thoughts that were related in his mind. For example, while asking basic orientation questions such as who is the President of the [place] , pt answered "I don't know" and then began rambling speech about how he does not follow politics, then switching to talking about walking through the high grass and getting bitten by tics. At first pt could not answer what his name was and had trouble confirming his name when given to him by this assessor. When asked his DOB he got the month right, the date wrong and the year wrong by many years (answered: 1973.) When asked about SI, HI and AVH specifically, pt became quiet and appropriately answered "no" to each. Pt stated that he has been admitted to a psychiatric hospital "many times." Pt could not answer as to where he lives or with whom.    Chief Complaint: psychotic symptoms  Visit Diagnosis:  Schizophrenia    CCA Screening, Triage and Referral (STR)  Patient Reported Information How did you hear about us ? Legal System (brought in by law  enforcement)  What Is the Reason for Your Visit/Call Today? Pt is a 43 yo male who presented via Patent examiner. Pt could not tell me why law enforcement picked him up or brought him into the [facility]. Pt was disoriented, rambling in varied volumes in a flight of ideas, tangential thought process. Pt was sitting calmly but at times getting increasingly upset by what he was saying and the thoughts that were related in his mind. For example, while asking basic orientation questions such as who is the President of the [place] , pt answered "I don't know" and then began rambling speech about how he does not follow politics, then switching to talking about walking through the high grass and getting bitting by tics. At first pt could not answer what his name was and had trouble confirming his name when given to him by this assessor. When asked his DOB he got the month right, the date wrong and the year wrong by many years (answered: 1973.) When asked about SI, HI and AVH specifically, pt became quiet and appropriately answered "no" to each. Pt stated that he has been admitted to a psychiatric hospital "many times." Pt could not answer as to where he lives or with whom.  How Long Has This Been Causing You Problems? > than 6 months  What Do You Feel Would Help You the Most Today? Treatment for Depression or other mood problem   Have You Recently Had Any Thoughts About Hurting Yourself? No (denied)  Are You Planning to Commit Suicide/Harm Yourself At This time? No   Flowsheet Row ED from 07/03/2023  in [facility] ED from 10/25/2022 in [facility] Emergency Department at [facility] ED from 09/30/2022 in [facility] Emergency Department at [facility]  C-SSRS RISK CATEGORY No Risk No Risk No Risk       Have you Recently Had Thoughts About Hurting Someone Marigene Shoulder? No  Are You Planning to Harm Someone at This Time? No  Explanation: Pt denies, HI.   Have You Used Any  Alcohol or Drugs in the Past 24 Hours? No (denied any substance use)  How Long Ago Did You Use Drugs or Alcohol? na What Did You Use and How Much? Pt denies.   Do You Currently Have a Therapist/Psychiatrist? -- (Pt unable to answer due to disorganization and psychosis)  Name of Therapist/Psychiatrist:    Have You Been Recently Discharged From Any Office Practice or Programs? -- (Pt unable to answer due to disorganization and psychosis)  Explanation of Discharge From Practice/Program: UTA, due to pt's rambling ongoing speech.     CCA Screening Triage Referral Assessment Type of Contact: Face-to-Face  Telemedicine Service Delivery:   Is this Initial or Reassessment?   Date Telepsych consult ordered in [facility]:    Time Telepsych consult ordered in [facility]:    Location of Assessment: [facility] Services  Provider Location: [facility] Assessment Services   Collateral Involvement: None.   Does Patient Have a Automotive engineer Guardian? -- (none reported)  Legal Guardian Contact Information: na  Copy of Legal Guardianship Form: -- (na)  Legal Guardian Notified of Arrival: -- (na)  Legal Guardian Notified of Pending Discharge: -- (na)  If Minor and Not Living with Parent(s), Who has Custody? adult  Is CPS involved or ever been involved? -- (none reported)  Is APS involved or ever been involved? -- (none reported)   Patient Determined To Be At Risk for Harm To Self or Others Based on Review of Patient Reported Information or Presenting Complaint? No  Method: No Plan  Availability of Means: No access or NA  Intent: Vague intent or NA  Notification Required: No need or identified person  Additional Information for Danger to Others Potential: Active psychosis  Additional Comments for Danger to Others Potential: Pt denies, HI.  Are There Guns or Other Weapons in Your Home? No  Types of Guns/Weapons: Pt denies, access to weapons.  Are These Weapons Safely Secured?                             No  Who Could Verify You Are Able To Have These Secured: Pt denies, access to weapons.  Do You Have any Outstanding Charges, Pending Court Dates, Parole/Probation? denied  Contacted To Inform of Risk of Harm To Self or Others: -- (na)    Does Patient Present under Involuntary Commitment? No    [place] of Residence: [place]   Patient Currently Receiving the Following Services: Not Receiving Services   Determination of Need: Urgent (48 hours)   Options For Referral: Inpatient Hospitalization; [facility] Urgent Care; Medication Management; Outpatient Therapy     CCA Biopsychosocial Patient Reported Schizophrenia/Schizoaffective Diagnosis in Past: Yes   Strengths: Pt unable to answer due to disorganization and psychosis   Mental Health Symptoms Depression:  Irritability; Difficulty Concentrating   Duration of Depressive symptoms: Duration of Depressive Symptoms: Greater than two weeks   Mania:  -- (UTA, due to pt's rambling speech.)   Anxiety:   Irritability; Restlessness  Psychosis:  Grossly disorganized speech; Affective flattening/alogia/avolition   Duration of Psychotic symptoms: Duration of Psychotic Symptoms: Greater than six months   Trauma:  -- (UTA, due to pt's rambling speech.)   Obsessions:  -- (UTA, due to pt's rambling speech.)   Compulsions:  -- (UTA, due to pt's rambling speech.)   Inattention:  Disorganized   Hyperactivity/Impulsivity:  Feeling of restlessness   Oppositional/Defiant Behaviors:  Angry; Easily annoyed   Emotional Irregularity:  -- (UTA, due to pt's rambling speech.)   Other Mood/Personality Symptoms:  UTA, due to pt's rambling ongoing speech.    Mental Status Exam Appearance and self-care  Stature:  Average   Weight:  Average weight   Clothing:  Disheveled (Pt in hospital bed with his shirt off.)   Grooming:  Neglected   Cosmetic use:  None   Posture/gait:  Tense   Motor activity:  Restless;  Repetitive   Sensorium  Attention:  Confused   Concentration:  Scattered   Orientation:  -- (Pt unable to answer orientation questions due to disorganization and psychosis)   Recall/memory:  Defective in Immediate; Defective in Short-term   Affect and Mood  Affect:  Flat; Labile   Mood:  Anxious; Irritable   Relating  Eye contact:  Fleeting   Facial expression:  Anxious; Tense   Attitude toward examiner:  Cooperative; Dramatic   Thought and Language  Speech flow: Pressured; Flight of Ideas; Other (Comment) (Rambling.)   Thought content:  -- (UTA, due to pt's rambling speech.)   Preoccupation:  None   Hallucinations:  None   Organization:  Disorganized   Company secretary of Knowledge:  Poor   Intelligence:  Average   Abstraction:  Overly abstract   Judgement:  Impaired   Reality Testing:  Distorted   Insight:  Lacking   Decision Making:  Confused   Social Functioning  Social Maturity:  Impulsive   Social Judgement:  Heedless   Stress  Stressors:  Other (Comment) (UTA, due to pt's rambling speech.)   Coping Ability:  -- (UTA, due to pt's rambling speech.)   Skill Deficits:  Communication; Decision making; Self-control; Interpersonal   Supports:  -- (UTA, due to pt's rambling speech.)     Religion: Religion/Spirituality Are You A Religious Person?:  (UTA, due to pt's rambling speech.) How Might This Affect Treatment?: UTA, due to pt's rambling speech.  Leisure/Recreation: Leisure / Recreation Do You Have Hobbies?:  (UTA, due to pt's rambling speech.)  Exercise/Diet: Exercise/Diet Do You Exercise?:  (UTA, due to pt's rambling speech.) Have You Gained or Lost A Significant Amount of Weight in the Past Six Months?:  (UTA, due to pt's rambling speech.) Do You Follow a Special Diet?:  (UTA, due to pt's rambling speech.) Do You Have Any Trouble Sleeping?:  (UTA, due to pt's rambling speech.)   CCA Employment/Education Employment/Work  Situation: Employment / Work Situation Employment Situation: On disability Why is Patient on Disability: UTA, due to pt's rambling speech. How Long has Patient Been on Disability: UTA, due to pt's rambling speech. Patient's Job has Been Impacted by Current Illness:  (UTA, due to pt's rambling speech.) Has Patient ever Been in the Military?:  (UTA, due to pt's rambling speech.)  Education: Education Is Patient Currently Attending School?: No Last Grade Completed:  (UTA, due to pt's rambling speech.) Did You Attend College?:  (UTA, due to pt's rambling speech.) Did You Have An Individualized Education Program (IIEP):  (UTA, due to pt's rambling speech.) Did You  Have Any Difficulty At School?:  (UTA, due to pt's rambling speech.)   CCA Family/Childhood History Family and Relationship History: Family history Marital status: Single Does patient have children?: No  Childhood History:  Childhood History By whom was/is the patient raised?:  (UTA, due to pt's rambling speech.) Did patient suffer any verbal/emotional/physical/sexual abuse as a child?:  (UTA, due to pt's rambling speech.) Has patient ever been sexually abused/assaulted/raped as an adolescent or adult?:  (UTA, due to pt's rambling speech.) Witnessed domestic violence?:  (UTA, due to pt's rambling speech.) Has patient been affected by domestic violence as an adult?:  (UTA, due to pt's rambling speech.)       CCA Substance Use Alcohol/Drug Use: Alcohol / Drug Use Pain Medications: See MAR Prescriptions: See MAR Over the Counter: See MAR History of alcohol / drug use?: No history of alcohol / drug abuse Longest period of sobriety (when/how long): Pt denies substance use. Negative Consequences of Use:  (Pt denies substance use.) Withdrawal Symptoms:  (Pt denies substance use.)                         ASAM's:  Six Dimensions of Multidimensional Assessment  Dimension 1:  Acute Intoxication and/or Withdrawal  Potential:   Dimension 1:  Description of individual's past and current experiences of substance use and withdrawal: Pt denies substance use.  Dimension 2:  Biomedical Conditions and Complications:   Dimension 2:  Description of patient's biomedical conditions and  complications: Pt denies substance use.  Dimension 3:  Emotional, Behavioral, or Cognitive Conditions and Complications:  Dimension 3:  Description of emotional, behavioral, or cognitive conditions and complications: Pt denies substance use.  Dimension 4:  Readiness to Change:  Dimension 4:  Description of Readiness to Change criteria: Pt denies substance use.  Dimension 5:  Relapse, Continued use, or Continued Problem Potential:  Dimension 5:  Relapse, continued use, or continued problem potential critiera description: Pt denies substance use.  Dimension 6:  Recovery/Living Environment:  Dimension 6:  Recovery/Iiving environment criteria description: Pt denies substance use.  ASAM Severity Score:    ASAM Recommended Level of Treatment: ASAM Recommended Level of Treatment:  (Pt denies substance use.)   Substance use Disorder (SUD) Substance Use Disorder (SUD)  Checklist Symptoms of Substance Use:  (Pt denies substance use.)  Recommendations for Services/Supports/Treatments: Recommendations for Services/Supports/Treatments Recommendations For Services/Supports/Treatments: Inpatient Hospitalization  Disposition Recommendation per psychiatric provider: We recommend transfer to [facility].   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

## 2023-07-03 NOTE — Progress Notes (Addendum)
 Patient has been denied by [facility] due to no appropriate beds available. Patient meets BH inpatient criteria per Nickola Baron, NP. Patient has been faxed out to the following facilities:   [facility] - Request [address] [place] [phone]--LKGMW-NUUVOZDG [place] - Request [address] [place] [phone]--[facility]TKZ[facility] - Request Sent--[address] [place], [place] [phone]--[facility] Patient PlacementPending - Request [facility], [phone]--[facility] - Request Sent--[address] [place] [phone]--VPXTG-GYI [facility] HealthPending - Request Sent--[address] [place] [phone]--RCVEL-FYB Ellan Gunner - Request Sent--[address], [phone]--[facility] HealthPending - Request [address], [place] [phone]--[facility]QQP[facility] - Request Sent--[address] [place] [phone]--ZJQBH-ALPFXTK [facility] - Request [address] [place], [place] [phone]--NLGXQ-JJHERDEY [place]ending - Request [address] [place] [phone]--YDXAJ-OINOMVE [facility] HealthPending - Request [address] [place] [phone]--TWSFK[facility]CLEX [facility] - Request Sent--[address], [place] [phone]--DJTTS-VXBLTJQ [facility] Center[facility] - Request Sent--[address] [place] [facility] [phone]--[facility]  HealthcarePending - Request [address] [place] [phone]--  Phares Brasher, MSW, LCSW-A  11:18 PM 07/03/2023
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
## 2023-07-03 NOTE — ED Notes (Signed)
 Pt agitated and aggressive, refusing to change out or get vitals. MD notified, Orders placed.

## 2023-07-03 NOTE — ED Notes (Signed)
 Pt came in from [facility] with 3 copies of IVC papers already. Copies have been scanned in the chart and case # is in the chart also. IVC was done today 07/03/23 EXP. on 07/10/23.

## 2023-07-03 NOTE — Progress Notes (Signed)
   07/03/23 1446  [place] Suicide Severity Rating Scale  1. In the past month - "Have you wished you were dead or wished you could go to sleep and not wake up?" No  2. In the past month - "Have you actually had any thoughts of killing yourself?" No  6. Have you ever done anything, started to do anything, or prepared to do anything to end your life?" No  C-SSRS RISK CATEGORY No Risk
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
## 2023-07-03 NOTE — ED Notes (Signed)
 Pt refuses to allow NT to get temperature or to do an EKG. Upon request, pt jumped up and started yelling.

## 2023-07-03 NOTE — ED Notes (Signed)
 Patient was given a sandwich, juice, and chips.

## 2023-07-03 NOTE — Progress Notes (Signed)
 [facility] LCSW Progress Note   07/03/2023    11:51 PM  KYLIE GROS   [id number]   Type of Contact and Topic:  Psychiatric Bed Placement   Pt accepted to [facility]   Patient meets inpatient criteria per  Nickola Baron, NP  The attending provider will be Dr. Giacomo Kung  Call report to [phone]  Bethene Brought, RN @ [facility] ED notified.     Pt scheduled  to arrive at [facility] tomorrow (5/30) after 0900.    Phares Brasher, MSW, LCSW-A  11:53 PM 07/03/2023

## 2023-07-03 NOTE — ED Notes (Signed)
 Patient discharged with [employer] to Transport  to [facility] ED. Respirations equal and unlabored. Skin warm and dry No acute distress noted.

## 2023-07-03 NOTE — Discharge Instructions (Addendum)
 Transfer to [facility]

## 2023-07-03 NOTE — ED Notes (Signed)
 Called staffing to see if sitter available, no sitters available for this patient at this time.

## 2023-07-03 NOTE — ED Provider Notes (Signed)
 [place] EMERGENCY DEPARTMENT AT [facility] Provider Note   CSN: [id number] Arrival date & time: 07/03/23  2114     History  Chief Complaint  Patient presents with   Psychiatric Evaluation    [name] is a 43 y.o. male with medical history of schizophrenia.  The patient presents to the ED for psychiatric evaluation.  Patient was initially brought into the [facility] by [employer] under IVC.  Per triage note, the patient was brought in by [facility] from [facility] for acute psychosis and aggressive behavior.  The patient was involuntarily committed prior to arrival in the ED.  Patient was seen by psychiatric provider and determined that the patient requires inpatient psychiatric treatment.  The patient was sent to the ED because the [facility] is unable to accommodate the patient due to his aggressive status.  On my examination, the patient is garbling nonsensically.  He is alert and oriented x 4.  He denies SI, HI, AVH.  He denies EtOH or drug use tonight.  He denies medical complaints.  HPI     Home Medications Prior to Admission medications   Medication Sig Start Date End Date Taking? Authorizing Provider  amantadine  (SYMMETREL ) 100 MG capsule Take 100 mg by mouth 2 (two) times daily. Patient not taking: Reported on 10/25/2022 09/17/22   [provider]  benztropine  (COGENTIN ) 1 MG tablet Take 1 tablet (1 mg total) by mouth 2 (two) times daily. 10/08/22   Basilia Bosworth, DO  haloperidol  (HALDOL ) 10 MG tablet Take 1 tablet (10 mg total) by mouth 2 (two) times daily. 10/08/22   Pashayan, Knute Perla, DO  traZODone  (DESYREL ) 100 MG tablet Take 100 mg by mouth at bedtime.    [provider]      Allergies    Shellfish-derived products, Hydroxyzine , Mirtazapine , Olanzapine , Risperdal [risperidone], and Temazepam     Review of Systems   Review of Systems  All other systems reviewed and are negative.   Physical Exam Updated Vital Signs BP (!) 111/55   Pulse 97   Resp 14    Ht 5\' 11"  (1.803 m)   Wt 63.4 kg   SpO2 97%   BMI 19.49 kg/m  Physical Exam Vitals and nursing note reviewed.  Constitutional:      General: He is not in acute distress.    Appearance: He is well-developed.     Comments: Disheveled appearance  HENT:     Head: Normocephalic and atraumatic.  Eyes:     Conjunctiva/sclera: Conjunctivae normal.  Cardiovascular:     Rate and Rhythm: Normal rate and regular rhythm.     Heart sounds: No murmur heard. Pulmonary:     Effort: Pulmonary effort is normal. No respiratory distress.     Breath sounds: Normal breath sounds.  Abdominal:     Palpations: Abdomen is soft.     Tenderness: There is no abdominal tenderness.  Musculoskeletal:        General: No swelling.     Cervical back: Neck supple.  Skin:    General: Skin is warm and dry.     Capillary Refill: Capillary refill takes less than 2 seconds.  Neurological:     Mental Status: He is alert and oriented to person, place, and time. Mental status is at baseline.  Psychiatric:        Mood and Affect: Mood normal.     Comments: Seemingly responding to internal stimuli     ED Results / Procedures / Treatments  Labs (all labs ordered are listed, but only abnormal results are displayed) Labs Reviewed  CBC - Abnormal; Notable for the following components:      Result Value   Hemoglobin 11.3 (*)    HCT 33.1 (*)    MCV 73.2 (*)    MCH 25.0 (*)    RDW 15.9 (*)    Platelets 425 (*)    All other components within normal limits  COMPREHENSIVE METABOLIC PANEL WITH GFR - Abnormal; Notable for the following components:   Potassium 3.0 (*)    Glucose, Bld 162 (*)    Albumin 3.2 (*)    All other components within normal limits  SALICYLATE LEVEL - Abnormal; Notable for the following components:   Salicylate Lvl <7.0 (*)    All other components within normal limits  ACETAMINOPHEN  LEVEL - Abnormal; Notable for the following components:   Acetaminophen  (Tylenol ), Serum <10 (*)    All  other components within normal limits  ETHANOL  URINALYSIS, ROUTINE W REFLEX MICROSCOPIC  RAPID URINE DRUG SCREEN, HOSP PERFORMED    EKG None  Radiology No results found.  Procedures Procedures    Medications Ordered in ED Medications  potassium chloride  SA (KLOR-CON  M) CR tablet 60 mEq (has no administration in time range)  ziprasidone  (GEODON ) injection 20 mg (20 mg Intramuscular Given 07/03/23 2157)  LORazepam  (ATIVAN ) injection 2 mg (2 mg Intramuscular Given 07/03/23 2157)    ED Course/ Medical Decision Making/ A&P  Medical Decision Making Risk Prescription drug management.   43 year old male presents for evaluation.  Please see HPI for further details.  On examination the patient is afebrile and nontachycardic.  His lung sounds are clear bilaterally, he is not epoxy.  Abdomen soft and compressible.  Neurological examination at baseline.  Overall nontoxic appearance.  Patient was sent over from the [facility] as they were unable to accommodate this patient.  He has already been IVCd, already had first examination paperwork filled out.  Will collect screening labs.  Patient apparently has already been deemed in need of inpatient psychiatric care.  CBC without leukocytosis, baseline hemoglobin 11.3.  Metabolic panel shows potassium 3 repleted with 60 mEq oral potassium.  Glucose 162.  Ethanol, acetaminophen , salicylate undetectable.  EKG shows sinus rhythm.  At this time, patient is medically stable for inpatient psychiatric placement.  Patient has been moved to the secure purple zone.  Stable at this time.  Final Clinical Impression(s) / ED Diagnoses Final diagnoses:  Psychosis, unspecified psychosis type [facility])  Encounter for psychiatric assessment    Rx / DC Orders ED Discharge Orders     None         Adel Aden, PA-C 07/03/23 2358    Lowery Rue, DO 07/04/23 1759

## 2023-07-03 NOTE — ED Triage Notes (Signed)
 Pt BIB [employer] from [facility] for acute psychosis and aggressive behavior. Pt IVC'ed.

## 2023-07-04 NOTE — Progress Notes (Signed)
 Pt has been accepted to [facility] TODAY 07/04/2023, Bed assignment: Main campus  Pt meets inpatient criteria per: [name]  Attending Physician will be Lavona Pounds, MD  Report can be called to: [phone] (this is a pager, please leave call-back number when giving report)  Pt can arrive ASAP   Care Team Notified: Heyward Loud NP, Merlene Stark RN  [place] Amily Depp LCSW-A   07/04/2023 9:31 AM

## 2023-07-04 NOTE — ED Provider Notes (Signed)
  Physical Exam  BP 112/78   Pulse 86   Temp 97.9 F (36.6 C) (Oral)   Resp 18   Ht 5\' 11"  (1.803 m)   Wt 63.4 kg   SpO2 98%   BMI 19.49 kg/m   Physical Exam  Procedures  Procedures  ED Course / MDM    Medical Decision Making Amount and/or Complexity of Data Reviewed Labs: ordered.  Risk Prescription drug management.   Patient pending transfer to [facility], Dr. Ronny Colas excepting.       Mozell Arias, MD 07/04/23 [phone]

## 2023-07-04 NOTE — ED Notes (Signed)
 IVC'd 07/03/23, exp 07/10/23

## 2023-10-10 IMAGING — CR DG CHEST 1V
1 series · 1 of 1 positions shown · non-contrast
Comparison: 11/28/2020

CLINICAL DATA: Evaluate shunt

EXAM:
CHEST  1 VIEW

[chest pa]
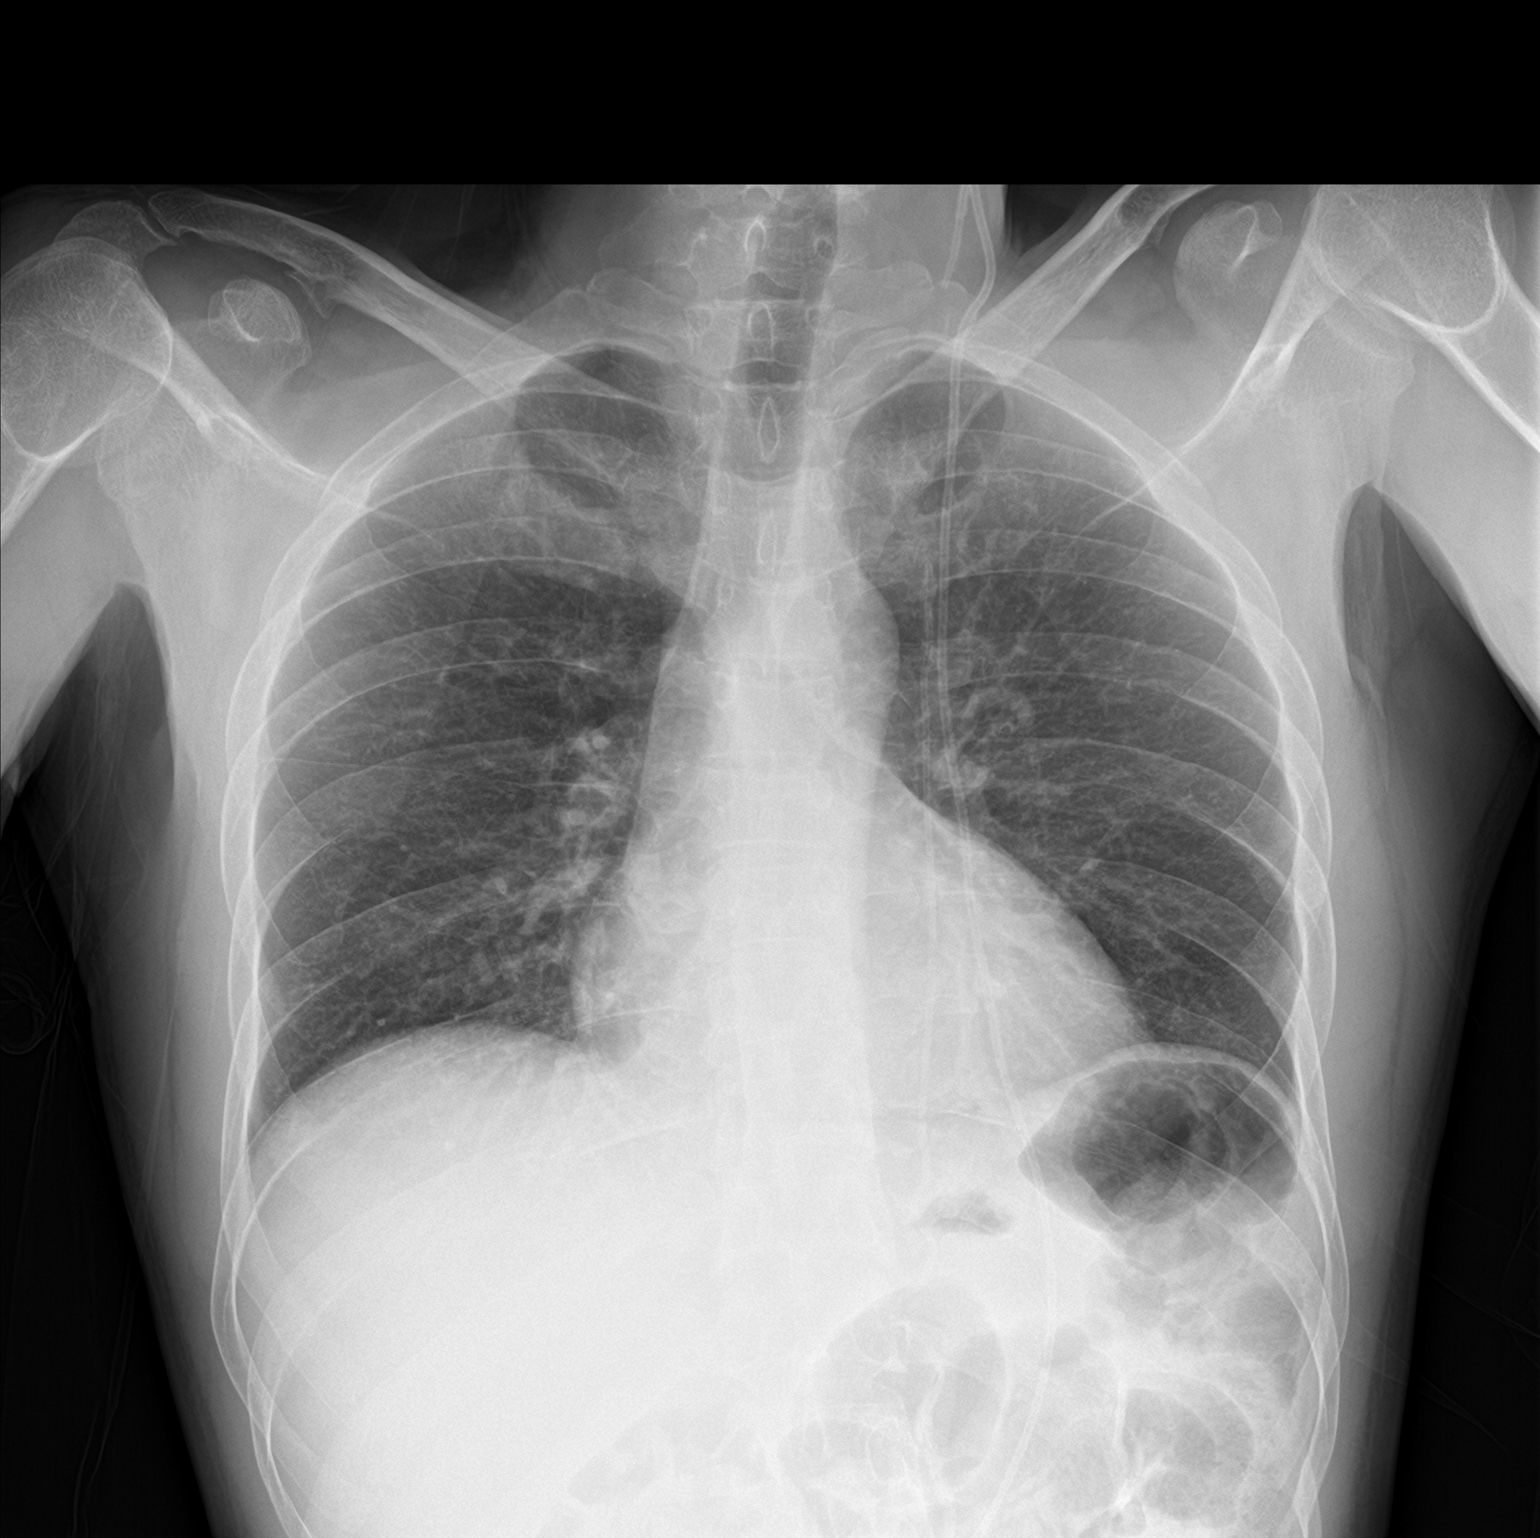

[1 of 1 positions shown; findings below may reference images not displayed]

FINDINGS: VP shunt catheter seen descending the left chest extending into the
abdomen. No evidence of catheter kinking or discontinuity. Abandoned
shunt tubing is again seen. Normal heart size. No focal airspace
consolidation, pleural effusion, or pneumothorax. The heart size and
mediastinal contours are within normal limits. Both lungs are clear.
The visualized skeletal structures are unremarkable.
IMPRESSION: VP shunt catheter appears intact without evidence of kinking or
discontinuity within the chest.

## 2023-10-10 IMAGING — CT CT HEAD W/O CM
3 of 4 series · 15 of 47 positions shown, 18 images · non-contrast
Comparison: 11/28/2020

CLINICAL DATA: Hydrocephalus, shunted, follow up



[Series 3: head 5.0 h30s · axial · 0.42mm/px · z∈[-75,+30]mm · 9 of 27 slices shown, 12 images]
[im 3/27  brain]
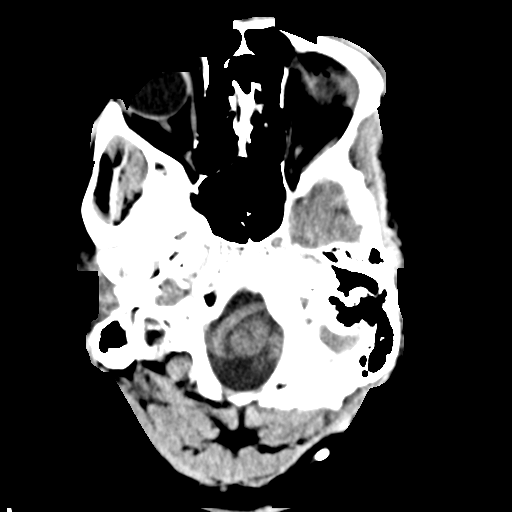
[im 3/27  bone]
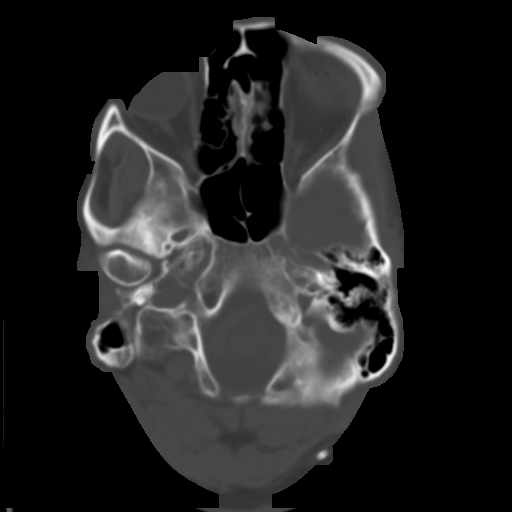
[im 6/27  brain]
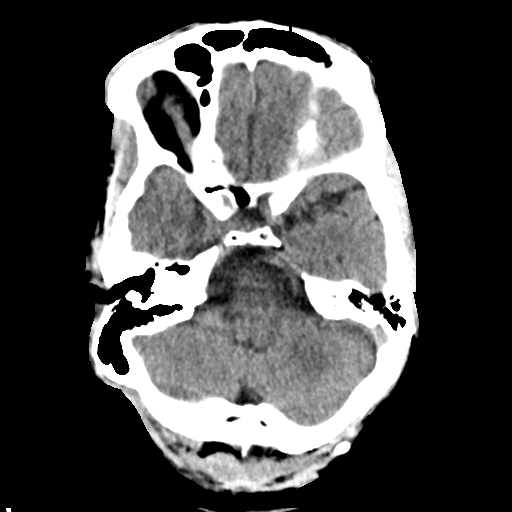
[im 8/27  brain]
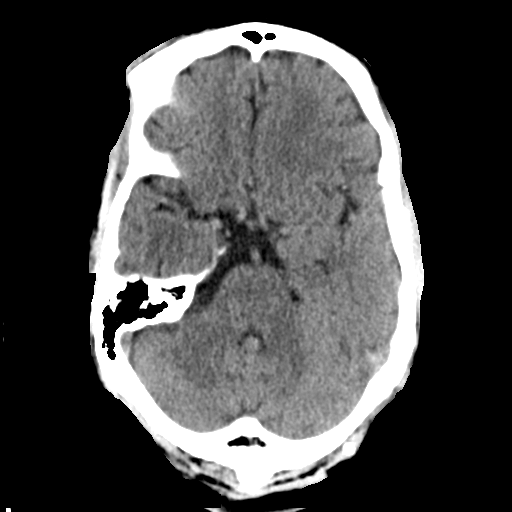
[im 11/27  brain]
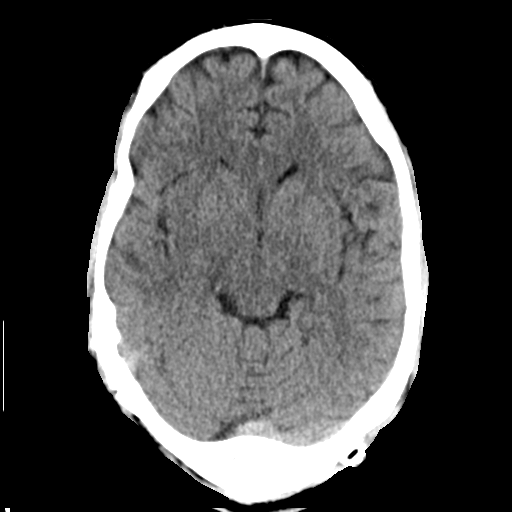
[im 14/27  brain]
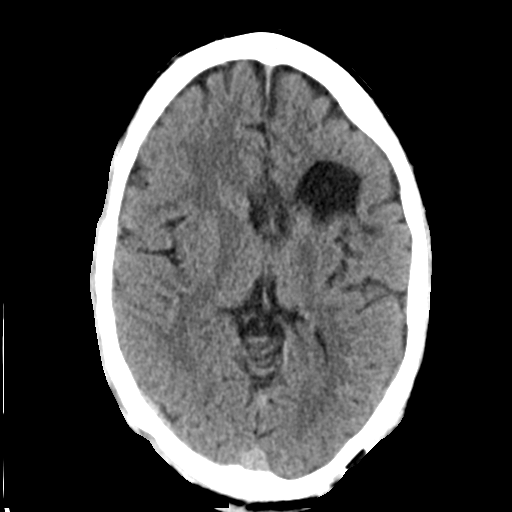
[im 14/27  bone]
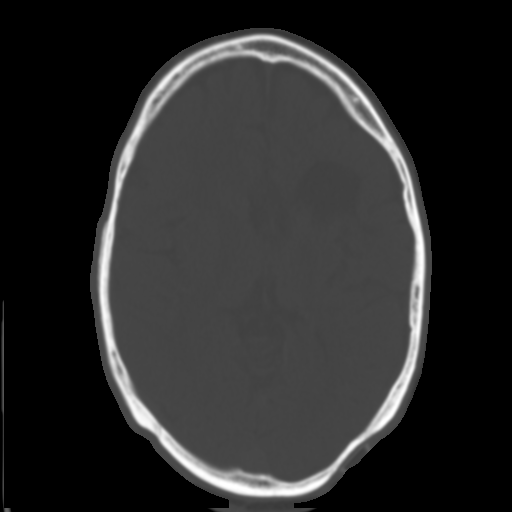
[im 16/27  brain]
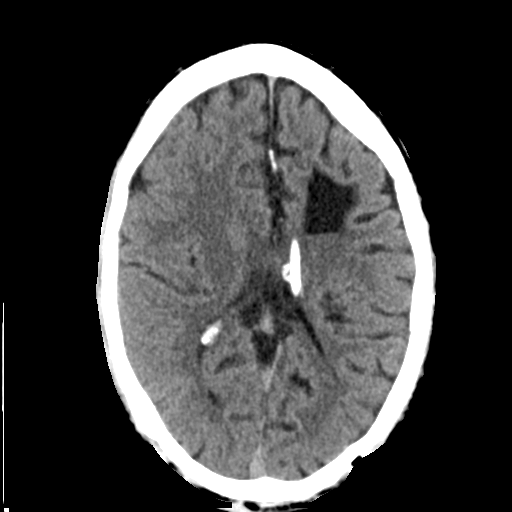
[im 19/27  brain]
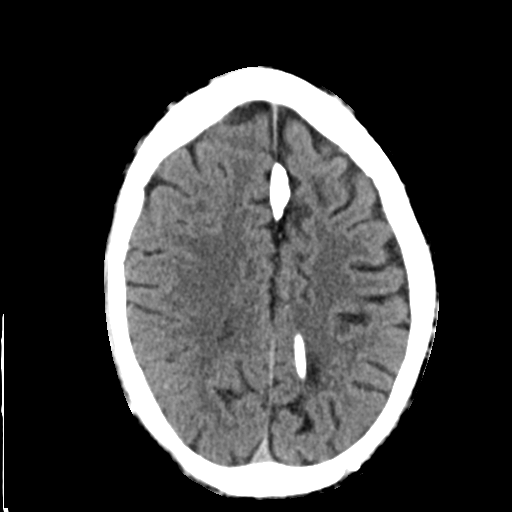
[im 21/27  brain]
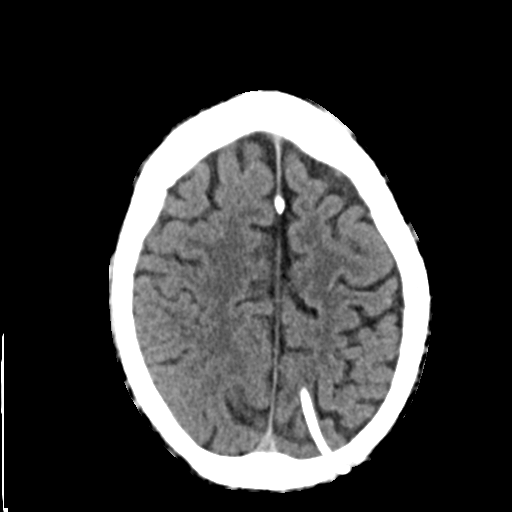
[im 24/27  brain]
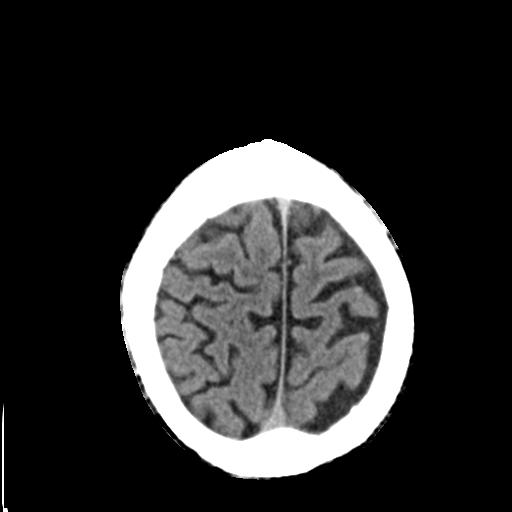
[im 24/27  bone]
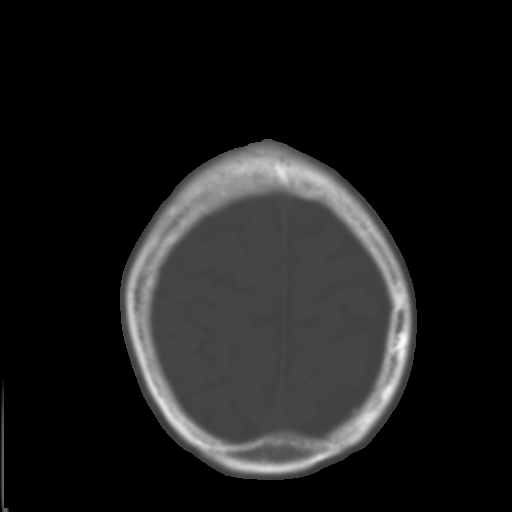

[Series 5: head 3.0 mpr cor · coronal · 0.31mm/px · 3 of 71 slices shown]
[im 24/71  brain]
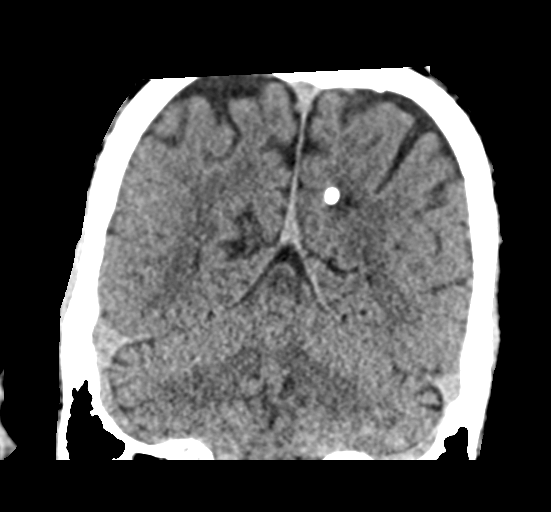
[im 32/71  brain]
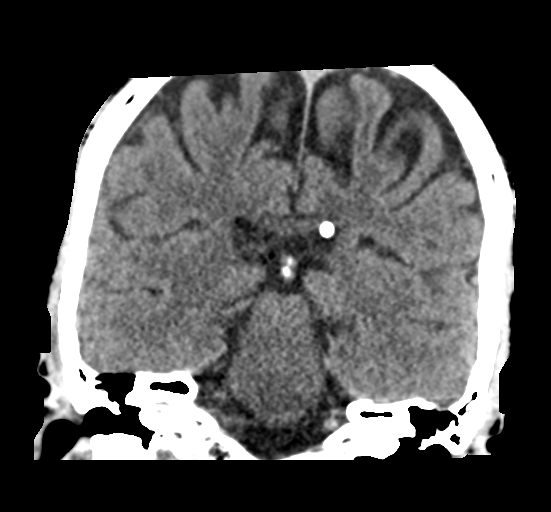
[im 39/71  brain]
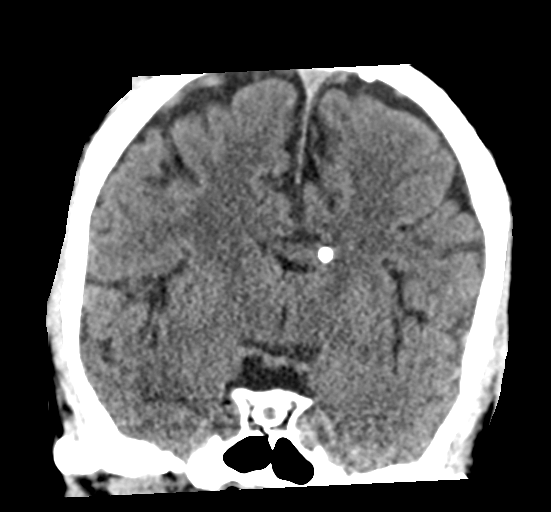

[Series 6: head 3.0 mpr sag · sagittal · 0.28mm/px · 3 of 55 slices shown]
[im 19/55  brain]
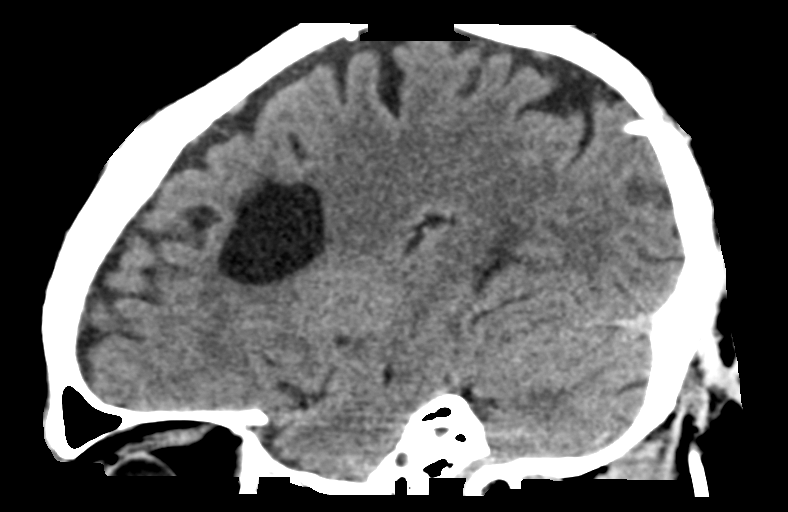
[im 28/55  brain]
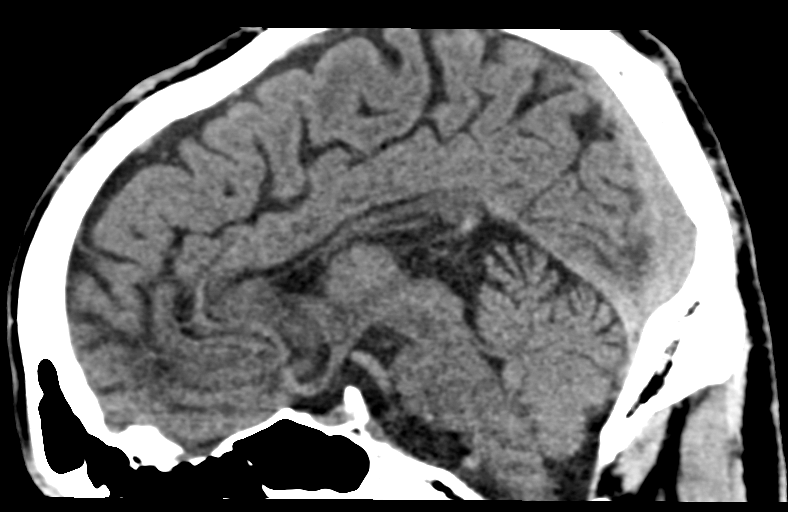
[im 37/55  brain]
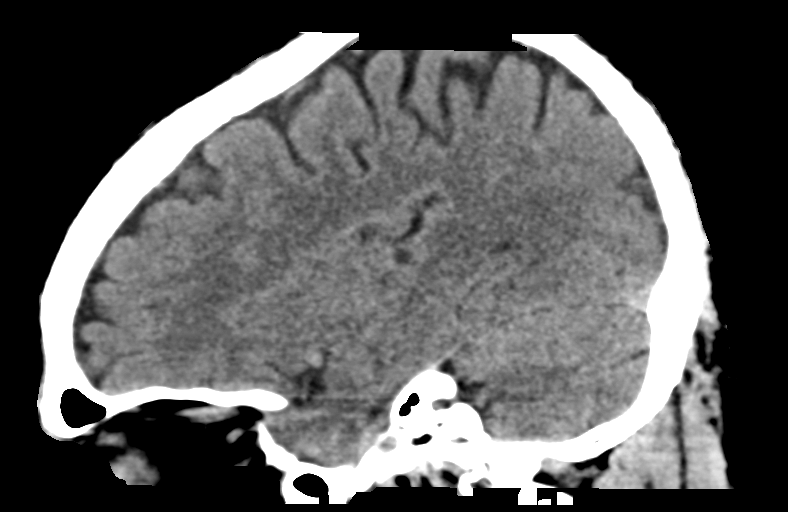

[15 of 47 positions shown; findings below may reference images not displayed]

FINDINGS: Brain: Left posterior parietal ventricular shunt in place.
Ventricles are decompressed. Porencephalic cyst in the left frontal
lobe again noted, stable. No hemorrhage or acute infarct.

Vascular: No hyperdense vessel or unexpected calcification.

Skull: No acute calvarial abnormality.

Sinuses/Orbits: No acute findings

Other: None
IMPRESSION: Left VP shunt remains in place, unchanged.  No hydrocephalus.

No acute intracranial abnormality.

## 2023-10-10 IMAGING — CR DG SKULL 1-3V
2 series · 2 of 2 positions shown · non-contrast
Comparison: 11/28/2020

CLINICAL DATA: Evaluate for shunt malfunction

EXAM:
SKULL - 1-3 VIEW

[skull calldwell]
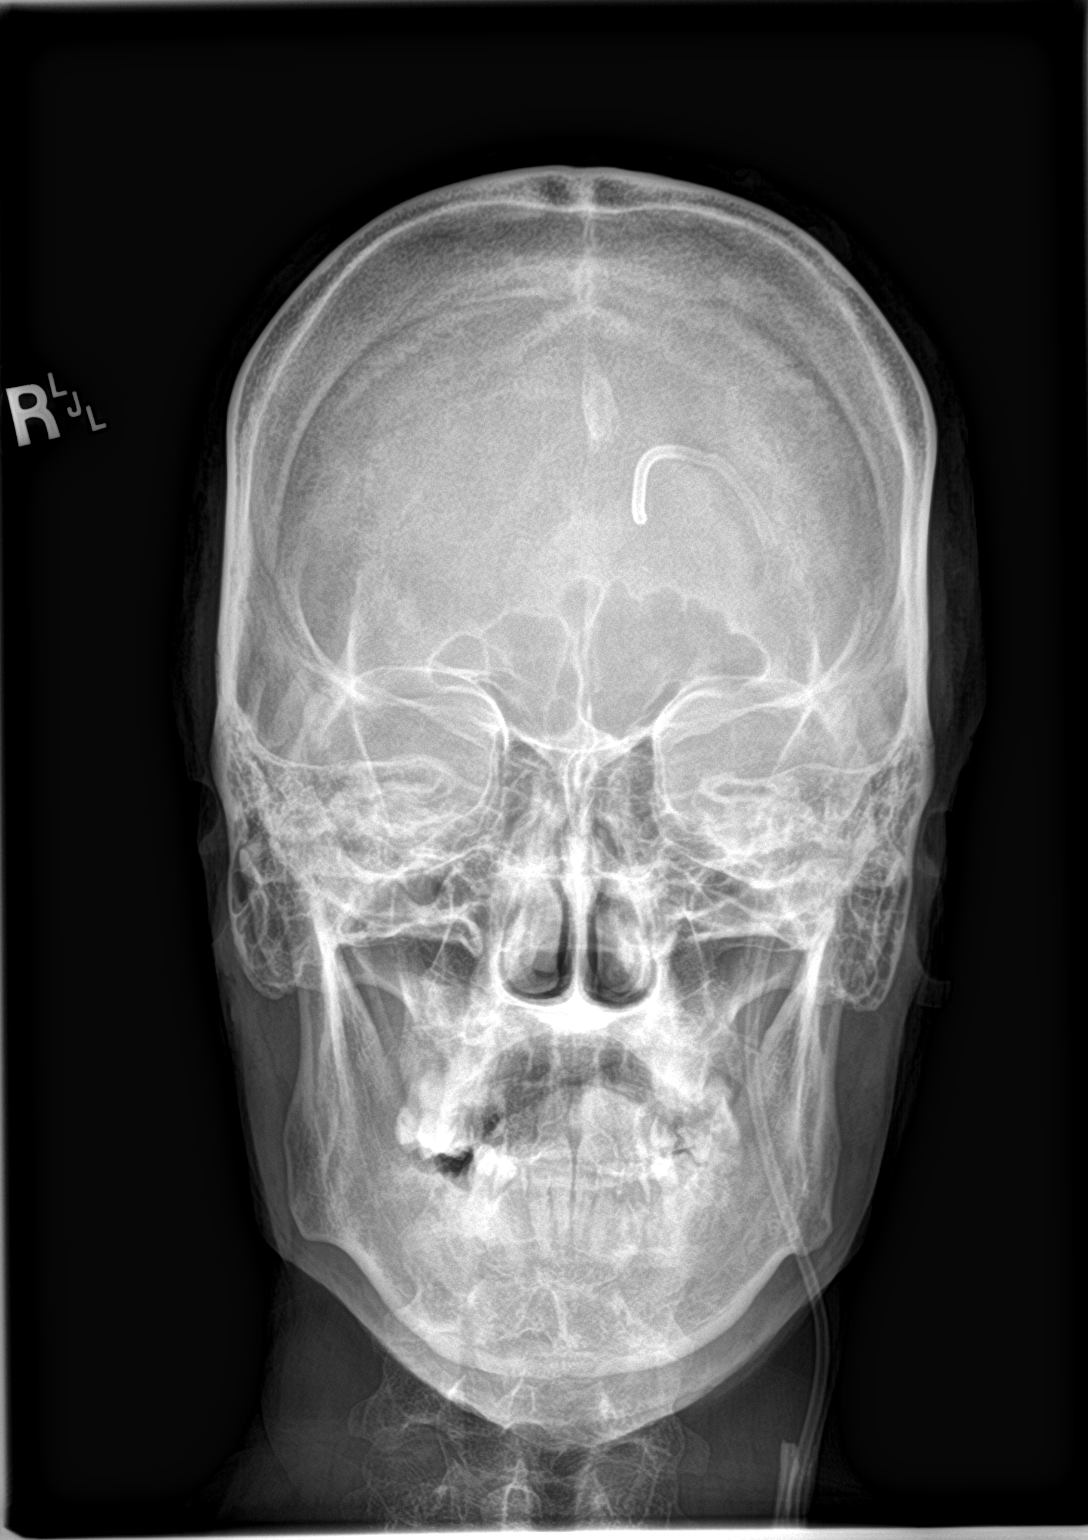

[skull lat]
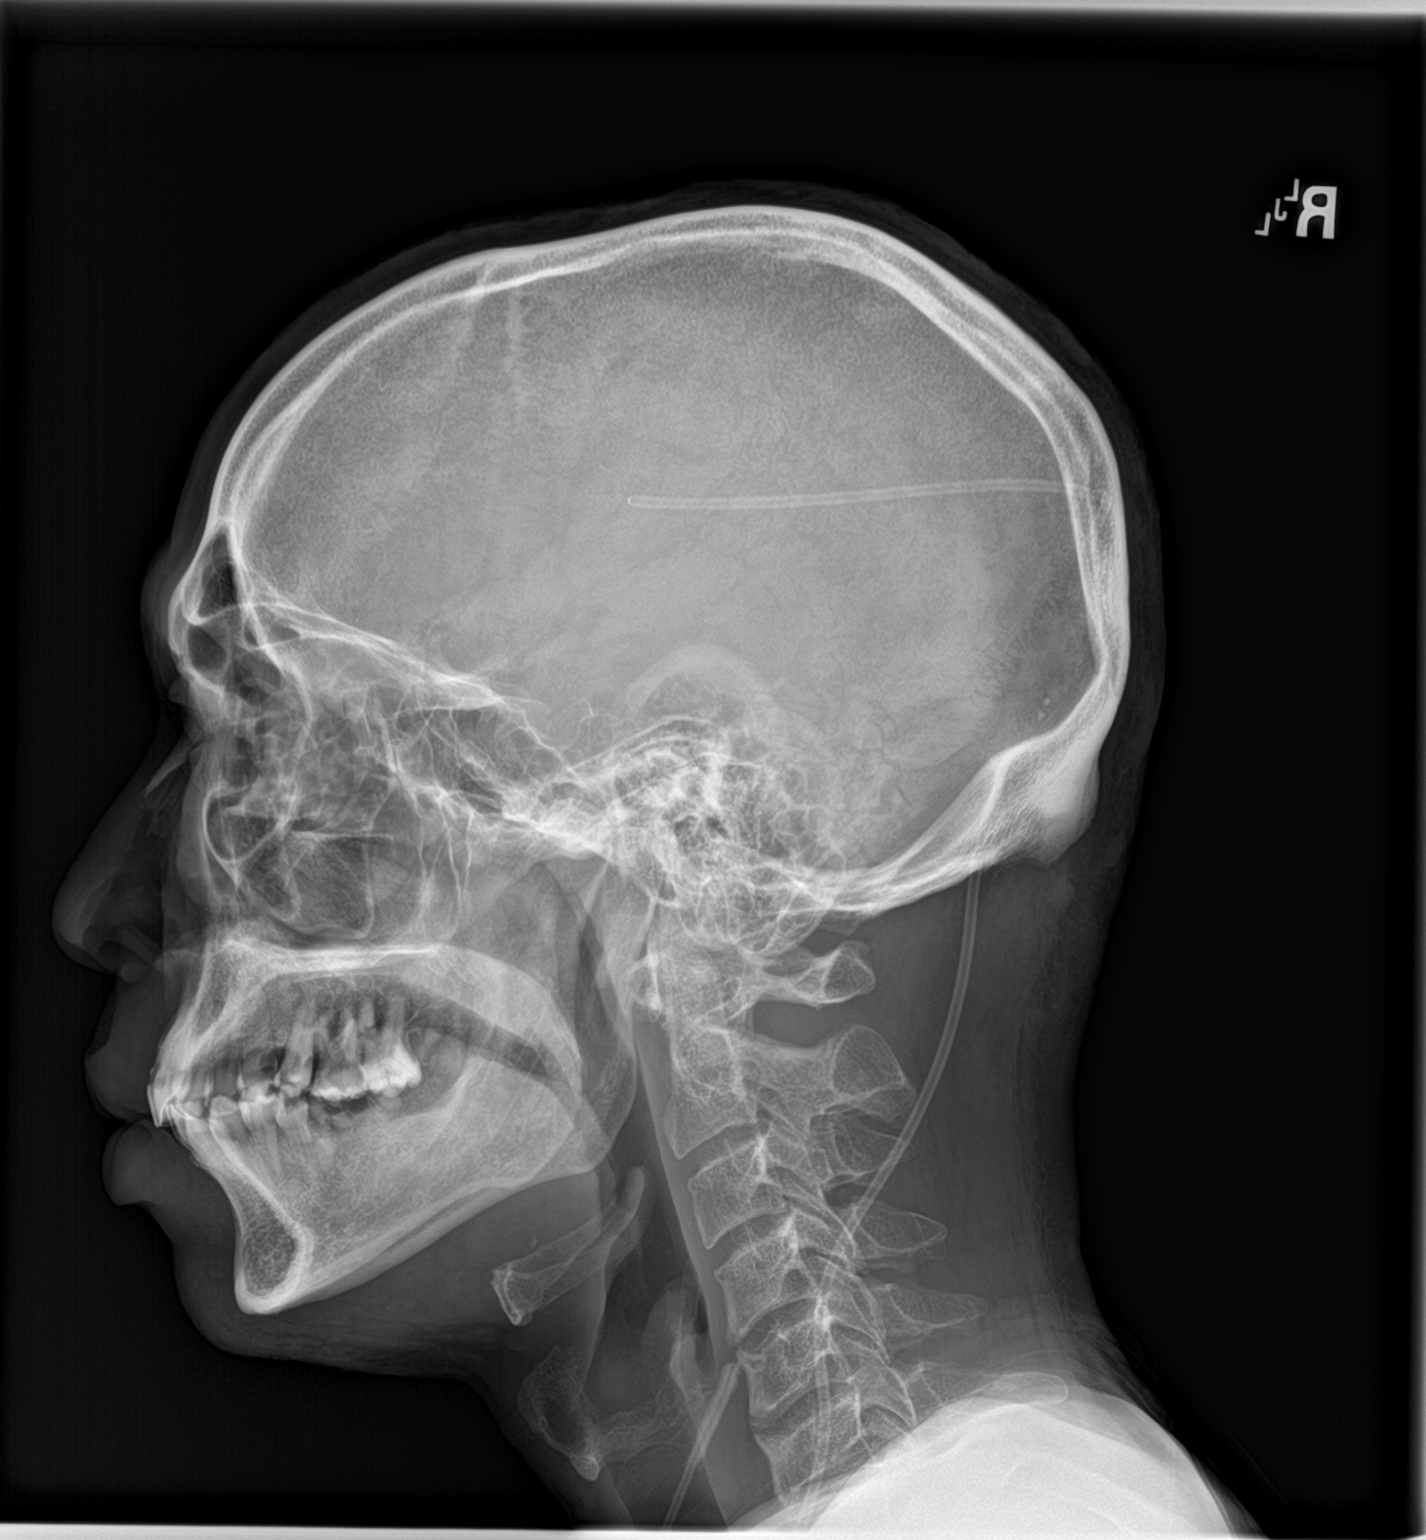

[2 of 2 positions shown; findings below may reference images not displayed]

FINDINGS: There is no evidence of skull fracture or other focal bone lesions.
Left posterior ventriculoperitoneal shunt in place. Visualized
tubing appears intact.
IMPRESSION: Negative.

## 2023-10-10 IMAGING — CR DG CERVICAL SPINE 1V
1 series · 1 of 1 positions shown · non-contrast
Comparison: X-ray cervical spine 11/28/2020

CLINICAL DATA: Evaluate for shunt malfunction

EXAM:
DG CERVICAL SPINE - 1 VIEW

[c-spine ap]
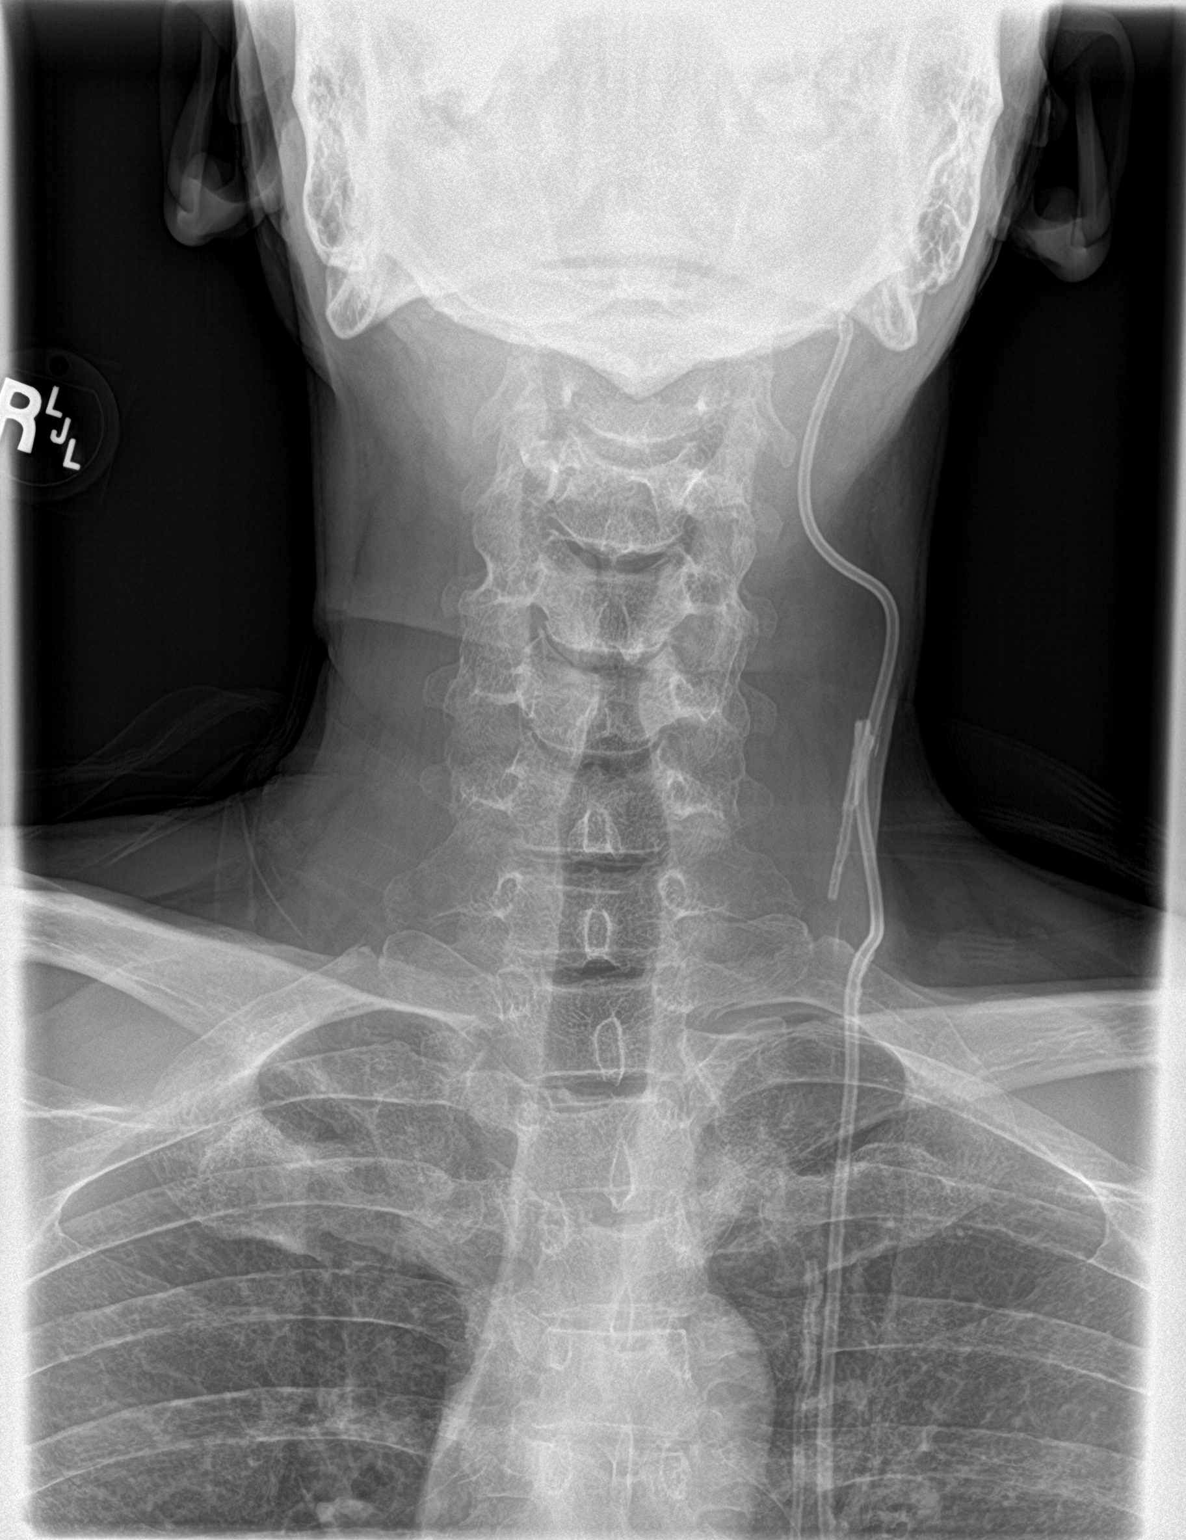

[1 of 1 positions shown; findings below may reference images not displayed]

FINDINGS: Similar-appearing left ventriculoperitoneal shunt overlying the left
neck and upper chest with no definite kinking or discontinuity.
Separate parallel abandoned discontinuous shunt again noted.

Multilevel uncovertebral facet degenerative changes.
IMPRESSION: Similar-appearing left ventriculoperitoneal shunt overlying the left
neck and upper chest with no definite kinking or discontinuity.
Separate parallel abandoned discontinuous shunt again noted.

## 2023-10-15 ENCOUNTER — Emergency Department (HOSPITAL_COMMUNITY)
Admission: EM | Admit: 2023-10-15 | Discharge: 2023-10-16 | Disposition: A | Discharge status: Other Acute Inpatient Hospital | Payer: MEDICAID | Attending: Emergency Medicine | Admitting: Emergency Medicine

## 2023-10-15 ENCOUNTER — Encounter (HOSPITAL_COMMUNITY): Payer: Self-pay | Admitting: Emergency Medicine

## 2023-10-15 ENCOUNTER — Other Ambulatory Visit: Payer: Self-pay

## 2023-10-15 DIAGNOSIS — F200 Paranoid schizophrenia: Secondary | ICD-10-CM | POA: Diagnosis present

## 2023-10-15 DIAGNOSIS — F259 Schizoaffective disorder, unspecified: Secondary | ICD-10-CM

## 2023-10-15 LAB — URINE DRUG SCREEN
Amphetamines: NEGATIVE
Barbiturates: NEGATIVE
Benzodiazepines: NEGATIVE
Cocaine: NEGATIVE
Fentanyl: NEGATIVE
Methadone Scn, Ur: NEGATIVE
Opiates: NEGATIVE
Tetrahydrocannabinol: NEGATIVE

## 2023-10-15 LAB — CBC
HCT: 39.6 % (ref 39.0–52.0)
Hemoglobin: 13.2 g/dL (ref 13.0–17.0)
MCH: 25 pg — ABNORMAL LOW (ref 26.0–34.0)
MCHC: 33.3 g/dL (ref 30.0–36.0)
MCV: 75 fL — ABNORMAL LOW (ref 80.0–100.0)
Platelets: 423 K/uL — ABNORMAL HIGH (ref 150–400)
RBC: 5.28 MIL/uL (ref 4.22–5.81)
RDW: 14.7 % (ref 11.5–15.5)
WBC: 11.7 K/uL — ABNORMAL HIGH (ref 4.0–10.5)
nRBC: 0 % (ref 0.0–0.2)

## 2023-10-15 LAB — COMPREHENSIVE METABOLIC PANEL WITH GFR
ALT: 8 U/L (ref 0–44)
AST: 20 U/L (ref 15–41)
Albumin: 4.2 g/dL (ref 3.5–5.0)
Alkaline Phosphatase: 81 U/L (ref 38–126)
Anion gap: 16 — ABNORMAL HIGH (ref 5–15)
BUN: 7 mg/dL (ref 6–20)
CO2: 23 mmol/L (ref 22–32)
Calcium: 9.4 mg/dL (ref 8.9–10.3)
Chloride: 97 mmol/L — ABNORMAL LOW (ref 98–111)
Creatinine, Ser: 0.88 mg/dL (ref 0.61–1.24)
GFR, Estimated: >60 mL/min (ref >60)
Glucose, Bld: 102 mg/dL — ABNORMAL HIGH (ref 70–99)
Potassium: 3.2 mmol/L — ABNORMAL LOW (ref 3.5–5.1)
Sodium: 136 mmol/L (ref 135–145)
Total Bilirubin: 0.8 mg/dL (ref 0.0–1.2)
Total Protein: 8.1 g/dL (ref 6.5–8.1)

## 2023-10-15 LAB — VALPROIC ACID LEVEL: Valproic Acid Lvl: <10 ug/mL — ABNORMAL LOW (ref 50–100)

## 2023-10-15 LAB — ETHANOL: Alcohol, Ethyl (B): <15 mg/dL (ref <15)

## 2023-10-15 MED ORDER — HALOPERIDOL LACTATE 5 MG/ML IJ SOLN
5.0000 mg | Freq: Once | INTRAMUSCULAR | Status: AC
  Administered 2023-10-15: 5 mg via INTRAMUSCULAR
  Filled 2023-10-15: qty 1

## 2023-10-15 MED ORDER — DIVALPROEX SODIUM ER 500 MG PO TB24
500.0000 mg | ORAL_TABLET | Freq: Every day | ORAL | Status: DC
  Administered 2023-10-15: 500 mg via ORAL
  Filled 2023-10-15: qty 1

## 2023-10-15 MED ORDER — HALOPERIDOL 5 MG PO TABS
5.0000 mg | ORAL_TABLET | Freq: Two times a day (BID) | ORAL | Status: DC
  Administered 2023-10-15 – 2023-10-16 (×2): 5 mg via ORAL
  Filled 2023-10-15 (×2): qty 1

## 2023-10-15 MED ORDER — POTASSIUM CHLORIDE CRYS ER 20 MEQ PO TBCR
40.0000 meq | EXTENDED_RELEASE_TABLET | Freq: Once | ORAL | Status: AC
  Administered 2023-10-15: 40 meq via ORAL
  Filled 2023-10-15: qty 2

## 2023-10-15 NOTE — ED Notes (Signed)
 Pt expressed not wanting to see his dad. Pt became agitated once dad mentioned pt's diagnoses. Pt's dad asked to leave at this time.

## 2023-10-15 NOTE — ED Notes (Signed)
 PT walked to [facility] with a steady gait, with security and RN. NAD noted. Pt noted to still have on street clothes, which per security pt is able to remain in them.

## 2023-10-15 NOTE — ED Notes (Signed)
 [facility] called pts father Kebin Maye for collateral information. Per pts father pt has been noncompliant with oral medications for a minimum of 90 days. Pt did have an injection in June but has been decompensating in the last couple of months. Pt lives with his father who helps to monitor his care. Per pts father, pt does not consume any illicit drugs or alcohol but does smoke cigarettes and cigars.  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 Pts father would like pt to be scanned to ensure proper functioning of the shunt to check that it is not causing difficulties for pt. Pts father reports that pt does not have any additional medical issues. Pts father was informed that pt has been recommended for inpatient admission. Pts father would like to be informed where pt gets admitted to.   Chesley Holt, [facility]  10/15/23

## 2023-10-15 NOTE — ED Notes (Signed)
 [facility] called pts father to inform him that pt has been accepted to [facility] and will be transferred there after 8am tomorrow. [facility] recommended to pts father that he reconnect with pts former [facility] at [place] to assist with medication compliance and with pts care. Pts father was appreciative of the suggestion.  Chesley Holt, [facility]  10/15/23

## 2023-10-15 NOTE — ED Notes (Signed)
   Pt has been accepted to [facility] TOMORROW 10/16/2023  Main campus   Pt meets inpatient criteria per: Efrain Patient NP   Attending Physician will be Millie Manners, MD   Report can be called to: [phone] (this is a pager, please leave call-back number when giving report)   Pt can arrive after 8 AM   Care Team Notified: Efrain Patient NP, Chesley Holt [facility],    [place] LCSW-A    10/15/2023 12:30 PM

## 2023-10-15 NOTE — ED Notes (Signed)
 Patient attempting to leave the facility. Security called. Patient getting IVCd if tries to leave

## 2023-10-15 NOTE — Consult Note (Signed)
 [facility] Psychiatric Consult Initial  Patient Name: .[name]  MRN: [id number]  DOB: [birth year]  Consult Order details:  Orders (From admission, onward)     Start     Ordered   10/15/23 0323  CONSULT TO CALL ACT TEAM       Ordering Provider: Jarold Olam HERO, PA-C  Provider:  (Not yet assigned)  Question:  Reason for Consult?  Answer:  Psych consult   10/15/23 0322             Mode of Visit: In person    Psychiatry Consult Evaluation  Service Date: October 15, 2023 LOS:  LOS: 0 days  Chief Complaint Give me juice  Primary Psychiatric Diagnoses  Paranoid Schizophrenia   Assessment  [name] is a 43 y.o. male admitted: Presented to the EDfor 10/15/2023  2:09 AM for brought in by EMS from [facility]. He carries the psychiatric diagnoses of paranoid schizophrenia and has a past medical history of  dyslipidemia, CAD and tardive dyskinesia.   His current presentation of quivering, mumbling and being unable to communicate is most consistent with decompensated paranoid schizophrenia. He meets criteria for inpatient psychiatric hospitalization based on acute psychosis.  Current outpatient psychotropic medications include haldol  and depakote  and historically he has had a unknown response to these medications. He was not compliant with medications prior to admission as evidenced by patient report. On initial examination, patient is quivering, looking scared and mumbling to unseen others. Please see plan below for detailed recommendations.   Diagnoses:  Active Hospital problems: Principal Problem:   Paranoid schizophrenia (HCC)    Plan   ## Psychiatric Medication Recommendations:  Start  --Haldol  5mg  PO BID --Depakote  ER 500mg  PO Q HS  ## Medical Decision Making Capacity: Not specifically addressed in this encounter  ## Further Work-up:  -- order a valproic acid  level -- most recent EKG on 10/15/23 had QtC of 482 -- Pertinent labwork reviewed earlier this  admission includes: CBC, CMP and alcohol   ## Disposition:-- We recommend inpatient psychiatric hospitalization after medical hospitalization. Patient has been involuntarily committed on 10/15/23.   ## Behavioral / Environmental: - No specific recommendations at this time.     ## Safety and Observation Level:  - Based on my clinical evaluation, I estimate the patient to be at low risk of self harm in the current setting. - At this time, we recommend  routine. This decision is based on my review of the chart including patient's history and current presentation, interview of the patient, mental status examination, and consideration of suicide risk including evaluating suicidal ideation, plan, intent, suicidal or self-harm behaviors, risk factors, and protective factors. This judgment is based on our ability to directly address suicide risk, implement suicide prevention strategies, and develop a safety plan while the patient is in the clinical setting. Please contact our team if there is a concern that risk level has changed.  CSSR Risk Category:C-SSRS RISK CATEGORY: No Risk  Suicide Risk Assessment: Patient has following modifiable risk factors for suicide: medication noncompliance and active mental illness (to encompass adhd, tbi, mania, psychosis, trauma reaction), which we are addressing by recommending inpatient psychiatric hospitalization. Patient has following non-modifiable or demographic risk factors for suicide: male gender and psychiatric hospitalization Patient has the following protective factors against suicide: Access to outpatient mental health care and Supportive family  Thank you for this consult request. Recommendations have been communicated to the primary team.  We will continue to follow at this  time.   Jeily Guthridge A Toshika Parrow, NP       History of Present Illness  Relevant Aspects of Hospital ED Course:  Admitted on 10/15/2023 for brought in by EMS from [facility]. He carries the  psychiatric diagnoses of paranoid schizophrenia and has a past medical history of  dyslipidemia, CAD and tardive dyskinesia.    Patient Report:  [name], is seen face to face by this provider, consulted with Dr. Larina; and chart reviewed on 10/15/23.  On evaluation [name] reports give me juice.  Otherwise, he spends the entire assessment quivering, mumbling to unseen others, appearing scared and unable to communicate well.  During evaluation [name] is laying in bed in distress.  He is alert & oriented to self. He appears scared and nervous. His mood is anxious with congruent affect.  He is mumbling to unseen others and has bizarre behavior.  Objectively there is evidence of psychosis/mania and delusional thinking. Pt does appear to be responding to internal and external stimuli.  Patient is unable to converse coherently.  He does not verbally respond regarding suicidal/self-harm/homicidal ideation, psychosis, and paranoia.  Patient was unable to answer questions appropriately.    I personally spent a total of 60 minutes in the care of the patient today including preparing to see the patient, performing a medically appropriate exam/evaluation, counseling and educating, placing orders, referring and communicating with other health care professionals, documenting clinical information in the EHR, independently interpreting results, and coordinating care.   Psych ROS:  Depression: UTA Anxiety:  UTA Mania (lifetime and current): UTA Psychosis: (lifetime and current): current   Review of Systems  Psychiatric/Behavioral:  Positive for hallucinations. The patient is nervous/anxious.   All other systems reviewed and are negative.    Psychiatric and Social History  Psychiatric History:  Information collected from chart review  Prev Dx/Sx: paranoid schizophrenia Current Psych Provider: UTA Home Meds (current): haldol  and depakote  Previous Med Trials: unknown Therapy:  UTA  Prior Psych Hospitalization: yes  Prior Self Harm: UTA Prior Violence: UTA  Family Psych History: UTA Family Hx suicide: UTA  Social History:  Developmental Hx: UTA Educational Hx: UTA Occupational Hx: Disabled Armed forces operational officer Hx: UTA Living Situation: Lives with Father Spiritual Hx: UTA Access to weapons/lethal means: UTA   Substance History Alcohol: No history noted  Tobacco: No history noted  Illicit drugs: No history noted  Prescription drug abuse: No history noted  Rehab hx: No history noted   Exam Findings  Physical Exam:  Vital Signs:  Temp:  [97.7 F (36.5 C)] 97.7 F (36.5 C) (09/10 0211) Pulse Rate:  [110] 110 (09/10 0211) Resp:  [16] 16 (09/10 0211) BP: (119)/(92) 119/92 (09/10 0211) SpO2:  [99 %] 99 % (09/10 0239) Weight:  [65.8 kg] 65.8 kg (09/10 0215) Blood pressure (!) 119/92, pulse (!) 110, temperature 97.7 F (36.5 C), resp. rate 16, height 5' 11 (1.803 m), weight 65.8 kg, SpO2 99%. Body mass index is 20.22 kg/m.  Physical Exam Vitals and nursing note reviewed.  Eyes:     Pupils: Pupils are equal, round, and reactive to light.  Pulmonary:     Effort: Pulmonary effort is normal.  Skin:    General: Skin is dry.  Neurological:     Mental Status: He is alert.  Psychiatric:        Mood and Affect: Mood is anxious.        Speech: He is noncommunicative.        Thought Content:  Thought content is paranoid.     Mental Status Exam: General Appearance: Disheveled  Orientation:  Other:  UTA other than oriented to self  Memory:  UTA  Concentration:  UTA  Recall:  UTA  Attention  Other: UTA  Eye Contact:  Minimal  Speech:  mumbling to unseen others  Language:  UTA  Volume:  Decreased  Mood: anxious  Affect:  Congruent  Thought Process:  Disorganized  Thought Content:  UTA  Suicidal Thoughts:  UTA  Homicidal Thoughts:  UTA  Judgement:  Other:  UTA  Insight:  UTA  Psychomotor Activity:  Restlessness  Akathisia:  No  Fund of Knowledge:  UTA       Assets:  Leisure Time Social Support  Cognition:  UTA  ADL's:  Intact  AIMS (if indicated):        Other History   These have been pulled in through the EMR, reviewed, and updated if appropriate.  Family History:  The patient's family history includes Stroke in his mother; Thyroid  disease in his father.  Medical History: Past Medical History:  Diagnosis Date  . Schizophrenia [facility])     Surgical History: Past Surgical History:  Procedure Laterality Date  . shunt surgery       Medications:  No current facility-administered medications for this encounter.  Current Outpatient Medications:  .  amantadine  (SYMMETREL ) 100 MG capsule, Take 100 mg by mouth 2 (two) times daily. (Patient not taking: Reported on 10/25/2022), Disp: , Rfl:  .  benztropine  (COGENTIN ) 1 MG tablet, Take 1 tablet (1 mg total) by mouth 2 (two) times daily., Disp: 60 tablet, Rfl: 0 .  haloperidol  (HALDOL ) 10 MG tablet, Take 1 tablet (10 mg total) by mouth 2 (two) times daily., Disp: 60 tablet, Rfl: 0 .  traZODone  (DESYREL ) 100 MG tablet, Take 100 mg by mouth at bedtime., Disp: , Rfl:   Allergies: Allergies  Allergen Reactions  . Shellfish-Derived Products Anaphylaxis  . Hydroxyzine  Other (See Comments)    Made the patient too lethargic (also may have caused Tardive dyskinesia)  . Mirtazapine  Other (See Comments)    Made the patient too lethargic (also may have caused Tardive dyskinesia)  . Olanzapine  Other (See Comments)    Made the patient too lethargic (also may have caused Tardive dyskinesia)  . Risperdal [Risperidone] Other (See Comments)    Tardive dyskinesia  . Temazepam  Other (See Comments)    Made the patient too lethargic (also may have caused Tardive dyskinesia)    Efrain DELENA Patient, NP

## 2023-10-15 NOTE — ED Notes (Signed)
 Patient was given scrubs to change and refused to change his top. Refused to take his shoes off. His belonging placed on the cabinets rm 23-25

## 2023-10-15 NOTE — BH Assessment (Signed)
 At 0554, IRIS deferred consult to day shift provider.    Jackson JONETTA Broach, MS, [facility], [facility] Triage Specialist [phone]

## 2023-10-15 NOTE — ED Notes (Signed)
 Patient laying in bed speaking to himself

## 2023-10-15 NOTE — ED Notes (Signed)
 Per security this pt is allowed to wear his scrubs over his clothes because pt refuses to remove clothes.

## 2023-10-15 NOTE — ED Notes (Signed)
 Pt's dad visiting at this time

## 2023-10-15 NOTE — ED Notes (Signed)
 Patient made aware of needing urine sample

## 2023-10-15 NOTE — ED Notes (Signed)
 Patient continues to try to leave. ER doctors IVCing patient.

## 2023-10-15 NOTE — ED Triage Notes (Addendum)
 Pt to ED via [facility] picked up from [facility].  Hx schizophrenia, not taking medications, A&Ox4, denies SI per EMS.  EMS states pt not forth rite with information.  On assessment patient is difficult to understand and mumbling, minimally answering questions, does deny SI or pain.  EMS vitals: 142/74 BP, 88 HR, CBG 106

## 2023-10-15 NOTE — ED Notes (Signed)
 Pt knife and corkscrew locked up by security.

## 2023-10-15 NOTE — Progress Notes (Addendum)
 Pt has been accepted to [facility] TOMORROW 10/16/2023  Main campus  Pt meets inpatient criteria per: Efrain Patient NP  Attending Physician will be Millie Manners, MD  Report can be called to: [phone] (this is a pager, please leave call-back number when giving report)  Pt can arrive after 8 AM  Care Team Notified: Efrain Patient NP, [facility],   [place] Insiya Oshea LCSW-A   10/15/2023 12:30 PM

## 2023-10-15 NOTE — ED Notes (Signed)
 Pt belongings moved to [place] [address] except knife and corkscrew which are still in security possession

## 2023-10-15 NOTE — ED Notes (Signed)
Pt dressed out and wanded

## 2023-10-15 NOTE — ED Notes (Signed)
 [name] with [facility] called back and got info from Clinical research associate on this pt. [name] advised he will be accepted by [facility] tomorrow.

## 2023-10-15 NOTE — Progress Notes (Signed)
 LCSW Progress Note  [id number]   RAFFAELE DERISE  10/15/2023  12:01 PM  Description:   Inpatient Psychiatric Referral  Patient was recommended inpatient per Efrain Patient (NP). There are no available beds at [facility], per [facility] Carlo RN). Patient was referred to the following out of network facilities:   Destination  Service Provider Address Phone Fax  [facility] EFAX  [address] [place], [place] [phone]  [facility]  [address] [place] [address] [phone]  [facility]  [address], [place] [address] [phone]  [facility]  [address] [place] [address] [phone]  [facility]  [address] [place] [address] [phone]      Situation ongoing, CSW to continue following and update chart as more information becomes available.      [place] Anabella Capshaw MSW, LCSW  10/15/2023 12:01 PM

## 2023-10-15 NOTE — ED Notes (Signed)
 Patient continues to try to leave and is walking down the hallway. And coming up to the nurses station. Patient has been given sandwich, soda and juice.

## 2023-10-15 NOTE — ED Notes (Signed)
 Pt was given materials to take a shower.

## 2023-10-15 NOTE — ED Provider Notes (Signed)
 [place] EMERGENCY DEPARTMENT AT [facility] Provider Note   CSN: [id number] Arrival date & time: 10/15/23  0206     Patient presents with: Mental Health Problem   [name] is a 43 y.o. male.   The history is provided by the patient and medical records.  Mental Health Problem  43 year old male with history of schizophrenia, tardive dyskinesia, presenting to the ED via EMS after being picked up from [employer]  [employer] [facility].  He was apparently wandering around there, would not tell anyone why he was there.  Patient states he was just hungry.  He continues mumbling nonsensical speech.  Denies SI but when asked about hallucinations, admits to hearing voices but they may just be yours.    Prior to Admission medications   Medication Sig Start Date End Date Taking? Authorizing Provider  amantadine  (SYMMETREL ) 100 MG capsule Take 100 mg by mouth 2 (two) times daily. Patient not taking: Reported on 10/25/2022 09/17/22   [provider]  benztropine  (COGENTIN ) 1 MG tablet Take 1 tablet (1 mg total) by mouth 2 (two) times daily. 10/08/22   Pashayan, Marsa RAMAN, DO  haloperidol  (HALDOL ) 10 MG tablet Take 1 tablet (10 mg total) by mouth 2 (two) times daily. 10/08/22   Raliegh Marsa RAMAN, DO  traZODone  (DESYREL ) 100 MG tablet Take 100 mg by mouth at bedtime.    [provider]    Allergies: Shellfish-derived products, Hydroxyzine , Mirtazapine , Olanzapine , Risperdal [risperidone], and Temazepam     Review of Systems  Psychiatric/Behavioral:  Positive for behavioral problems.   All other systems reviewed and are negative.   Updated Vital Signs BP (!) 119/92   Pulse (!) 110   Temp 97.7 F (36.5 C)   Resp 16   Ht 5' 11 (1.803 m)   Wt 65.8 kg   SpO2 99%   BMI 20.22 kg/m   Physical Exam Vitals and nursing note reviewed.  Constitutional:      Appearance: He is well-developed.  HENT:     Head: Normocephalic and atraumatic.  Eyes:      Conjunctiva/sclera: Conjunctivae normal.     Pupils: Pupils are equal, round, and reactive to light.  Cardiovascular:     Rate and Rhythm: Normal rate and regular rhythm.     Heart sounds: Normal heart sounds.  Pulmonary:     Effort: Pulmonary effort is normal.     Breath sounds: Normal breath sounds.  Abdominal:     General: Bowel sounds are normal.     Palpations: Abdomen is soft.  Musculoskeletal:        General: Normal range of motion.     Cervical back: Normal range of motion.  Skin:    General: Skin is warm and dry.  Neurological:     Mental Status: He is alert and oriented to person, place, and time.  Psychiatric:     Comments: Patient continuously mumbling to himself, somewhat nonsensical, denies SI     (all labs ordered are listed, but only abnormal results are displayed) Labs Reviewed  COMPREHENSIVE METABOLIC PANEL WITH GFR - Abnormal; Notable for the following components:      Result Value   Potassium 3.2 (*)    Chloride 97 (*)    Glucose, Bld 102 (*)    Anion gap 16 (*)    All other components within normal limits  CBC - Abnormal; Notable for the following components:   WBC 11.7 (*)    MCV 75.0 (*)  MCH 25.0 (*)    Platelets 423 (*)    All other components within normal limits  ETHANOL  URINE DRUG SCREEN    EKG: None  Radiology: No results found.   Procedures   Medications Ordered in the ED  potassium chloride  SA (KLOR-CON  M) CR tablet 40 mEq (has no administration in time range)                                    Medical Decision Making Amount and/or Complexity of Data Reviewed Labs: ordered. ECG/medicine tests: ordered and independent interpretation performed.  Risk Prescription drug management.   43 year old male presenting to the ED via EMS after he was found wandering on [facility].  He states he was hungry.  Patient is awake and alert, continuously mumbling to himself on stretcher.  He answers limited questions,  otherwise nonsensical rambling.  He is hemodynamically stable.  Labs as above--potassium is mildly low at 3.2, and oral replacement..  Renal function is normal.  Ethanol negative.  UDS pending.  Medically cleared, will get TTS consult.  5:59 AM Patient attempted to leave the ER.  He still is not making any sense while talking.  He continues rambling to himself.  I am concerned he is responding to internal stimuli.  It is apparent he is not currently on his medications and cannot reasonably contract for safety.  IVC has been filed.  Given dose of IM haldol .    TTS consult has been deferred to IRIS for later this AM.  Care will be signed out to oncoming provider to follow-up on recommendations.  Final diagnoses:  Schizoaffective disorder, unspecified type [facility])    ED Discharge Orders     None          Jarold Olam HERO, PA-C 10/15/23 9391    Theadore Ozell HERO, MD 10/15/23 [phone]

## 2023-10-15 NOTE — BH Assessment (Signed)
 Patient was deferred to IRIS for a telepsych assessment. The assigned care coordinator will provide updates regarding the scheduling of the assessment. IRIS coordinator can be reached at [phone] for further information on the timing of the telepsych evaluation.

## 2023-10-15 NOTE — ED Notes (Signed)
 Pt took a shower, given hygiene supplies and clean scrubs to put on. Pts gait is steady. NAD noted.

## 2023-10-15 NOTE — ED Notes (Addendum)
 Writer entered unit due to pt hitting buttons in the room. Pt was in street clothes with belt around waste. Writer promptly got pt dressed out in purple scrubs and placed belongings in locker 37

## 2023-10-15 NOTE — ED Notes (Signed)
 Writer returned call to [place] at [phone]

## 2023-10-16 NOTE — ED Notes (Signed)
 IVC CASE # [id number]

## 2023-10-16 NOTE — ED Notes (Signed)
 Message left on [facility] answer machine for need to transport pt to [facility] in AM.

## 2023-10-16 NOTE — ED Provider Notes (Signed)
 Emergency Medicine Observation Re-evaluation Note  [name] is a 43 y.o. male, seen on rounds today.  Pt initially presented to the ED for complaints of Mental Health Problem Currently, the patient is resting.  Physical Exam  BP 122/81 (BP Location: Left Arm)   Pulse 70   Temp 98.2 F (36.8 C) (Oral)   Resp 16   Ht 5' 11 (1.803 m)   Wt 65.8 kg   SpO2 100%   BMI 20.22 kg/m  Physical Exam General: nad Psych: calm  ED Course / MDM  EKG:EKG Interpretation Date/Time:  Wednesday October 15 2023 09:23:29 EDT Ventricular Rate:  100 PR Interval:  132 QRS Duration:  90 QT Interval:  374 QTC Calculation: 482 R Axis:   99  Text Interpretation: Normal sinus rhythm Rightward axis Prolonged QT Abnormal ECG When compared with ECG of 03-Jul-2023 22:47, No significant change was found Confirmed by Raford Lenis ([id number]) on 10/15/2023 11:25:25 PM  I have reviewed the labs performed to date as well as medications administered while in observation.  Recent changes in the last 24 hours include going to [place] later today.  Plan  Current plan is for placement at [facility].    Elnor Jayson LABOR, DO 10/16/23 [phone]

## 2023-10-16 NOTE — ED Notes (Signed)
 Patient off unit to [facility] per provider. Patient alert, cooperative, and no s/s of distress at time of discharge. Discharge belongings and belongings  given to [facility] for transport. Patient ambulatory off unit,escorted and transported by sheriff.

## 2023-10-16 NOTE — Discharge Instructions (Signed)
 RESOURCE GUIDE  Chronic Pain Problems: Contact [facility]  [phone] Patients need to be referred by their primary care doctor.  Insufficient Money for Medicine: Contact [employer]:  call [phone]  No Primary Care Doctor: Call [facility]  [phone] - can help you locate a primary care doctor that  accepts your insurance, provides certain services, etc. Physician Referral Service- [phone]  Agencies that provide inexpensive medical care: [facility]  [phone] [facility]  [phone] [facility]  [phone] [facility]  [phone] [facility]  [phone] [facility]  [phone]  Medicaid-accepting [facility] Providers: [facility]- [address]  [phone], Mon-Fri 9am-7pm, Sat 9am-1pm [facility]- [address] [place], [address] [place]  [phone] [facility]- [address], [address] [place]  [phone] [facility]- [address]  [phone] Renaye Rakers- [address] [place], [address], [phone]  Only accepts [place] patients after they have their name  applied to their card  Self Pay (no insurance) in [facility]: Sickle Cell Patients - [facility]  [address] [place], [phone] [facility]- [address] [place]  [phone]       [facility]- [address]       -     [facility]- see information above (Speak to [employer] if you do not have insurance)       -  [facility]- [address],  [phone]       -  [facility]- [address] [phone]       -  Dr Julio Sicks-  [address], [place], [phone]       -  [facility] - [address] [phone]       -  [facility]- [address] [phone], also [address],  [phone]       -     [facility]- [address] [place], [phone], 1st & 3rd Saturday         every month, 10am-1pm  -     [facility]   [address] [place], [place].   Phone:  [phone], Fax:  [phone]. Hours of Operation:  9 am - 6 pm, M-F.  -     [facility]   [address], [place]   Phone: [phone], Fax: [phone]. Hours of Operation:  8:30 am - 5:30 pm, M-F.    Dental Assistance If unable to pay or uninsured, contact:  [facility]. to become qualified for the adult dental clinic.  Patients with Medicaid: [facility] [phone] [address], [phone] [address], [phone]  If unable to pay, or uninsured, contact [facility] [phone] in [place], [phone] in [facility]) to become qualified for the adult dental clinic  [facility] [address] [place], [place] [address] [phone] [email or web address]  Other Proofreader Services: [facility]- [address] [place], [place], [place], [address], [phone], Ext. [phone], 2nd and 4th Thursday of the month at 6:30am.  10 clients each day by appointment, can sometimes see walk-in patients if someone does not show for an appointment.  [facility]- [address] [place], [place], [address], [phone] [facility] [address] [place], [place], [address], [phone] [facility] Health Department- [phone] [facility] Health Department- [phone] [facility] [phone]

## 2023-12-01 ENCOUNTER — Ambulatory Visit (INDEPENDENT_AMBULATORY_CARE_PROVIDER_SITE_OTHER): Payer: MEDICAID | Admitting: Podiatry

## 2023-12-01 ENCOUNTER — Encounter: Payer: Self-pay | Admitting: Podiatry

## 2023-12-01 DIAGNOSIS — M79675 Pain in left toe(s): Secondary | ICD-10-CM | POA: Diagnosis not present

## 2023-12-01 DIAGNOSIS — B351 Tinea unguium: Secondary | ICD-10-CM | POA: Diagnosis not present

## 2023-12-01 DIAGNOSIS — M79674 Pain in right toe(s): Secondary | ICD-10-CM

## 2023-12-03 NOTE — Progress Notes (Signed)
 Subjective:   Patient ID: [name], male   DOB: 43 y.o.   MRN: [id number]   HPI Chief Complaint  Patient presents with   Nail Problem    np left foot toenails thick and curved, RFC   43 year old male presents the office today with concerns of significantly elongated, thickened toenails that he is not able to trim himself.  The area is tender.  Denies any swelling, redness or any drainage.  No open lesions.  No injuries.  No other concerns.   Review of Systems  All other systems reviewed and are negative.  Past Medical History:  Diagnosis Date   Schizophrenia [facility])     Past Surgical History:  Procedure Laterality Date   shunt surgery       Current Outpatient Medications:    benztropine  (COGENTIN ) 1 MG tablet, Take 1 tablet (1 mg total) by mouth 2 (two) times daily., Disp: 60 tablet, Rfl: 0   divalproex  (DEPAKOTE ) 500 MG DR tablet, Take 500 mg by mouth 2 (two) times daily., Disp: , Rfl:   Allergies  Allergen Reactions   Shellfish Allergy Anaphylaxis   Remeron  [Mirtazapine ] Other (See Comments)    Lethargy  Possible TD   Restoril  [Temazepam ] Other (See Comments)    Lethargy  Possible TD   Risperdal [Risperidone] Other (See Comments)    Tardive dyskinesia   Vistaril  [Hydroxyzine ] Other (See Comments)    Lethargy  Possible TD   Zyprexa  [Olanzapine ] Other (See Comments)    Lethargy  Possible TD          Objective:  Physical Exam  General: AAO x3, NAD  Dermatological: Nails are hypertrophic, dystrophic, brittle, discolored, elongated 10.  In particular hallux close the most significantly hypertrophic and dystrophic and is curving towards the second toes abutting the toe causing a preulcerative lesion.  No surrounding redness or drainage. Tenderness nails 1-5 bilaterally. No open lesions or pre-ulcerative lesions are identified today.  Vascular: Dorsalis Pedis artery and Posterior Tibial artery pedal pulses are 2/4 bilateral with immedate capillary fill time.  There is no pain with calf compression, swelling, warmth, erythema.   Neruologic: Grossly intact via light touch bilateral.   Musculoskeletal: No gross boney pedal deformities bilateral.        Assessment:   43 year old male with significant hyper trophic toenails, onychomycosis     Plan:  -Treatment options discussed including all alternatives, risks, and complications -Etiology of symptoms were discussed - She will be debrided nails x 10 without any complications or bleeding.  Recommend routine debridement.  Discussed options for nail fungus treatment but I think routine debridement is best option at this time given the significant dystrophy.  Return in about 3 months (around 03/02/2024) for nail trim.  [name] DPM

## 2024-03-03 ENCOUNTER — Ambulatory Visit: Payer: MEDICAID | Admitting: Podiatry
